# Patient Record
Sex: Male | Born: 1985 | Race: Black or African American | Hispanic: No | Marital: Single | Smoking: Never smoker
Health system: Southern US, Community
[De-identification: ages and names within clinical notes are randomized; demographics above are authoritative.]

## PROBLEM LIST (undated history)

## (undated) DIAGNOSIS — F29 Unspecified psychosis not due to a substance or known physiological condition: Secondary | ICD-10-CM

## (undated) DIAGNOSIS — G4730 Sleep apnea, unspecified: Secondary | ICD-10-CM

## (undated) DIAGNOSIS — F819 Developmental disorder of scholastic skills, unspecified: Secondary | ICD-10-CM

## (undated) DIAGNOSIS — F419 Anxiety disorder, unspecified: Secondary | ICD-10-CM

## (undated) DIAGNOSIS — K219 Gastro-esophageal reflux disease without esophagitis: Secondary | ICD-10-CM

## (undated) DIAGNOSIS — F329 Major depressive disorder, single episode, unspecified: Secondary | ICD-10-CM

## (undated) DIAGNOSIS — E669 Obesity, unspecified: Secondary | ICD-10-CM

## (undated) DIAGNOSIS — F060 Psychotic disorder with hallucinations due to known physiological condition: Secondary | ICD-10-CM

## (undated) DIAGNOSIS — R51 Headache: Secondary | ICD-10-CM

## (undated) DIAGNOSIS — F988 Other specified behavioral and emotional disorders with onset usually occurring in childhood and adolescence: Secondary | ICD-10-CM

## (undated) DIAGNOSIS — B354 Tinea corporis: Secondary | ICD-10-CM

## (undated) DIAGNOSIS — K2970 Gastritis, unspecified, without bleeding: Secondary | ICD-10-CM

## (undated) DIAGNOSIS — Z9289 Personal history of other medical treatment: Secondary | ICD-10-CM

## (undated) DIAGNOSIS — E041 Nontoxic single thyroid nodule: Secondary | ICD-10-CM

## (undated) DIAGNOSIS — F32A Depression, unspecified: Secondary | ICD-10-CM

## (undated) DIAGNOSIS — Z973 Presence of spectacles and contact lenses: Secondary | ICD-10-CM

## (undated) HISTORY — DX: Gastro-esophageal reflux disease without esophagitis: K21.9

## (undated) HISTORY — DX: Major depressive disorder, single episode, unspecified: F32.9

## (undated) HISTORY — DX: Depression, unspecified: F32.A

## (undated) HISTORY — DX: Other specified behavioral and emotional disorders with onset usually occurring in childhood and adolescence: F98.8

## (undated) HISTORY — DX: Anxiety disorder, unspecified: F41.9

## (undated) HISTORY — DX: Presence of spectacles and contact lenses: Z97.3

## (undated) HISTORY — DX: Developmental disorder of scholastic skills, unspecified: F81.9

## (undated) HISTORY — DX: Personal history of other medical treatment: Z92.89

## (undated) HISTORY — DX: Sleep apnea, unspecified: G47.30

## (undated) HISTORY — DX: Tinea corporis: B35.4

## (undated) HISTORY — DX: Gastritis, unspecified, without bleeding: K29.70

## (undated) HISTORY — DX: Obesity, unspecified: E66.9

## (undated) HISTORY — DX: Headache: R51

---
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
## 2006-06-30 ENCOUNTER — Ambulatory Visit: Payer: Self-pay | Admitting: Family Medicine

## 2006-08-05 ENCOUNTER — Ambulatory Visit: Payer: Self-pay | Admitting: Family Medicine

## 2006-10-21 ENCOUNTER — Ambulatory Visit: Payer: Self-pay | Admitting: Family Medicine

## 2006-12-22 ENCOUNTER — Ambulatory Visit: Payer: Self-pay | Admitting: Family Medicine

## 2007-01-15 ENCOUNTER — Ambulatory Visit: Payer: Self-pay | Admitting: Family Medicine

## 2007-03-24 ENCOUNTER — Ambulatory Visit: Payer: Self-pay | Admitting: Family Medicine

## 2007-04-13 ENCOUNTER — Ambulatory Visit: Payer: Self-pay | Admitting: Family Medicine

## 2007-07-30 ENCOUNTER — Ambulatory Visit: Payer: Self-pay | Admitting: Family Medicine

## 2007-10-14 ENCOUNTER — Ambulatory Visit: Payer: Self-pay | Admitting: Family Medicine

## 2007-10-19 DIAGNOSIS — Z9289 Personal history of other medical treatment: Secondary | ICD-10-CM

## 2007-10-19 HISTORY — DX: Personal history of other medical treatment: Z92.89

## 2007-11-09 ENCOUNTER — Ambulatory Visit (HOSPITAL_COMMUNITY): Admission: RE | Admit: 2007-11-09 | Discharge: 2007-11-09 | Payer: Self-pay | Admitting: Cardiovascular Disease

## 2007-11-09 ENCOUNTER — Ambulatory Visit: Payer: Self-pay | Admitting: Cardiovascular Disease

## 2007-11-12 ENCOUNTER — Encounter: Payer: Self-pay | Admitting: Cardiovascular Disease

## 2007-11-12 ENCOUNTER — Ambulatory Visit: Payer: Self-pay

## 2008-02-10 ENCOUNTER — Ambulatory Visit: Payer: Self-pay | Admitting: Family Medicine

## 2008-04-21 ENCOUNTER — Encounter: Admission: RE | Admit: 2008-04-21 | Discharge: 2008-04-21 | Payer: Self-pay | Admitting: Family Medicine

## 2008-06-14 ENCOUNTER — Ambulatory Visit: Payer: Self-pay | Admitting: Family Medicine

## 2008-09-06 ENCOUNTER — Ambulatory Visit: Payer: Self-pay | Admitting: Family Medicine

## 2008-10-13 ENCOUNTER — Ambulatory Visit: Payer: Self-pay | Admitting: Family Medicine

## 2009-02-21 ENCOUNTER — Ambulatory Visit: Payer: Self-pay | Admitting: Family Medicine

## 2009-03-14 ENCOUNTER — Ambulatory Visit: Payer: Self-pay | Admitting: Family Medicine

## 2009-05-22 ENCOUNTER — Ambulatory Visit: Payer: Self-pay | Admitting: Family Medicine

## 2010-02-01 ENCOUNTER — Ambulatory Visit: Payer: Self-pay | Admitting: Physician Assistant

## 2010-05-23 ENCOUNTER — Ambulatory Visit
Admission: RE | Admit: 2010-05-23 | Discharge: 2010-05-23 | Payer: Self-pay | Source: Home / Self Care | Attending: Family Medicine | Admitting: Family Medicine

## 2010-07-04 ENCOUNTER — Ambulatory Visit (INDEPENDENT_AMBULATORY_CARE_PROVIDER_SITE_OTHER): Payer: Federal, State, Local not specified - PPO | Admitting: Family Medicine

## 2010-07-04 DIAGNOSIS — R5381 Other malaise: Secondary | ICD-10-CM

## 2010-10-02 NOTE — Assessment & Plan Note (Signed)
[facility]                            CARDIOLOGY OFFICE NOTE   NAME:[name]                   MRN:          [id number]  DATE:11/09/2007                            DOB:          1985-11-18    A 25 year old patient referred by Dr. Meredeth Ide for chest pain, question  low heart rate, and dizziness.   The patient has no previously documented congenital or cardiac disease.  He has a history of probable adult-onset attention deficit disorder.   He has been on increasing doses of Focalin.   The patient has been having some dizziness.  It is non postural.  He  points towards his head and indicates that he has swimmy headed feeling.  He has not had any significant passing out spells and they are not  associated with palpitations.  He also gets atypical chest pain.  He  describes a grabbing sensation over his left prepectoral area.  In fact,  while he was in the room, he basically clutched at his chest and  indicated he was having a spasm.   The pain has been going on for a few months.  He thinks it may have been  exacerbated by the Focalin.   They clearly are not exertional related.   He also gets occasional exertional dyspnea which seems functional.  He  is a nonsmoker.  He has no cough, no sputum production, and no history  of chronic lung disease.  However, his father thinks that both the  patient's symptoms and his own have been made worse since they moved to  a new apartment in April and it is not clear if they have had any toxic  exposure or exposure to mold.   REVIEW OF SYSTEMS:  Otherwise negative.   I reviewed his EKG done at Dr. Reita Cliche office.  He basically had sinus  bradycardia with no heart block at a rate of 52.  There were no  abnormalities.  His PR interval was 188 milliseconds, QRS was 96  milliseconds, and QT was 380.   I told the patient I thought this was a normal EKG.   PAST MEDICAL HISTORY:  Otherwise remarkable  for attention deficit  disorder.  He has had constipation, fatigue, and seasonal allergies.  He  has not had any previous surgeries.   SOCIAL HISTORY:  The patient is single.  He lives with his father.  He  described himself as a Neurosurgeon.  He seems to have difficulty  with school.  He says he is going back there in the fall.  He does not  smoke or drink.  Mother and father are still alive without premature  congenital heart disease.   MEDICATION:  His only meds include Focalin 20 mg a day.   PHYSICAL EXAMINATION:  GENERAL:  Remarkable for an overweight black  male, in no distress.  Affect is somewhat unusual.  VITAL SIGNS:  His weight is 282, blood pressure 124/78, pulse 82 and  regular, respiratory rate 14, and afebrile.  HEENT:  Unremarkable.  NECK:  Carotids are normal without bruit.  No lymphadenopathy,  thyromegaly,  or JVP elevation.  LUNGS:  Clear with good diaphragmatic motion.  No wheezing.  CARDIAC: S1 and S2 with normal heart sounds.  PMI normal.  ABDOMEN:  Benign.  Bowel sounds positive.  No AAA.  No bruit.  No  hepatosplenomegaly.  No hepatojugular reflux and tenderness.  EXTREMITIES: Distal pulses intact. No edema.  NEURO:  Nonfocal.  SKIN:  Warm and dry.  No muscular weakness.   EKG is normal with a heart rate of 64.   IMPRESSION:  1. Relative bradycardia, not significant.  No evidence of heart block.      We will do a treadmill test on the patient to make sure there is no      chronotropic incompetence, but I do not think he has any abnormal      bradycardia.  2. Attention deficit disorder.  I think this may have exacerbated the      tightness in his chest.  We will leave it up to his primary care MD      to see if there are alternatives to an amphetamine-based compound.      He will have a stress echo to rule out structural heart disease and      make sure there is no possibility of congenital arterial disease.  3. Dizziness, again unlikely related to  his heart.  No evidence of      significant arrhythmias.  A 2-D echocardiogram to rule out occult      hypertrophic cardiomyopathy or other structural heart disease.   Since he was having symptoms referable to his chest which were  asymptomatic, I  think we will also do a screening chest x-ray.  He has  not had one in quite some time and I would like to make sure that there  is no other evidence of problems such as sarcoid or hilar adenopathy.   As long as his chest x-ray and stress echo were normal, he will be seen  on an as-needed basis and follow up with Dr. Meredeth Ide for his attention  deficit disorder.     Noralyn Pick. Eden Emms, MD, [facility]  Electronically Signed    PCN/MedQ  DD: 11/09/2007  DT: 11/10/2007  Job #: [id number]

## 2010-10-10 ENCOUNTER — Ambulatory Visit (INDEPENDENT_AMBULATORY_CARE_PROVIDER_SITE_OTHER): Payer: Federal, State, Local not specified - PPO | Admitting: Medical

## 2010-10-10 ENCOUNTER — Encounter: Payer: Self-pay | Admitting: Medical

## 2010-10-10 VITALS — BP 132/72 | HR 72 | Temp 97.5°F | Ht 73.0 in | Wt 261.0 lb

## 2010-10-10 DIAGNOSIS — R358 Other polyuria: Secondary | ICD-10-CM

## 2010-10-10 DIAGNOSIS — R3589 Other polyuria: Secondary | ICD-10-CM

## 2010-10-10 DIAGNOSIS — R632 Polyphagia: Secondary | ICD-10-CM

## 2010-10-10 DIAGNOSIS — J029 Acute pharyngitis, unspecified: Secondary | ICD-10-CM

## 2010-10-10 DIAGNOSIS — R631 Polydipsia: Secondary | ICD-10-CM

## 2010-10-10 LAB — POCT URINALYSIS DIPSTICK
Bilirubin, UA: NEGATIVE
Leukocytes, UA: NEGATIVE
Nitrite, UA: NEGATIVE
Spec Grav, UA: 1.015
Urobilinogen, UA: 4

## 2010-10-10 LAB — POCT RAPID STREP A (OFFICE): Rapid Strep A Screen: NEGATIVE

## 2010-10-10 NOTE — Progress Notes (Signed)
Subjective:     [name] is a 25 y.o. male who presents for evaluation of sore throat. Associated symptoms include dry cough, post nasal drip, sore throat and Nasal congestion. Onset of symptoms was 4 days ago, and have been unchanged since that time. He is drinking plenty of fluids. He has not had a recent close exposure to someone with proven streptococcal pharyngitis.  He also has a concern that lately he has been very hungry, he has had increased urination, increased bowel movement, increased thirst and hunger.  He is under stress.  He is treated by Dr. Betti Cruz for depression.    He also brought forms that need signing and sent to his college.    The following portions of the patient's history were reviewed and updated as appropriate: allergies, current medications, past family history, past medical history, past social history, past surgical history and problem list.  Review of Systems Constitutional: denies fever, chills, sweats, unexpected weight change, anorexia, fatigue Allergy: denies recent sneezing, itching Dermatology: denies rash ENT: no ear pain, sinus pain, teeth pain Cardiology: denies chest pain, palpitations Respiratory: denies shortness of breath, wheezing,  Gastroenterology: denies abdominal pain, nausea, vomiting, diarrhea Musculoskeletal: denies arthralgias, myalgias, joint swelling, back pain, neck pain Ophthalmology: denies eye redness, itching, discharge    Objective:     Filed Vitals:   10/10/10 1355  BP: 132/72  Pulse: 72  Temp: 97.5 F (36.4 C)    General appearance: no distress, WD/WN HEENT: normocephalic, conjunctiva/corneas normal, sclerae anicteric, nares patent, no discharge or erythema, pharynx with mild erythema, no exudate.  Oral cavity: MMM, no lesions  Neck: supple, no lymphadenopathy, no thyromegaly Heart: RRR, normal S1, S2, no murmurs Lungs: CTA bilaterally, no wheezes, rhonchi, or rales Abdomen: +bs, soft, non tender, non  distended, no masses, no hepatomegaly, no splenomegaly Musculoskeletal: non tender, no obvious deformity of extremities  Laboratory Strep test done. Results:negative.    Assessment:   Encounter Diagnoses  Name Primary?  . Pharyngitis Yes  . Polyuria   . Polydipsia   . Polyphagia     Plan:     Pharyngitis - Advised that symptoms and exam suggest a viral etiology.  Discussed symptomatic treatment including salt water gargles, warm fluids, rest, hydrate well, can use over-the-counter Tylenol for throat pain, fever, or malaise. If worse or not improving within 2-3 days, call or return.   Polyuria, polydipsia, polyphagia - advised that his HgbA1C diabetes screen was not diagnostic of diabetes, no glucosuria in the urinalysis.  Advised he try and use more caution on his oral intake.   He is over eating in general, and he also eats with stressors.  He has seen [facility] prior.    I completed his forms today for school.  We will fax the forms as well to the appropriate contact.

## 2011-01-12 ENCOUNTER — Emergency Department (HOSPITAL_COMMUNITY)
Admission: EM | Admit: 2011-01-12 | Discharge: 2011-01-12 | Disposition: A | Discharge status: Home / Self Care | Payer: BC Managed Care – PPO | Attending: Emergency Medicine | Admitting: Emergency Medicine

## 2011-01-12 DIAGNOSIS — R21 Rash and other nonspecific skin eruption: Secondary | ICD-10-CM | POA: Insufficient documentation

## 2011-01-12 DIAGNOSIS — R51 Headache: Secondary | ICD-10-CM | POA: Insufficient documentation

## 2011-01-14 ENCOUNTER — Encounter: Payer: Self-pay | Admitting: Family Medicine

## 2011-01-14 ENCOUNTER — Ambulatory Visit (INDEPENDENT_AMBULATORY_CARE_PROVIDER_SITE_OTHER): Payer: BC Managed Care – PPO | Admitting: Family Medicine

## 2011-01-14 VITALS — BP 120/80 | HR 75 | Temp 98.5°F | Wt 263.0 lb

## 2011-01-14 DIAGNOSIS — L0390 Cellulitis, unspecified: Secondary | ICD-10-CM

## 2011-01-14 DIAGNOSIS — L0291 Cutaneous abscess, unspecified: Secondary | ICD-10-CM

## 2011-01-14 NOTE — Patient Instructions (Signed)
Use Neosporin ointment on this as needed.

## 2011-01-14 NOTE — Progress Notes (Signed)
  Subjective:    Patient ID: [name], male    DOB: 07-14-1985, 25 y.o.   MRN: [id number]  HPI Approximately 3 days ago he noted spine and of the lower lip as well as some irritation and redness. He states that the area is getting better. He notes no other oral lesions or lesions on the penis.   Review of Systems     Objective:   Physical Exam Alert and in no distress. Lower lip is edematous, erythematous and shows evidence of healing. Exam of the wrist the oral mucosa was entirely normal. Neck is supple without adenopathy.       Assessment & Plan:  Healing lip lesion, etiology unclear Since he is doing well I will recommend using an antibiotic ointment on this and call if further trouble.

## 2011-02-07 ENCOUNTER — Telehealth: Payer: Self-pay | Admitting: Family Medicine

## 2011-02-07 MED ORDER — LISDEXAMFETAMINE DIMESYLATE 60 MG PO CAPS: 60.0000 mg | ORAL_CAPSULE | ORAL | Status: DC

## 2011-02-07 NOTE — Telephone Encounter (Signed)
Vyvanse prescription renewed

## 2011-02-08 ENCOUNTER — Telehealth: Payer: Self-pay | Admitting: Family Medicine

## 2011-02-11 NOTE — Telephone Encounter (Signed)
LEFT MESSAGE

## 2011-02-12 ENCOUNTER — Encounter: Payer: Self-pay | Admitting: Family Medicine

## 2011-02-15 ENCOUNTER — Ambulatory Visit (INDEPENDENT_AMBULATORY_CARE_PROVIDER_SITE_OTHER): Payer: BC Managed Care – PPO | Admitting: Family Medicine

## 2011-02-15 ENCOUNTER — Encounter: Payer: Self-pay | Admitting: Family Medicine

## 2011-02-15 VITALS — BP 120/80 | HR 64 | Wt 264.0 lb

## 2011-02-15 DIAGNOSIS — F909 Attention-deficit hyperactivity disorder, unspecified type: Secondary | ICD-10-CM

## 2011-02-15 MED ORDER — LISDEXAMFETAMINE DIMESYLATE 70 MG PO CAPS: 70.0000 mg | ORAL_CAPSULE | ORAL | Status: DC

## 2011-02-15 NOTE — Progress Notes (Signed)
  Subjective:    Patient ID: [name], male    DOB: 1986-02-06, 25 y.o.   MRN: [id number]  HPI He is here to discuss his ADD medication. It is difficult to get a good history from him but apparently the Diovan/only about 4 hours. He states that he has difficulty remembering things which interferes with his course work.  Review of Systems     Objective:   Physical Exam Alert and in no distress otherwise not examined       Assessment & Plan:  ADD I will increase his Vyvanse. Discussed the possibility of giving him Adderall to help if he needs an extra boost to help with focused. Also discussed the need for him to do all of his course work and studying in the time frame that the Vyvanse is in the system. He will call me next week.

## 2011-02-15 NOTE — Patient Instructions (Signed)
Try the new dosing and let me know if it works, how long does it work and are you having trouble

## 2011-04-04 ENCOUNTER — Telehealth: Payer: Self-pay | Admitting: Family Medicine

## 2011-04-04 MED ORDER — LISDEXAMFETAMINE DIMESYLATE 70 MG PO CAPS: 70.0000 mg | ORAL_CAPSULE | ORAL | Status: DC

## 2011-04-04 NOTE — Telephone Encounter (Signed)
VYVANSE RENEWED

## 2011-05-02 ENCOUNTER — Telehealth: Payer: Self-pay | Admitting: Family Medicine

## 2011-05-03 NOTE — Telephone Encounter (Signed)
LETTER GIVEN TO PT

## 2011-05-06 ENCOUNTER — Encounter: Payer: Self-pay | Admitting: Family Medicine

## 2011-05-24 ENCOUNTER — Ambulatory Visit (INDEPENDENT_AMBULATORY_CARE_PROVIDER_SITE_OTHER): Payer: Medicaid Other | Admitting: Medical

## 2011-05-24 ENCOUNTER — Encounter: Payer: Self-pay | Admitting: Medical

## 2011-05-24 VITALS — BP 122/80 | Temp 97.5°F | Wt 270.0 lb

## 2011-05-24 DIAGNOSIS — R05 Cough: Secondary | ICD-10-CM

## 2011-05-24 DIAGNOSIS — R059 Cough, unspecified: Secondary | ICD-10-CM | POA: Insufficient documentation

## 2011-05-24 DIAGNOSIS — J029 Acute pharyngitis, unspecified: Secondary | ICD-10-CM

## 2011-05-24 LAB — POCT RAPID STREP A (OFFICE): Rapid Strep A Screen: NEGATIVE

## 2011-05-24 MED ORDER — HYDROCODONE-HOMATROPINE 5-1.5 MG/5ML PO SYRP: 5.0000 mL | ORAL_SOLUTION | Freq: Four times a day (QID) | ORAL | Status: DC | PRN

## 2011-05-24 NOTE — Progress Notes (Signed)
Subjective:  [name] is a 26 y.o. male who presents for c/o 3 week hx/o sore throat, swollen throat, and been using Theraflu and liquid Tylenol.  Denies fever.  Is coughing a lot, up all night coughing.  Denies head congestion or ear pain, but does feel congestion and pain in neck.   No sick contacts.  He has not had a recent close exposure to someone with proven streptococcal pharyngitis.  Past Medical History  Diagnosis Date  . Chronic headaches   . Learning disability   . ADD (attention deficit disorder)   . Depression    ROS: Gen: no fever, chills, wt loss HEENT: no ear pain, headache or sinus pressure GI: no abdominal pain, N/V/D   Objective:      Filed Vitals:   05/24/11 1350  BP: 122/80  Temp: 97.5 F (36.4 C)    General appearance: no distress, WD/WN HEENT: normocephalic, conjunctiva/corneas normal, sclerae anicteric, nares patent, no discharge or erythema, pharynx with erythema, +post nasal drainage  Oral cavity: MMM, no lesions  Neck: mildly tender generalized, otherwise supple, no lymphadenopathy, no thyromegaly Heart: RRR, normal S1, S2, no murmurs Lungs: CTA bilaterally, no wheezes, rhonchi, or rales  Laboratory Strep test done. Results:negative.    Assessment and Plan:   Encounter Diagnoses  Name Primary?  . Pharyngitis Yes  . Cough     Advised that symptoms and exam suggest cough and sore throat from post nasal drip.  Begin Zyrtec 10mg  QHS OTC.  Script for Hycodan for cough.  Advised salt water gargles, warm fluids, rest, hydrate well, can use over-the-counter Tylenol or Ibuprofen for throat pain, fever, or malaise. If worse or not improving within 2-3 days, call or return. Otherwise call report 1 wk.

## 2011-05-24 NOTE — Patient Instructions (Signed)

## 2011-06-18 ENCOUNTER — Telehealth: Payer: Self-pay | Admitting: Internal Medicine

## 2011-06-18 MED ORDER — LISDEXAMFETAMINE DIMESYLATE 70 MG PO CAPS: 70.0000 mg | ORAL_CAPSULE | ORAL | Status: DC

## 2011-06-18 NOTE — Telephone Encounter (Signed)
Vyvanse was renewed

## 2011-06-19 ENCOUNTER — Telehealth: Payer: Self-pay | Admitting: Family Medicine

## 2011-06-19 NOTE — Telephone Encounter (Signed)
Talked with father he said ok

## 2011-06-19 NOTE — Telephone Encounter (Signed)
Review of his record indicates he shouldn't be on the medication and I cannot give him another prescription

## 2011-06-20 ENCOUNTER — Other Ambulatory Visit: Payer: Self-pay | Admitting: Family Medicine

## 2011-06-20 MED ORDER — LISDEXAMFETAMINE DIMESYLATE 70 MG PO CAPS: 70.0000 mg | ORAL_CAPSULE | ORAL | Status: DC

## 2011-06-20 NOTE — Progress Notes (Signed)
His last Vyvanse prescription was filled November 16. Apparently he did not fill the other medications that would have covered him until February 15. Since he is now presently out, I will cover him with a 15 day supply and have already written for the next 3 months.

## 2011-06-24 ENCOUNTER — Telehealth: Payer: Self-pay | Admitting: Family Medicine

## 2011-06-24 NOTE — Telephone Encounter (Signed)
SPOKE WITH PT & INFORMED RX'S VYVANSE READY TO PICK UP

## 2012-01-07 ENCOUNTER — Ambulatory Visit (INDEPENDENT_AMBULATORY_CARE_PROVIDER_SITE_OTHER): Payer: BC Managed Care – PPO | Admitting: Medical

## 2012-01-07 ENCOUNTER — Encounter: Payer: Self-pay | Admitting: Medical

## 2012-01-07 VITALS — BP 120/88 | HR 92 | Temp 98.0°F | Resp 18 | Wt 265.0 lb

## 2012-01-07 DIAGNOSIS — B350 Tinea barbae and tinea capitis: Secondary | ICD-10-CM

## 2012-01-07 DIAGNOSIS — F988 Other specified behavioral and emotional disorders with onset usually occurring in childhood and adolescence: Secondary | ICD-10-CM

## 2012-01-07 DIAGNOSIS — J069 Acute upper respiratory infection, unspecified: Secondary | ICD-10-CM

## 2012-01-07 DIAGNOSIS — L738 Other specified follicular disorders: Secondary | ICD-10-CM

## 2012-01-07 DIAGNOSIS — R21 Rash and other nonspecific skin eruption: Secondary | ICD-10-CM

## 2012-01-07 MED ORDER — LISDEXAMFETAMINE DIMESYLATE 70 MG PO CAPS: 70.0000 mg | ORAL_CAPSULE | ORAL | Status: DC

## 2012-01-07 MED ORDER — CLINDAMYCIN PHOSPHATE 1 % EX GEL: Freq: Two times a day (BID) | CUTANEOUS | Status: DC

## 2012-01-07 NOTE — Progress Notes (Signed)
Subjective:  [name] is a 26 y.o. male who presents for illness.  He reports 3-4 days of cough, sneezing, sore throat, subjective fever, some ear pain, sinus pressure, but denies NVD.  Cough is productive of yellow sputum.   Using nothing for symptoms.  Feels fatigue, chest and head feels achy.  No chills.  Nephew recently diagnosed with strep but he was only around him briefly.  No other aggravating or relieving factors.    He c/o rash on face and both sides of nose.  Using nothing for this.  He needs refill on his ADD medication.  He is doing fine on this, he is in school and this helps with focus and attention.  Past Medical History  Diagnosis Date  . Chronic headaches   . Learning disability   . ADD (attention deficit disorder)   . Depression    ROS as noted in HPI   Objective:   Filed Vitals:   01/07/12 1529  BP: 120/88  Pulse: 92  Temp: 98 F (36.7 C)  Resp: 18    General appearance: Alert, WD/WN, no distress, mildly ill appearing                             Skin: neck with diffuse rough texture and numerous papular lesions suggestive of folliculitis, bilat cheeks and face just lateral to nose with scattered round areas of hypopigmented                            Head: no sinus tenderness                            Eyes: conjunctiva normal, corneas clear, PERRLA                            Ears: pearly TMs, external ear canals normal                          Nose: septum midline, turbinates swollen, with erythema and clear discharge             Mouth/throat: MMM, tongue normal, mild pharyngeal erythema                           Neck: supple, no adenopathy, no thyromegaly, nontender                          Heart: RRR, normal S1, S2, no murmurs                         Lungs: CTA bilaterally, no wheezes, rales, or rhonchi     Assessment and Plan:   Encounter Diagnoses  Name Primary?  . URI (upper respiratory infection) Yes  . Folliculitis barbae   . Rash     . ADD (attention deficit disorder)     URI - Discussed diagnosis and treatment of URI.  Suggested symptomatic OTC remedies.  Nasal saline spray for congestion.  Tylenol or Ibuprofen OTC for fever and malaise.  Call/return in 2-3 days if symptoms aren't resolving.   Folliculitis barbae - begin BID soap wash and rinse, script for Clindamycin gel, recheck 3mo  Rash - possible tinea  corporis.  Advised OTC selsun blue shampoo to lather in scalp and on face and torso, leave in , rinse out, can repeat this regimen twice weekly.  If not improving in a week.    ADD - doing well on current medication.   Refilled Vyvanse 70mg  daily.

## 2012-01-07 NOTE — Patient Instructions (Signed)
Upper Respiratory Infection, Adult An upper respiratory infection (URI) is also known as the common cold. It is often caused by a type of germ (virus). Colds are easily spread (contagious). You can pass it to others by kissing, coughing, sneezing, or drinking out of the same glass. Usually, you get better in 1 or 2 weeks.  HOME CARE   Only take medicine as told by your doctor.   Use a warm mist humidifier or breathe in steam from a hot shower.   Drink enough water and fluids to keep your pee (urine) clear or pale yellow.   Get plenty of rest.   Return to work when your temperature is back to normal or as told by your doctor. You may use a face mask and wash your hands to stop your cold from spreading.   Consider Mucinex DM OTC for cough and congestion  Ibuprofen for fever, throat pain and aches  Nasal saline for nasal congestion  Salt water gargles GET HELP RIGHT AWAY IF:   After the first few days, you feel you are getting worse.   You have questions about your medicine.   You have chills, shortness of breath, or brown or red spit (mucus).   You have yellow or brown snot (nasal discharge) or pain in the face, especially when you bend forward.   You have a fever, puffy (swollen) neck, pain when you swallow, or white spots in the back of your throat.   You have a bad headache, ear pain, sinus pain, or chest pain.   You have a high-pitched whistling sound when you breathe in and out (wheezing).   You have a lasting cough or cough up blood.   You have sore muscles or a stiff neck.  MAKE SURE YOU:   Understand these instructions.   Will watch your condition.   Will get help right away if you are not doing well or get worse.  Document Released: 10/23/2007 Document Revised: 01/16/2011 Document Reviewed: 09/10/2010 [facility] Patient Information 2012 [place], [place].     Folliculitis Barbae - rash on neck.  Wash neck and face twice daily with mild soap and water.  Then  apply Clindagel twice daily to the neck.     For face rash, begin Selsun Blue shampoo, use this twice weekly.  Massage in scalp and on face, leave on for 10 minutes, then wash off.

## 2012-02-03 ENCOUNTER — Encounter: Payer: Self-pay | Admitting: Internal Medicine

## 2012-02-10 ENCOUNTER — Ambulatory Visit (INDEPENDENT_AMBULATORY_CARE_PROVIDER_SITE_OTHER): Payer: Medicaid Other | Admitting: Medical

## 2012-02-10 ENCOUNTER — Encounter: Payer: Self-pay | Admitting: Medical

## 2012-02-10 VITALS — BP 130/70 | HR 72 | Temp 98.1°F | Resp 16 | Wt 262.0 lb

## 2012-02-10 DIAGNOSIS — J029 Acute pharyngitis, unspecified: Secondary | ICD-10-CM

## 2012-02-10 DIAGNOSIS — R05 Cough: Secondary | ICD-10-CM

## 2012-02-10 DIAGNOSIS — Z113 Encounter for screening for infections with a predominantly sexual mode of transmission: Secondary | ICD-10-CM

## 2012-02-10 MED ORDER — HYDROCODONE-HOMATROPINE 5-1.5 MG/5ML PO SYRP: 5.0000 mL | ORAL_SOLUTION | Freq: Four times a day (QID) | ORAL | Status: AC | PRN

## 2012-02-10 MED ORDER — AMOXICILLIN 875 MG PO TABS: 875.0000 mg | ORAL_TABLET | Freq: Two times a day (BID) | ORAL | Status: DC

## 2012-02-11 ENCOUNTER — Encounter: Payer: Self-pay | Admitting: Medical

## 2012-02-11 LAB — GC/CHLAMYDIA PROBE AMP, URINE: GC Probe Amp, Urine: NEGATIVE

## 2012-02-11 LAB — HIV ANTIBODY (ROUTINE TESTING W REFLEX): HIV: NONREACTIVE

## 2012-02-11 NOTE — Progress Notes (Signed)
Subjective:   HPI  [name] is a 26 y.o. male who presents with 2 wk hx/o bad sore throat, irritated throat causing him to have bad coughing spells, feels congestion in throat, headache. Using salt water gargles and zyrtec.  Denies fever, NVD, no productive cough, no ear pain, no sinus pressure, no allergy symptoms otherwise.   No sick contacts.   He would like routine STD screening.  Not currently sexually active, but has had prior male partner.  No current symptoms or concerns.  No hx/o STD.  No other aggravating or relieving factors.    No other c/o.  The following portions of the patient's history were reviewed and updated as appropriate: allergies, current medications, past family history, past medical history, past social history, past surgical history and problem list.  Past Medical History  Diagnosis Date  . Chronic headaches   . Learning disability   . ADD (attention deficit disorder)   . Depression     No Known Allergies   Review of Systems ROS reviewed and was negative other than noted in HPI or above.    Objective:   Physical Exam  General appearance: alert, no distress, WD/WN HEENT: normocephalic, sclerae anicteric, TMs pearly, nares patent, no discharge or erythema, pharynx with moderate erythema, no exudate Oral cavity: MMM, no lesions Neck: supple, shoddy tender lymphadenopathy, no thyromegaly, no masses Heart: RRR, normal S1, S2, no murmurs Lungs: CTA bilaterally, no wheezes, rhonchi, or rales   Assessment and Plan :     Encounter Diagnoses  Name Primary?  . Pharyngitis Yes  . Cough   . Screen for STD (sexually transmitted disease)    Given exam findings and hx/o, we will go ahead with course of Amoxicillin, hycodan prn for cough, rest, hydrate well, and call if not improving.   STD screening today, discussed safe sex, prevention.

## 2012-02-12 ENCOUNTER — Encounter: Payer: Self-pay | Admitting: Family Medicine

## 2012-03-25 ENCOUNTER — Telehealth: Payer: Self-pay | Admitting: Internal Medicine

## 2012-03-25 NOTE — Telephone Encounter (Signed)
Have him set up an appointment for his ADD med

## 2012-03-26 NOTE — Telephone Encounter (Signed)
Called pt # left message for him to please make appt to get refill on meds

## 2012-03-27 ENCOUNTER — Encounter: Payer: Self-pay | Admitting: Family Medicine

## 2012-03-27 ENCOUNTER — Ambulatory Visit (INDEPENDENT_AMBULATORY_CARE_PROVIDER_SITE_OTHER): Payer: BC Managed Care – PPO | Admitting: Family Medicine

## 2012-03-27 VITALS — BP 124/80 | HR 71 | Wt 263.0 lb

## 2012-03-27 DIAGNOSIS — F909 Attention-deficit hyperactivity disorder, unspecified type: Secondary | ICD-10-CM

## 2012-03-27 MED ORDER — AMPHETAMINE-DEXTROAMPHETAMINE 20 MG PO TABS: 20.0000 mg | ORAL_TABLET | Freq: Two times a day (BID) | ORAL | Status: DC

## 2012-03-27 NOTE — Progress Notes (Signed)
  Subjective:    Patient ID: [name], male    DOB: 06-20-1985, 26 y.o.   MRN: [id number]  HPI He is here for consultation concerning his ADD. He states that the medicine only lasts approximately one hour. It is difficult to get a good history from him as to exactly why over the last one hour other than he says he has difficulty with memory   Review of Systems     Objective:   Physical Exam  Alert and in no distress otherwise not examined      Assessment & Plan:   1. ADHD (attention deficit hyperactivity disorder)  amphetamine-dextroamphetamine (ADDERALL) 20 MG tablet   I will switch him to Adderall 20 mg. He is to let me know if it works, how long it works and if he has any difficulty with it.

## 2012-03-27 NOTE — Patient Instructions (Signed)
Take the Adderall and let me know if it works, how long does it work and do you have any trouble with it. You can take the second one if you need to not at night. Call me next week and let me know

## 2012-09-16 ENCOUNTER — Telehealth: Payer: Self-pay | Admitting: Internal Medicine

## 2012-09-16 NOTE — Telephone Encounter (Signed)
Pt father called stating he is getting audited by the state revenue for his taxes about pt being disable. The father needs a note stating that his son has a learning compresnive disability and that he is mentally disable. He was in a life skills program back in high school and the father can not remember the name of the term they used for his diagnosis

## 2012-09-17 ENCOUNTER — Encounter: Payer: Self-pay | Admitting: Family Medicine

## 2012-09-17 ENCOUNTER — Telehealth: Payer: Self-pay | Admitting: Family Medicine

## 2012-09-17 MED ORDER — LISDEXAMFETAMINE DIMESYLATE 70 MG PO CAPS: 70.0000 mg | ORAL_CAPSULE | ORAL | Status: DC

## 2012-09-17 NOTE — Telephone Encounter (Signed)
Tried to call father at both phone numbers, not good.  Documentation he requested for pt is ready.

## 2012-09-17 NOTE — Telephone Encounter (Signed)
Information given to Lafonda Mosses concerning this.

## 2012-09-17 NOTE — Telephone Encounter (Signed)
Pt walked in and requested refill on his ADD meds.  Call when ready [phone]

## 2012-10-26 ENCOUNTER — Encounter: Payer: Self-pay | Admitting: Family Medicine

## 2012-10-26 ENCOUNTER — Ambulatory Visit (INDEPENDENT_AMBULATORY_CARE_PROVIDER_SITE_OTHER): Payer: BC Managed Care – PPO | Admitting: Family Medicine

## 2012-10-26 ENCOUNTER — Telehealth: Payer: Self-pay | Admitting: Family Medicine

## 2012-10-26 VITALS — BP 118/76 | HR 72 | Temp 98.3°F | Ht 73.0 in | Wt 268.0 lb

## 2012-10-26 DIAGNOSIS — R350 Frequency of micturition: Secondary | ICD-10-CM

## 2012-10-26 DIAGNOSIS — R631 Polydipsia: Secondary | ICD-10-CM

## 2012-10-26 DIAGNOSIS — Z0000 Encounter for general adult medical examination without abnormal findings: Secondary | ICD-10-CM

## 2012-10-26 DIAGNOSIS — M545 Low back pain: Secondary | ICD-10-CM

## 2012-10-26 LAB — POCT URINALYSIS DIPSTICK
Bilirubin, UA: NEGATIVE
Protein, UA: NEGATIVE
Spec Grav, UA: 1.025

## 2012-10-26 NOTE — Progress Notes (Signed)
Chief Complaint  Patient presents with  . Back Pain    x 1 week. Drinking a lot of water. Having urinary frequency, no burning.    Started with bilateral back pain 1 week ago.  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  Pain was gradual onset, with no change in activity or injury.  Pain comes and goes, but has some pain every day.  Had some radiation around to this upper stomach the other day.  No triggers of pain--no specific activity or position.  Gets some help from stretching.  Hot shower helped some also.  Never took any medications for the pain.  Goes to the bathroom frequently, both for urine and bowel movements.  He drinks a lot of water. He thinks his "kidneys" hurt more when he doesn't drink a lot of water.  Denies dysuria, hematuria.  Some urgency/frequency, but with large volume.  Denies excessive thirst, just tries to drink a lot, especially if feeling "drained".  Sometimes his throat feels dry, so he needs to drink water. He thinks the thirst and frequent urination have been going on for months, and hasn't gotten any worse/different recently.   Past Medical History  Diagnosis Date  . Chronic headaches   . Learning disability   . ADD (attention deficit disorder)   . Depression    History reviewed. No pertinent past surgical history.  Current Outpatient Prescriptions on File Prior to Visit  Medication Sig Dispense Refill  . amphetamine-dextroamphetamine (ADDERALL) 20 MG tablet Take 1 tablet (20 mg total) by mouth 2 (two) times daily.  60 tablet  0   No current facility-administered medications on file prior to visit.   No Known Allergies  ROS:  Denies fever, nausea, vomiting, diarrhea, skin rash, bleeding, bruising; no urinary hesitancy or weakened stream.  Denies URI symptoms, allergies.  Denies numbness, tingling or radiation of pain into legs.  PHYSICAL EXAM: BP 118/76  Pulse 72  Temp(Src) 98.3 F (36.8 C) (Oral)  Ht 6\' 1"  (1.854 m)   Wt 268 lb (121.564 kg)  BMI 35.37 kg/m2 Well developed male in no distress Back: no spinal tenderness.  Very mild R CVA tenderness, but much more tender over right lumbar paraspinous muscles, with mild spasm. DTR's 2+ and symmetric.  Normal strength, sensation, gait   Glucose 81  Urine dip is normal  ASSESSMENT/PLAN:  Lumbago  Routine general medical examination at a health care facility - Plan: POCT Urinalysis Dipstick  Polydipsia - Plan: Glucose (CBG)  Urinary frequency - Plan: Glucose (CBG)    Heat/stretches/NSAIDs.  Proper lifting reviewed.   F/u prn persistent/worsening symptoms. Reassured re: fluid intake likely contributing to urinary frequency (?med side effect).

## 2012-10-26 NOTE — Patient Instructions (Addendum)
I recommend using heat (heating pad, thermacare patches, hot showers) for your back pain.  Stretches and massage are also helpful. You may use Aleve twice daily (up to 2 tablets twice daily with food) to help with pain and inflammation, to be used for up to a week. Remember to lift from your knees, to lessen the strain on your back.  Return if your back pain is worsening--if you develop numbness/tingling, pain shooting down a leg, or further bowel/bladder problems.  You have no evidence of diabetes.  You likely are urinating more frequently simply because you are drinking so much water/fluid.  Back Pain, Adult Low back pain is very common. About 1 in 5 people have back pain.The cause of low back pain is rarely dangerous. The pain often gets better over time.About half of people with a sudden onset of back pain feel better in just 2 weeks. About 8 in 10 people feel better by 6 weeks.  CAUSES Some common causes of back pain include:  Strain of the muscles or ligaments supporting the spine.  Wear and tear (degeneration) of the spinal discs.  Arthritis.  Direct injury to the back. DIAGNOSIS Most of the time, the direct cause of low back pain is not known.However, back pain can be treated effectively even when the exact cause of the pain is unknown.Answering your caregiver's questions about your overall health and symptoms is one of the most accurate ways to make sure the cause of your pain is not dangerous. If your caregiver needs more information, he or she may order lab work or imaging tests (X-rays or MRIs).However, even if imaging tests show changes in your back, this usually does not require surgery. HOME CARE INSTRUCTIONS For many people, back pain returns.Since low back pain is rarely dangerous, it is often a condition that people can learn to [facility] their own.   Remain active. It is stressful on the back to sit or stand in one place. Do not sit, drive, or stand in one place for  more than 30 minutes at a time. Take short walks on level surfaces as soon as pain allows.Try to increase the length of time you walk each day.  Do not stay in bed.Resting more than 1 or 2 days can delay your recovery.  Do not avoid exercise or work.Your body is made to move.It is not dangerous to be active, even though your back may hurt.Your back will likely heal faster if you return to being active before your pain is gone.  Pay attention to your body when you bend and lift. Many people have less discomfortwhen lifting if they bend their knees, keep the load close to their bodies,and avoid twisting. Often, the most comfortable positions are those that put less stress on your recovering back.  Find a comfortable position to sleep. Use a firm mattress and lie on your side with your knees slightly bent. If you lie on your back, put a pillow under your knees.  Only take over-the-counter or prescription medicines as directed by your caregiver. Over-the-counter medicines to reduce pain and inflammation are often the most helpful.Your caregiver may prescribe muscle relaxant drugs.These medicines help dull your pain so you can more quickly return to your normal activities and healthy exercise.  Put ice on the injured area.  Put ice in a plastic bag.  Place a towel between your skin and the bag.  Leave the ice on for 15-20 minutes, 3-4 times a day for the first 2 to 3  days. After that, ice and heat may be alternated to reduce pain and spasms.  Ask your caregiver about trying back exercises and gentle massage. This may be of some benefit.  Avoid feeling anxious or stressed.Stress increases muscle tension and can worsen back pain.It is important to recognize when you are anxious or stressed and learn ways to manage it.Exercise is a great option. SEEK MEDICAL CARE IF:  You have pain that is not relieved with rest or medicine.  You have pain that does not improve in 1 week.  You have  new symptoms.  You are generally not feeling well. SEEK IMMEDIATE MEDICAL CARE IF:   You have pain that radiates from your back into your legs.  You develop new bowel or bladder control problems.  You have unusual weakness or numbness in your arms or legs.  You develop nausea or vomiting.  You develop abdominal pain.  You feel faint. Document Released: 05/06/2005 Document Revised: 11/05/2011 Document Reviewed: 09/24/2010 [facility] Patient Information 2014 [place], [place].

## 2012-10-26 NOTE — Telephone Encounter (Signed)
PT CAME BE FOR AN APPT WITH DR. Lynelle Doctor. HE ALSO REQUESTED TO PICK UP RX THAT WAS WAITING ON HIM. WHEN HANDED THEM TO HIM HE STATED THAT THESE WERE THE WRONG RX.  HE WAS CHANGED TO ADDERALL. PER DR LALONDE PT WILL NEED AN APPT FOR FOLLOW UP BEFORE THAT CAN BE WRITTEN. PT WAS NOT GIVEN THE THREE VYVANSE RX. ONE DATED 09/21/12,11/01/2012 AND 12/01/2012. THESE RX'S PER J[facility] WERE DESTROYED. PLEASE REMOVE THESE RX'S FROM PT'S RECORD. PT WILL BE ASKED TO SCHEDULE AN APPT WHEN HE CHECKS OUT FROM ACUTE VISIT TODAY.

## 2012-10-28 ENCOUNTER — Encounter: Payer: Self-pay | Admitting: Family Medicine

## 2012-10-28 ENCOUNTER — Ambulatory Visit (INDEPENDENT_AMBULATORY_CARE_PROVIDER_SITE_OTHER): Payer: BC Managed Care – PPO | Admitting: Family Medicine

## 2012-10-28 VITALS — BP 120/80 | HR 72 | Wt 265.0 lb

## 2012-10-28 DIAGNOSIS — F909 Attention-deficit hyperactivity disorder, unspecified type: Secondary | ICD-10-CM

## 2012-10-28 MED ORDER — AMPHETAMINE-DEXTROAMPHETAMINE 20 MG PO TABS: 20.0000 mg | ORAL_TABLET | Freq: Every day | ORAL | Status: DC

## 2012-10-28 NOTE — Progress Notes (Signed)
  Subjective:    Patient ID: [name], male    DOB: 29-Oct-1985, 27 y.o.   MRN: [id number]  HPI He is here for consult concerning ADHD. He finds that the present Adderall dosing last roughly 8 hours. He will occasionally take a second dose if he has a need to stay focused. This usually occurs once or twice per week. He notes when the medicine wears off, he loses his focus. He has no other withdrawal symptoms. Also of note his back pain is getting better   Review of Systems     Objective:   Physical Exam Alert and in no distress otherwise not examined       Assessment & Plan:  ADHD (attention deficit hyperactivity disorder) - Plan: amphetamine-dextroamphetamine (ADDERALL) 20 MG tablet, amphetamine-dextroamphetamine (ADDERALL) 20 MG tablet, amphetamine-dextroamphetamine (ADDERALL) 20 MG tablet

## 2013-01-15 ENCOUNTER — Ambulatory Visit: Payer: Medicaid Other | Admitting: Medical

## 2013-01-15 ENCOUNTER — Ambulatory Visit (INDEPENDENT_AMBULATORY_CARE_PROVIDER_SITE_OTHER): Payer: BC Managed Care – PPO | Admitting: Medical

## 2013-01-15 ENCOUNTER — Encounter: Payer: Self-pay | Admitting: Medical

## 2013-01-15 ENCOUNTER — Ambulatory Visit: Payer: BC Managed Care – PPO | Admitting: Medical

## 2013-01-15 VITALS — BP 112/80 | HR 78 | Temp 98.4°F | Resp 18 | Wt 265.0 lb

## 2013-01-15 DIAGNOSIS — J40 Bronchitis, not specified as acute or chronic: Secondary | ICD-10-CM | POA: Diagnosis not present

## 2013-01-15 DIAGNOSIS — R059 Cough, unspecified: Secondary | ICD-10-CM

## 2013-01-15 DIAGNOSIS — R05 Cough: Secondary | ICD-10-CM

## 2013-01-15 DIAGNOSIS — F909 Attention-deficit hyperactivity disorder, unspecified type: Secondary | ICD-10-CM

## 2013-01-15 MED ORDER — AZITHROMYCIN 250 MG PO TABS: ORAL_TABLET | ORAL | Status: DC

## 2013-01-15 MED ORDER — AMPHETAMINE-DEXTROAMPHETAMINE 20 MG PO TABS: 20.0000 mg | ORAL_TABLET | Freq: Every day | ORAL | Status: DC

## 2013-01-15 MED ORDER — HYDROCODONE-HOMATROPINE 5-1.5 MG/5ML PO SYRP: 5.0000 mL | ORAL_SOLUTION | Freq: Three times a day (TID) | ORAL | Status: DC | PRN

## 2013-01-15 NOTE — Progress Notes (Signed)
Subjective:  [name] is a 27 y.o. male who presents for evaluation of sore throat, 2wk ago.  He has not had a recent close exposure to someone with proven streptococcal pharyngitis.  Associated symptoms include cough x 2 wk.  Used Nyquil but this hasn't helped.   Has had some nausea and vomiting due to cough.  Has had itching in throat, productive cough.  Denies fever, rash, ear pain.  Some +sneezing.  No itchy or watery eyes.   No nasal congestion.  No other aggravating or relieving factors.  Also needs refill on his ADHD medication.  Just saw Dr. Susann Givens for recheck on this recently. No other c/o.  The following portions of the patient's history were reviewed and updated as appropriate: allergies, current medications, past medical history, past social history, past surgical history and problem list.  ROS as in subjective   Objective: Filed Vitals:   01/15/13 1503  BP: 112/80  Pulse: 78  Temp: 98.4 F (36.9 C)    General appearance: no distress, WD/WN, somewhat ill-appearing HEENT: normocephalic, conjunctiva/corneas normal, sclerae anicteric, nares with swollen turbinates, clear discharge and erythema, pharynx with erythema, no exudate.  Oral cavity: MMM, no lesions  Neck: supple, no lymphadenopathy, no thyromegaly, no JVD Heart: RRR, normal S1, S2, no murmurs Lungs: decreased lower fields, +rhonchi, no wheezes, no rales Ext: no edema   Assessment and Plan: Encounter Diagnoses  Name Primary?  . Bronchitis Yes  . Cough   . ADHD (attention deficit hyperactivity disorder)    Chest xray with no obvious pneumonia.  Will send for over read.  Begin zpak, hydrocodone clough syrup prn, Mucinex DM OTC, rest, hydrate well, can call if  worse or not improving within 2-3 days.  refilled his ADHD medication.

## 2013-03-22 ENCOUNTER — Encounter: Payer: Self-pay | Admitting: Family Medicine

## 2013-03-22 ENCOUNTER — Ambulatory Visit (INDEPENDENT_AMBULATORY_CARE_PROVIDER_SITE_OTHER): Payer: BC Managed Care – PPO | Admitting: Family Medicine

## 2013-03-22 VITALS — BP 120/80 | HR 78 | Wt 264.0 lb

## 2013-03-22 DIAGNOSIS — Z23 Encounter for immunization: Secondary | ICD-10-CM | POA: Diagnosis not present

## 2013-03-22 DIAGNOSIS — F909 Attention-deficit hyperactivity disorder, unspecified type: Secondary | ICD-10-CM | POA: Diagnosis not present

## 2013-03-22 MED ORDER — AMPHETAMINE-DEXTROAMPHETAMINE 20 MG PO TABS: 20.0000 mg | ORAL_TABLET | Freq: Every day | ORAL | Status: DC

## 2013-03-22 NOTE — Progress Notes (Signed)
  Subjective:    Patient ID: [name], male    DOB: [birth year], 27 y.o.   MRN: [id number]  HPI He is here for consult concerning his ADD. He states that he doesn't think the medicine is working as well as it should. He states that he gets roughly 4 hours worth of benefit out of it which helps him stay focused and on task. He needs to stay focused for long periods of time on most days.  Review of Systems     Objective:   Physical Exam Alert and in no distress otherwise not examined       Assessment & Plan:  ADHD (attention deficit hyperactivity disorder) - Plan: amphetamine-dextroamphetamine (ADDERALL) 20 MG tablet, amphetamine-dextroamphetamine (ADDERALL) 20 MG tablet, amphetamine-dextroamphetamine (ADDERALL) 20 MG tablet  Need for prophylactic vaccination and inoculation against influenza - Plan: Flu Vaccine QUAD 36+ mos IM  after discussion with him, I will give him twice a day dosing of the Adderall which did give him 8 hours worth the benefits. He will let me know if this allows him to stay focused for that timeframe. Flu shot given with risks and benefits discussed.

## 2013-08-18 ENCOUNTER — Ambulatory Visit (INDEPENDENT_AMBULATORY_CARE_PROVIDER_SITE_OTHER): Payer: BC Managed Care – PPO | Admitting: Family Medicine

## 2013-08-18 ENCOUNTER — Encounter: Payer: Self-pay | Admitting: Family Medicine

## 2013-08-18 VITALS — BP 110/74 | HR 76 | Temp 97.8°F | Ht 73.0 in | Wt 264.0 lb

## 2013-08-18 DIAGNOSIS — R1013 Epigastric pain: Secondary | ICD-10-CM

## 2013-08-18 MED ORDER — ESOMEPRAZOLE MAGNESIUM 40 MG PO CPDR: 40.0000 mg | DELAYED_RELEASE_CAPSULE | Freq: Every day | ORAL | Status: DC

## 2013-08-18 NOTE — Patient Instructions (Signed)
Take the 40mg  Nexium samples--1 capsule about 30 minutes before dinner.  Take these for the full 10 days given, and then if your stomach pain persists, then start taking the 24 hr Nexium sample in the yellow box.  This is the over-the-counter version of Nexium.  If your pain isn't completely resolved in the first 10 days of the higher dose medication, then plan on taking the over-the-counter medication for at least 2 more weeks.  You were given a coupon to save on the cost when you need to buy it yourself from the pharmacy.  Hopefully, with the dietary changes recommended below, and with avoidance of aspirin, anti-inflammatories, you won't have an ongoing problem.  Return in 2-3 weeks if your pain does not completely resolve.  Return sooner if you have worsening symptoms--fever, pain, vomiting, blood in stool, etc.  Gastroesophageal Reflux Disease, Adult Gastroesophageal reflux disease (GERD) happens when acid from your stomach flows up into the esophagus. When acid comes in contact with the esophagus, the acid causes soreness (inflammation) in the esophagus. Over time, GERD may create small holes (ulcers) in the lining of the esophagus. CAUSES   Increased body weight. This puts pressure on the stomach, making acid rise from the stomach into the esophagus.  Smoking. This increases acid production in the stomach.  Drinking alcohol. This causes decreased pressure in the lower esophageal sphincter (valve or ring of muscle between the esophagus and stomach), allowing acid from the stomach into the esophagus.  Late evening meals and a full stomach. This increases pressure and acid production in the stomach.  A malformed lower esophageal sphincter. Sometimes, no cause is found. SYMPTOMS   Burning pain in the lower part of the mid-chest behind the breastbone and in the mid-stomach area. This may occur twice a week or more often.  Trouble swallowing.  Sore throat.  Dry cough.  Asthma-like  symptoms including chest tightness, shortness of breath, or wheezing. DIAGNOSIS  Your caregiver may be able to diagnose GERD based on your symptoms. In some cases, X-rays and other tests may be done to check for complications or to check the condition of your stomach and esophagus. TREATMENT  Your caregiver may recommend over-the-counter or prescription medicines to help decrease acid production. Ask your caregiver before starting or adding any new medicines.  HOME CARE INSTRUCTIONS   Change the factors that you can control. Ask your caregiver for guidance concerning weight loss, quitting smoking, and alcohol consumption.  Avoid foods and drinks that make your symptoms worse, such as:  Caffeine or alcoholic drinks.  Chocolate.  Peppermint or mint flavorings.  Garlic and onions.  Spicy foods.  Citrus fruits, such as oranges, lemons, or limes.  Tomato-based foods such as sauce, chili, salsa, and pizza.  Fried and fatty foods.  Avoid lying down for the 3 hours prior to your bedtime or prior to taking a nap.  Eat small, frequent meals instead of large meals.  Wear loose-fitting clothing. Do not wear anything tight around your waist that causes pressure on your stomach.  Raise the head of your bed 6 to 8 inches with wood blocks to help you sleep. Extra pillows will not help.  Only take over-the-counter or prescription medicines for pain, discomfort, or fever as directed by your caregiver.  Do not take aspirin, ibuprofen, or other nonsteroidal anti-inflammatory drugs (NSAIDs). SEEK IMMEDIATE MEDICAL CARE IF:   You have pain in your arms, neck, jaw, teeth, or back.  Your pain increases or changes in intensity or  duration.  You develop nausea, vomiting, or sweating (diaphoresis).  You develop shortness of breath, or you faint.  Your vomit is green, yellow, black, or looks like coffee grounds or blood.  Your stool is red, bloody, or black. These symptoms could be signs of  other problems, such as heart disease, gastric bleeding, or esophageal bleeding. MAKE SURE YOU:   Understand these instructions.  Will watch your condition.  Will get help right away if you are not doing well or get worse. Document Released: 02/13/2005 Document Revised: 07/29/2011 Document Reviewed: 11/23/2010 [facility] Patient Information 2014 [place], [place].

## 2013-08-18 NOTE — Progress Notes (Signed)
Chief Complaint  Patient presents with  . Diarrhea    1-2 weeks and states that his stomach hurts. Throat has been hurting for a few days as well.    He presents with complaint of epigastric pain for the last 1-2 weeks. Pain is described as a pounding, throbbing pain.  He has also had some sore throat, but that has improved some.  Denies heartburn, belching.  He had a cold last week, took Tylenol Cold, and cold symptoms improved.  Having ongoing stomach pain.  Hasn't taken any NSAID's.  Earlier today he felt like he was going to throw up.  He had [employer] fried chicken last night, and macaroni and cheese; he only ate once at night, didn't eat anything else the rest of the day.  He ate late at night (around 10pm).  Went to bed around 2am. His abdominal pain today was there when he woke up today.  He does think that sometimes the pain is worse after eating pizza.  He tends to just eat one meal per day, often late.  Denies vomiting, no diarrhea.  He denies any change in activity or muscle strain.  He moved a month ago, did heavy lifting, but pain didn't start until 1-2 weeks ago.  He denies any seasonal allergies (perhaps when a younger child, not recently).  Denies any sick contacts:  Past Medical History  Diagnosis Date  . Chronic headaches   . Learning disability   . ADD (attention deficit disorder)   . Depression    History reviewed. No pertinent past surgical history. History   Social History  . Marital Status: Single    Spouse Name: N/A    Number of Children: N/A  . Years of Education: N/A   Occupational History  . Not on file.   Social History Main Topics  . Smoking status: Never Smoker   . Smokeless tobacco: Never Used  . Alcohol Use: No  . Drug Use: No  . Sexual Activity: Yes    Partners: Female   Other Topics Concern  . Not on file   Social History Narrative  . No narrative on file   Meds:  Adderall once daily  No Known Allergies  ROS:  No fevers, chills.   Mild occasional headache.  No dizziness.  +nausea today only.  No vomiting, diarrhea.  No urinary complaints.  Bowels are normal, unchanged. Denies myalgias, arthralgias, bleeding, bruising, rashes.  PHYSICAL EXAM: BP 110/74  Pulse 76  Temp(Src) 97.8 F (36.6 C) (Oral)  Ht 6\' 1"  (1.854 m)  Wt 264 lb (119.75 kg)  BMI 34.84 kg/m2 Well developed, obese, pleasant male in no distress HEENT:  PERRL, EOMI, conjunctiva clear. OP clear without erythema, exudate.  Mucus membranes moist Heart: regular rate and rhythm without murmur Lungs: clear bilaterally Abdomen: +Epigastric tenderness.  Normal bowel sounds. No organomegaly or mass, negative Murphy sign. Skin: some flaking on face, slightly hypopigmented.  No other rashes noted Psych: normal mood, affect Neuro: alert and oriented  ASSESSMENT/PLAN:  Abdominal pain, epigastric - Plan: esomeprazole (NEXIUM) 40 MG capsule  Given poor diet, likely related to reflux vs HH.  Doesn't sound like viral gastritis. Recent URI, resolved.  Reviewed reflux precautions, avoid NSAIDs. Nexium 40mg  x 10 days of samples, f/b one box of OTC Nexium 24hr sample to use if needed upon persistent/recurrent symptoms after completing 10 day rx strength.    F/u 2-3 weeks if symptoms don't completely resolve, sooner if worse.

## 2013-10-26 ENCOUNTER — Telehealth: Payer: Self-pay | Admitting: Family Medicine

## 2013-10-26 MED ORDER — AMPHETAMINE-DEXTROAMPHETAMINE 20 MG PO TABS: 20.0000 mg | ORAL_TABLET | Freq: Two times a day (BID) | ORAL | Status: DC

## 2013-10-26 NOTE — Telephone Encounter (Signed)
Left message pt rx is ready

## 2013-10-26 NOTE — Telephone Encounter (Signed)
Pt called requesting refills on adderall 20mg . He takes it twice a day. Call [phone] when ready.

## 2013-11-16 ENCOUNTER — Encounter: Payer: Self-pay | Admitting: Medical

## 2013-11-16 ENCOUNTER — Ambulatory Visit (INDEPENDENT_AMBULATORY_CARE_PROVIDER_SITE_OTHER): Payer: BC Managed Care – PPO | Admitting: Medical

## 2013-11-16 VITALS — BP 110/70 | HR 80 | Temp 98.1°F | Resp 16 | Wt 269.0 lb

## 2013-11-16 DIAGNOSIS — R21 Rash and other nonspecific skin eruption: Secondary | ICD-10-CM | POA: Diagnosis not present

## 2013-11-16 DIAGNOSIS — B350 Tinea barbae and tinea capitis: Secondary | ICD-10-CM

## 2013-11-16 DIAGNOSIS — L738 Other specified follicular disorders: Secondary | ICD-10-CM

## 2013-11-16 DIAGNOSIS — L659 Nonscarring hair loss, unspecified: Secondary | ICD-10-CM | POA: Diagnosis not present

## 2013-11-16 MED ORDER — MINOXIDIL 2 % EX SOLN: Freq: Two times a day (BID) | CUTANEOUS | Status: DC

## 2013-11-16 MED ORDER — CEPHALEXIN 500 MG PO CAPS: 500.0000 mg | ORAL_CAPSULE | Freq: Three times a day (TID) | ORAL | Status: DC

## 2013-11-16 NOTE — Progress Notes (Signed)
   Subjective:   HPI  Patient is a 28 y.o. male presenting for 2 week history of dry, itchy skin on his face. When he washes his face in the morning and at night, his skin gets red and has a burning sensation over his cheeks.  He has tried using anti-fungal and psoriasis control creams with some improvement. He also admits that his hair is thinning out. Denies facial pain, drainage, bleeding. Denies a history of acne, eczema, seasonal allergies.No other aggravating or relieving factors.  Review of Systems     Objective:   Physical Exam  Gen: wd, wn, nad Skin: several patches of hypopigmented round lesions on face/cheeks, some pustules present on neck bilat.   Hair with some mild generalized thinning.      Assessment & Plan:   Encounter Diagnoses  Name Primary?  . Facial rash Yes  . Folliculitis barbae   . Hair thinning    Discussed his concerns and exam findings.  KOH prep shows some epithelials, but no fungal elements.  Specific recommendations today include:  Use OTC antifungal cream such as Lamisil topically to the face for 1-2 weeks  Stop the psoriasis cream for now  Begin Keflex oral antibiotic 1 tablet three times daily  Begin Rogaine topical solution to scalp twice daily  If no improvement at all in the facial rash by 2 weeks, then call back and I'll refer you to dermatology  Just know that if the facial rash is fugal, this will take several weeks to resolve  Similarly, it takes weeks to see improvement with Rogaine.  Use Selsun Blue shampoo topically to face and leave on 10 minutes and rinse off, do this treatment 3 times weekly  Follow up 3-4 wk.

## 2013-11-16 NOTE — Patient Instructions (Signed)
  Thank you for giving me the opportunity to serve you today.    Your diagnosis today includes: Encounter Diagnoses  Name Primary?  . Facial rash Yes  . Folliculitis barbae   . Hair thinning      Specific recommendations today include:  Use OTC antifungal cream such as Lamisil topically to the face for 1-2 weeks  Stop the psoriasis cream for now  Begin Keflex oral antibiotic 1 tablet three times daily  Begin Rogaine topical solution to scalp twice daily  If no improvement at all in the facial rash by 2 weeks, then call back and I'll refer you to dermatology  Just know that if the facial rash is fugal, this will take several weeks to resolve  Similarly, it takes weeks to see improvement with Rogaine.  Use Selsun Blue shampoo topically to face and leave on 10 minutes and rinse off, do this treatment 3 times weekly  Return 3-4wk.

## 2013-12-06 ENCOUNTER — Encounter: Payer: Self-pay | Admitting: Medical

## 2013-12-06 ENCOUNTER — Ambulatory Visit (INDEPENDENT_AMBULATORY_CARE_PROVIDER_SITE_OTHER): Payer: BC Managed Care – PPO | Admitting: Medical

## 2013-12-06 VITALS — BP 122/82 | HR 60 | Temp 97.7°F | Resp 16 | Wt 276.0 lb

## 2013-12-06 DIAGNOSIS — L659 Nonscarring hair loss, unspecified: Secondary | ICD-10-CM

## 2013-12-06 DIAGNOSIS — R21 Rash and other nonspecific skin eruption: Secondary | ICD-10-CM | POA: Diagnosis not present

## 2013-12-06 DIAGNOSIS — B350 Tinea barbae and tinea capitis: Secondary | ICD-10-CM

## 2013-12-06 DIAGNOSIS — L738 Other specified follicular disorders: Secondary | ICD-10-CM

## 2013-12-06 MED ORDER — TERBINAFINE HCL 1 % EX CREA: 1.0000 "application " | TOPICAL_CREAM | Freq: Two times a day (BID) | CUTANEOUS | Status: DC

## 2013-12-06 MED ORDER — MINOXIDIL 2 % EX SOLN: Freq: Two times a day (BID) | CUTANEOUS | Status: DC

## 2013-12-06 NOTE — Patient Instructions (Signed)
Hair loss  Begin Rogaine topical solution to scalp twice daily   Facial rash/cheek rash:  Use antifungal cream Lamisil topically to the face for several weeks.  It make take 3+ weeks for this to improve.  Continue the medication another week after the rash resolves Stop the psoriasis cream for now  Use Selsun Blue shampoo topically to face and leave on 10 minutes and rinse off, do this treatment 3 times weekly  Return 3-4wk.

## 2013-12-06 NOTE — Progress Notes (Signed)
   Subjective:   HPI  Patient is a 28 y.o. male presenting for recheck.  I saw him at the end of June for multiple skin and hair concerns.  He notes history of dry, itchy skin on his face. When he washes his face in the morning and at night, his skin gets red and has a burning sensation over his cheeks.  He has tried using anti-fungal and psoriasis control creams with some improvement. He also admits that his hair is thinning out. Denies facial pain, drainage, bleeding. Denies a history of acne, eczema, seasonal allergies.  Since last visit he was unable to get the Rogaine or Lamisil but he did complete a course of Keflex.  Feels like he is already seeing an improvement in the facial rashes and bumps  No other aggravating or relieving factors.  Review of Systems     Objective:   Physical Exam  Gen: wd, wn, nad Skin: several patches of hypopigmented round lesions on face/cheeks, no current pustules present on neck bilat.   Hair with some mild generalized thinning.      Assessment & Plan:   Encounter Diagnoses  Name Primary?  . Facial rash Yes  . Hair thinning   . Folliculitis barbae    Hair loss  Begin Rogaine topical solution to scalp twice daily   Facial rash/cheek rash:  Use antifungal cream Lamisil topically to the face for several weeks.  It make take 3+ weeks for this to improve.  Continue the medication another week after the rash resolves Stop the psoriasis cream for now  Use Selsun Blue shampoo topically to face and leave on 10 minutes and rinse off, do this treatment 3 times weekly  Folliculitis-much improved, continue good hygiene  Return 3-4wk.

## 2014-01-04 ENCOUNTER — Encounter: Payer: Self-pay | Admitting: Medical

## 2014-01-04 ENCOUNTER — Ambulatory Visit (INDEPENDENT_AMBULATORY_CARE_PROVIDER_SITE_OTHER): Payer: BC Managed Care – PPO | Admitting: Medical

## 2014-01-04 VITALS — BP 118/78 | HR 60 | Temp 98.0°F | Resp 16 | Wt 274.0 lb

## 2014-01-04 DIAGNOSIS — B354 Tinea corporis: Secondary | ICD-10-CM | POA: Diagnosis not present

## 2014-01-04 DIAGNOSIS — L659 Nonscarring hair loss, unspecified: Secondary | ICD-10-CM | POA: Diagnosis not present

## 2014-01-04 DIAGNOSIS — R21 Rash and other nonspecific skin eruption: Secondary | ICD-10-CM | POA: Diagnosis not present

## 2014-01-04 MED ORDER — MINOXIDIL 2 % EX SOLN: Freq: Two times a day (BID) | CUTANEOUS | Status: DC

## 2014-01-04 MED ORDER — TERBINAFINE HCL 1 % EX CREA: 1.0000 "application " | TOPICAL_CREAM | Freq: Two times a day (BID) | CUTANEOUS | Status: DC

## 2014-01-04 NOTE — Progress Notes (Signed)
   Subjective:   HPI  Patient is a 28 y.o. male presenting for recheck. Here for multiple skin and hair concerns.  He was having dry, itchy skin on his face. At this point he is finished the Keflex, he continues the Lamisil, [employer], Rogaine, and all of his symptoms including hair loss and facial rash have improved.  No other aggravating or relieving factors.  Review of Systems     Objective:   Physical Exam  Gen: wd, wn, nad Skin: now only a few patches of hypopigmented round lesions on left upper cheek, no current pustules present on neck bilat.   Hair with some mild generalized thinning.      Assessment & Plan:   Encounter Diagnoses  Name Primary?  . Tinea corporis Yes  . Hair loss   . Facial rash    Symptoms and exam overall much improved.   Hair loss  continue Rogaine topical solution to scalp once daily for 4-6 months.  Facial rash/cheek rash:  Use antifungal cream Lamisil topically to the face for 2 more weeks, then consider stopping this unless symptoms worsen again. Use Selsun Blue shampoo topically to face and leave on 10 minutes and rinse off, do this treatment 3 times weekly for 2-3 months.   If much improved at that time, you can stop the [facility] or use it as needed for a few weeks at a time.

## 2014-01-04 NOTE — Patient Instructions (Signed)
Hair loss  continue Rogaine topical solution to scalp once daily for 4-6 months.  Facial rash/cheek rash:  Use antifungal cream Lamisil topically to the face for 2 more weeks, then consider stopping this unless symptoms worsen again. Use Selsun Blue shampoo topically to face and leave on 10 minutes and rinse off, do this treatment 3 times weekly for 2-3 months.   If much improved at that time, you can stop the [facility] or use it as needed for a few weeks at a time.

## 2014-01-06 ENCOUNTER — Ambulatory Visit: Payer: Medicaid Other | Admitting: Medical

## 2014-02-10 ENCOUNTER — Ambulatory Visit (INDEPENDENT_AMBULATORY_CARE_PROVIDER_SITE_OTHER): Payer: BC Managed Care – PPO | Admitting: Family Medicine

## 2014-02-10 ENCOUNTER — Encounter: Payer: Self-pay | Admitting: Family Medicine

## 2014-02-10 VITALS — BP 120/80 | HR 88 | Temp 97.8°F | Ht 73.0 in | Wt 277.0 lb

## 2014-02-10 DIAGNOSIS — R059 Cough, unspecified: Secondary | ICD-10-CM | POA: Diagnosis not present

## 2014-02-10 DIAGNOSIS — J029 Acute pharyngitis, unspecified: Secondary | ICD-10-CM

## 2014-02-10 DIAGNOSIS — J069 Acute upper respiratory infection, unspecified: Secondary | ICD-10-CM

## 2014-02-10 DIAGNOSIS — R05 Cough: Secondary | ICD-10-CM

## 2014-02-10 LAB — POCT RAPID STREP A (OFFICE): Rapid Strep A Screen: NEGATIVE

## 2014-02-10 MED ORDER — BENZONATATE 200 MG PO CAPS: 200.0000 mg | ORAL_CAPSULE | Freq: Three times a day (TID) | ORAL | Status: DC | PRN

## 2014-02-10 NOTE — Progress Notes (Signed)
Chief Complaint  Patient presents with  . Cough    and ST x 2 weeks. Took some Theraflu and did feel better but now is worse. Mucus is white. No fevers.    2 weeks ago he had sore throat, and was coughing up white-colored phlegm. He has had persistent symptoms. Cough is usually pretty dry. Throat feels tight and sore.  Theraflu temporarily helps.  He sometimes has some pressure in his sinuses/cheeks.  Denies any stuffy or runny nose.  He has some PND.  He coughs mostly in the morning, sometimes during the day, just a little at night.  Cough doesn't wake him at night.   Denies sick contacts (dad was, but he is better now).  Past Medical History  Diagnosis Date  . Chronic headaches   . Learning disability   . ADD (attention deficit disorder)   . Depression    History reviewed. No pertinent past surgical history.  History   Social History  . Marital Status: Single    Spouse Name: N/A    Number of Children: N/A  . Years of Education: N/A   Occupational History  . Not on file.   Social History Main Topics  . Smoking status: Never Smoker   . Smokeless tobacco: Never Used  . Alcohol Use: No  . Drug Use: No  . Sexual Activity: Yes    Partners: Female   Other Topics Concern  . Not on file   Social History Narrative  . No narrative on file     Outpatient Encounter Prescriptions as of 02/10/2014  Medication Sig Note  . amphetamine-dextroamphetamine (ADDERALL) 20 MG tablet Take 1 tablet (20 mg total) by mouth 2 (two) times daily.   . minoxidil (ROGAINE) 2 % external solution Apply topically 2 (two) times daily. Hair loss   . terbinafine (LAMISIL AT) 1 % cream Apply 1 application topically 2 (two) times daily. 02/10/2014: Not using   No Known Allergies  ROS:  Denies fevers, chills. Only slight, occasional headache.  Denies chest pain, shortness of breath.  Denies diarrhea, bowel changes.  Denies skin rashes, bleeding, bruising.  No GU complaints.  PHYSICAL EXAM: BP 120/80   Pulse 88  Temp(Src) 97.8 F (36.6 C) (Tympanic)  Ht _0  (1.854 m)  Wt 277 lb (125.646 kg)  BMI 36.55 kg/m2 Well developed, pleasant male, with intermittent dry coughing HEENT:  PERRL, EOMI, conjunctiva clear. TM's and EAC's normal.  Nasal mucosa is moderately edematous with clear mucus and yellow crust distally on the left.  OP is clear, without erythema or exudate.  Sinuses are nontender Neck: no lymphadenopathy, thyromegaly or mass Heart: regular rate and rhythm without murmur Lungs: clear bilaterally.  He had some coughing initially, but able to take good, deep breaths, and no wheezes upon forced expiration Skin: no rash Neuro: alert and oriented Psych: normal mood  Rapid strep negative.  ASSESSMENT/PLAN:  Sore throat - Plan: Rapid Strep A  Cough - Plan: benzonatate (TESSALON) 200 MG capsule  Acute upper respiratory infections of unspecified site  Cough is secondary to postnasal drainage.  ?if due from URI vs allergies.  Do not suspect sinus infection given that mucus is clear.  Drink plenty of fluids. Start Mucinex (plain or DM--the DM has dextromethorphan which is a cough suppressant), and take it as directed. Use Claritin (plain or with D--the D has decongestant in it which will further help dry up and drainage and help with sinus pain) daily. Consider doing sinus rinses (sinus rinse  kit or a neti-pot)  If your mucus becomes discolored, and/or you develop fever, call for antibiotic. You have been prescribed tessalon, which is a cough suppressant that will help with the dry cough (and shouldn't make you sleepy).

## 2014-02-10 NOTE — Patient Instructions (Signed)
  Cough is secondary to postnasal drainage.  ?if due from URI vs allergies.  Do not suspect sinus infection given that mucus is clear.  Drink plenty of fluids. Start Mucinex (plain or DM--the DM has dextromethorphan which is a cough suppressant), and take it as directed. Use Claritin (plain or with D--the D has decongestant in it which will further help dry up and drainage and help with sinus pain) daily. Consider doing sinus rinses (sinus rinse kit or a neti-pot)  If your mucus becomes discolored, and/or you develop fever, call for antibiotic. You have been prescribed tessalon, which is a cough suppressant that will help with the dry cough (and shouldn't make you sleepy).

## 2014-05-03 ENCOUNTER — Telehealth: Payer: Self-pay | Admitting: Internal Medicine

## 2014-05-03 NOTE — Telephone Encounter (Signed)
Faxed over medical records to [facility] @ [phone] on 05/02/14

## 2014-06-24 DIAGNOSIS — Z0279 Encounter for issue of other medical certificate: Secondary | ICD-10-CM

## 2014-08-11 ENCOUNTER — Encounter: Payer: Self-pay | Admitting: Medical

## 2014-08-11 ENCOUNTER — Telehealth: Payer: Self-pay | Admitting: Medical

## 2014-08-11 ENCOUNTER — Ambulatory Visit (INDEPENDENT_AMBULATORY_CARE_PROVIDER_SITE_OTHER): Payer: Medicaid Other | Admitting: Medical

## 2014-08-11 VITALS — BP 120/80 | HR 72 | Temp 98.0°F | Resp 16 | Ht 73.5 in | Wt 275.0 lb

## 2014-08-11 DIAGNOSIS — Z23 Encounter for immunization: Secondary | ICD-10-CM | POA: Diagnosis not present

## 2014-08-11 DIAGNOSIS — Z113 Encounter for screening for infections with a predominantly sexual mode of transmission: Secondary | ICD-10-CM | POA: Diagnosis not present

## 2014-08-11 DIAGNOSIS — F988 Other specified behavioral and emotional disorders with onset usually occurring in childhood and adolescence: Secondary | ICD-10-CM | POA: Insufficient documentation

## 2014-08-11 DIAGNOSIS — L659 Nonscarring hair loss, unspecified: Secondary | ICD-10-CM | POA: Diagnosis not present

## 2014-08-11 DIAGNOSIS — F909 Attention-deficit hyperactivity disorder, unspecified type: Secondary | ICD-10-CM

## 2014-08-11 DIAGNOSIS — F819 Developmental disorder of scholastic skills, unspecified: Secondary | ICD-10-CM | POA: Diagnosis not present

## 2014-08-11 DIAGNOSIS — E669 Obesity, unspecified: Secondary | ICD-10-CM | POA: Diagnosis not present

## 2014-08-11 DIAGNOSIS — Z0000 Encounter for general adult medical examination without abnormal findings: Secondary | ICD-10-CM | POA: Diagnosis not present

## 2014-08-11 LAB — HEMOGLOBIN A1C
Hgb A1c MFr Bld: 5.3 % (ref <5.7)
Mean Plasma Glucose: 105 mg/dL (ref <117)

## 2014-08-11 LAB — CBC
HCT: 47.2 % (ref 39.0–52.0)
Hemoglobin: 16.1 g/dL (ref 13.0–17.0)
MCH: 28.1 pg (ref 26.0–34.0)
MCHC: 34.1 g/dL (ref 30.0–36.0)
MCV: 82.5 fL (ref 78.0–100.0)
MPV: 10.1 fL (ref 8.6–12.4)
Platelets: 300 10*3/uL (ref 150–400)
RBC: 5.72 MIL/uL (ref 4.22–5.81)
RDW: 14 % (ref 11.5–15.5)
WBC: 5.5 10*3/uL (ref 4.0–10.5)

## 2014-08-11 LAB — POCT URINALYSIS DIPSTICK
Bilirubin, UA: NEGATIVE
Blood, UA: NEGATIVE
Glucose, UA: NEGATIVE
KETONES UA: NEGATIVE
LEUKOCYTES UA: NEGATIVE
Nitrite, UA: NEGATIVE
Spec Grav, UA: >=1.03
UROBILINOGEN UA: NEGATIVE
pH, UA: 6

## 2014-08-11 LAB — LIPID PANEL
Cholesterol: 186 mg/dL (ref 0–200)
HDL: 39 mg/dL — ABNORMAL LOW (ref >=40)
LDL CALC: 129 mg/dL — AB (ref 0–99)
TRIGLYCERIDES: 92 mg/dL (ref <150)
Total CHOL/HDL Ratio: 4.8 Ratio
VLDL: 18 mg/dL (ref 0–40)

## 2014-08-11 LAB — COMPREHENSIVE METABOLIC PANEL
ALBUMIN: 4.6 g/dL (ref 3.5–5.2)
ALT: 33 U/L (ref 0–53)
AST: 19 U/L (ref 0–37)
Alkaline Phosphatase: 36 U/L — ABNORMAL LOW (ref 39–117)
BUN: 14 mg/dL (ref 6–23)
CALCIUM: 9.8 mg/dL (ref 8.4–10.5)
CHLORIDE: 102 meq/L (ref 96–112)
CO2: 29 meq/L (ref 19–32)
Creat: 1.13 mg/dL (ref 0.50–1.35)
Glucose, Bld: 102 mg/dL — ABNORMAL HIGH (ref 70–99)
POTASSIUM: 4.8 meq/L (ref 3.5–5.3)
Sodium: 139 mEq/L (ref 135–145)
Total Bilirubin: 0.4 mg/dL (ref 0.2–1.2)
Total Protein: 7.3 g/dL (ref 6.0–8.3)

## 2014-08-11 MED ORDER — MINOXIDIL 2 % EX SOLN: Freq: Two times a day (BID) | CUTANEOUS | Status: DC

## 2014-08-11 MED ORDER — AMPHETAMINE-DEXTROAMPHETAMINE 20 MG PO TABS: 20.0000 mg | ORAL_TABLET | Freq: Two times a day (BID) | ORAL | Status: DC

## 2014-08-11 NOTE — Telephone Encounter (Signed)
Please call pharmacy to check on ADD medication refills.  Per chart record, the electronic record showed last refill 10/2013, but he says he has gotten paper scripts that got him throughout til now.   He hasn't been here specifically for ADD in over a year.   Please confirm with pharmacy that he hasn't been getting medication from other providers, and that last scripts were in fact from our office.

## 2014-08-11 NOTE — Progress Notes (Signed)
Subjective:   HPI  [name] is a 29 y.o. male who presents for a complete physical.  Medical care team/other doctors includes: See eye doctor about every 5 years name unknown   Preventative care: Last physical or labs: 6th grade at [employer] Sees dentist yearly: yes, has appointment with Dr Sabino Gasser Last tetanus vaccine, TD or Tdap: 1994, has not had flu vaccine this year   Concerns: ADD - last f/u here been a while with Dr. Zara Council he said he had plenty of refills.  Takes the medication BID, works fine, in school, doing well with this without c/o.  Reviewed their medical, surgical, family, social, medication, and allergy history and updated chart as appropriate.  Past Medical History  Diagnosis Date  . Chronic headaches   . Learning disability   . ADD (attention deficit disorder)   . Depression   . Obesity   . Wears glasses   . Tinea corporis     History reviewed. No pertinent past surgical history.  History   Social History  . Marital Status: Single    Spouse Name: N/A  . Number of Children: N/A  . Years of Education: N/A   Occupational History  . Not on file.   Social History Main Topics  . Smoking status: Never Smoker   . Smokeless tobacco: Never Used  . Alcohol Use: No  . Drug Use: No  . Sexual Activity:    Partners: Female   Other Topics Concern  . Not on file   Social History Narrative   Lives with father.  At [employer], [place] for [employer], does modeling and acting on the side.   Exercise- some weights, walking.  Diet - ok, avoids soda, tries to eat healthy    Family History  Problem Relation Age of Onset  . Hypertension Mother   . Deep vein thrombosis Mother   . Multiple sclerosis Father   . Diabetes Paternal Aunt   . Heart disease Paternal Uncle   . Cancer Paternal Grandfather     prostate  . Stroke Neg Hx      Current outpatient prescriptions:  .  amphetamine-dextroamphetamine (ADDERALL) 20 MG tablet, Take 1 tablet (20  mg total) by mouth 2 (two) times daily., Disp: 60 tablet, Rfl: 0 .  minoxidil (ROGAINE) 2 % external solution, Apply topically 2 (two) times daily. Hair loss, Disp: 60 mL, Rfl: 5  No Known Allergies  Review of Systems Constitutional: -fever, -chills, -sweats, -unexpected weight change, -decreased appetite,+fatigue Allergy: -sneezing, -itching, -congestion Dermatology: -changing moles, --rash, -lumps ENT: -runny nose, -ear pain, -sore throat, -hoarseness, -sinus pain, -teeth pain, - ringing in ears, -hearing loss, -nosebleeds Cardiology: -chest pain, -palpitations, -swelling, -difficulty breathing when lying flat, -waking up short of breath Respiratory: -cough, -shortness of breath, -difficulty breathing with exercise or exertion, -wheezing, -coughing up blood Gastroenterology: -abdominal pain, -nausea, -vomiting, -diarrhea, -constipation, -blood in stool, -changes in bowel movement, -difficulty swallowing or eating Hematology: -bleeding, -bruising  Musculoskeletal: -joint aches, -muscle aches, -joint swelling, -back pain, -neck pain, -cramping, -changes in gait Ophthalmology: denies vision changes, eye redness, itching, discharge Urology: -burning with urination, -difficulty urinating, -blood in urine, -urinary frequency, -urgency, -incontinence Neurology: -headache, -weakness, -tingling, -numbness, +memory loss, -falls, -dizziness Psychology: +depressed mood, -agitation, -sleep problems     Objective:   Physical Exam BP 120/80 mmHg  Pulse 72  Temp(Src) 98 F (36.7 C) (Oral)  Resp 16  Ht 6' 1.5" (1.867 m)  Wt 275 lb (124.739 kg)  BMI 35.79  kg/m2  General appearance: alert, no distress, WD/WN, obese AA male Skin: unremarkable, few scattered benign macules HEENT: normocephalic, conjunctiva/corneas normal, sclerae anicteric, PERRLA, EOMi, nares patent, no discharge or erythema, pharynx normal Oral cavity: MMM, tongue normal, teeth in good repair Neck: supple, no lymphadenopathy, no  thyromegaly, no masses, normal ROM, no bruits Chest: non tender, normal shape and expansion Heart: RRR, normal S1, S2, no murmurs Lungs: CTA bilaterally, no wheezes, rhonchi, or rales Abdomen: +bs, soft, non tender, non distended, no masses, no hepatomegaly, no splenomegaly, no bruits Back: non tender, normal ROM, no scoliosis Musculoskeletal: upper extremities non tender, no obvious deformity, normal ROM throughout, lower extremities non tender, no obvious deformity, normal ROM throughout Extremities: no edema, no cyanosis, no clubbing Pulses: 2+ symmetric, upper and lower extremities, normal cap refill Neurological: alert, oriented x 3, CN2-12 intact, strength normal upper extremities and lower extremities, sensation normal throughout, DTRs 2+ throughout, no cerebellar signs, gait normal Psychiatric: normal affect, behavior normal, pleasant  GU: normal male external genitalia, circumcised, nontender, no masses, no hernia, no lymphadenopathy Rectal: deferred due to age 89yo and no indication today   Assessment and Plan :    Encounter Diagnoses  Name Primary?  . Encounter for health maintenance examination in adult Yes  . ADD (attention deficit disorder)   . Learning disability   . Obesity   . Hair loss   . Need for Tdap vaccination   . Screen for STD (sexually transmitted disease)     Physical exam - discussed healthy lifestyle, diet, exercise, preventative care, vaccinations, and addressed their concerns.   See your dentist yearly for routine dental care including hygiene visits twice yearly. See your eye doctor yearly for routine vision care. Routine and STD labs today.  Vaccinations: Counseled on the Tdap (tetanus, diptheria, and acellular pertussis) vaccine.  Vaccine information sheet given. Tdap vaccine given after consent obtained.  Other concerns today: Hair loss - c/t Rogaine, does fine on this ADD, learning disability - refillled medication, but will check with  pharmacy on prior refills.  Dr. Redmond School was last one here to refill. obesity - lifestyle changes advised, weight loss advised  Follow up pending labs

## 2014-08-12 LAB — TSH: TSH: 0.94 u[IU]/mL (ref 0.350–4.500)

## 2014-08-12 LAB — RPR

## 2014-08-12 LAB — GC/CHLAMYDIA PROBE AMP
CT Probe RNA: NEGATIVE
GC Probe RNA: NEGATIVE

## 2014-08-12 LAB — HIV ANTIBODY (ROUTINE TESTING W REFLEX): HIV: NONREACTIVE

## 2014-08-16 NOTE — Telephone Encounter (Signed)
The pharmacists said he got his last Rx from Dr. Redmond School 10/2013 and then again 05/2014 from Dr. Redmond School but not from anyone else.

## 2014-08-17 NOTE — Telephone Encounter (Signed)
noted 

## 2014-10-07 ENCOUNTER — Telehealth: Payer: Self-pay | Admitting: Medical

## 2014-10-07 NOTE — Telephone Encounter (Signed)
Pt is needing referral for facial rash. Called [facility] at [phone]. They will fax me over a referral form to fill out and i will fax back and they will call pt

## 2014-10-07 NOTE — Telephone Encounter (Signed)
Pt needs referral to Dermatologist @ [facility] in [facility] fax # [phone]

## 2014-10-07 NOTE — Telephone Encounter (Signed)
Do they have a dermatologist?    If so, then refer to dermatology ONLY. Is this for the facial rash I've seen him for before?  If not, may need appt here first.  Find out.

## 2014-10-10 NOTE — Telephone Encounter (Signed)
Faxed over referral to [facility]

## 2014-10-21 ENCOUNTER — Telehealth: Payer: Self-pay | Admitting: Medical

## 2014-10-21 NOTE — Telephone Encounter (Signed)
Father called in stating that his son is having a lot of foot pain to the point where it is getting harder for him to even walk. The father would like a referral to:  [facility]  Fax# [email or web address] 16-May-1986  Contact father: Vashti Hey @ [phone]

## 2014-10-24 NOTE — Telephone Encounter (Signed)
Is this okay to do?

## 2014-10-24 NOTE — Telephone Encounter (Signed)
Patients father scheduled an appointment on 10/27/14 @ 130 pm

## 2014-10-24 NOTE — Telephone Encounter (Signed)
Make want to come in for eval.  Have I seen him for this?  Last visit was physical visit

## 2014-10-27 ENCOUNTER — Telehealth: Payer: Self-pay

## 2014-10-27 ENCOUNTER — Ambulatory Visit: Payer: Self-pay | Admitting: Medical

## 2014-10-27 NOTE — Telephone Encounter (Signed)
This patient no showed for their appointment today.Which of the following is necessary for this patient.   A) No follow-up necessary   B) Follow-up urgent. Locate Patient Immediately.   C) Follow-up necessary. Contact patient and Schedule visit in ____ Days.   D) Follow-up Advised. Contact patient and Schedule visit in ____ Days. 

## 2014-12-27 ENCOUNTER — Encounter: Payer: Self-pay | Admitting: Family Medicine

## 2014-12-27 ENCOUNTER — Ambulatory Visit (INDEPENDENT_AMBULATORY_CARE_PROVIDER_SITE_OTHER): Payer: Medicaid Other | Admitting: Family Medicine

## 2014-12-27 VITALS — BP 120/80 | HR 80 | Temp 100.0°F | Wt 270.0 lb

## 2014-12-27 DIAGNOSIS — M2142 Flat foot [pes planus] (acquired), left foot: Secondary | ICD-10-CM

## 2014-12-27 DIAGNOSIS — J029 Acute pharyngitis, unspecified: Secondary | ICD-10-CM | POA: Diagnosis not present

## 2014-12-27 DIAGNOSIS — M2141 Flat foot [pes planus] (acquired), right foot: Secondary | ICD-10-CM

## 2014-12-27 LAB — POCT RAPID STREP A (OFFICE): Rapid Strep A Screen: NEGATIVE

## 2014-12-27 NOTE — Progress Notes (Signed)
   Subjective:    Patient ID: [name], male    DOB: [birth year], 29 y.o.   MRN: [id number]  HPI He has a three-day history of started with feeling hot followed by headache, abdominal pain, sore throat. No chills, earache, cough or congestion. He has tried TheraFlu without much success. At the end of the encounter he then mentioned flat feet and pain with any kind of physical activity and would like to be referred to a podiatrist. He has not tried any orthotics on his own.   Review of Systems     Objective:   Physical Exam Alert and in no distress. Tympanic membranes and canals are normal. Pharyngeal area is normal. Neck is supple without adenopathy or thyromegaly. Cardiac exam shows a regular sinus rhythm without murmurs or gallops. Lungs are clear to auscultation. Exam of his feet does show evidence of bilateral pes planus Strep screen is negative      Assessment & Plan:  Sore throat - Plan: Rapid Strep A  Pes planus of both feet - Plan: Ambulatory referral to Podiatry  recommend supportive care for the sore throat. He use 2 Aleve twice per day to help with symptoms.

## 2014-12-27 NOTE — Patient Instructions (Signed)
You can use Tylenol or Aleve. The dosing for Aleve would be 2 pills twice a day

## 2015-01-03 ENCOUNTER — Encounter: Payer: Self-pay | Admitting: Podiatry

## 2015-01-03 ENCOUNTER — Ambulatory Visit (INDEPENDENT_AMBULATORY_CARE_PROVIDER_SITE_OTHER): Payer: Medicaid Other | Admitting: Podiatry

## 2015-01-03 ENCOUNTER — Ambulatory Visit (INDEPENDENT_AMBULATORY_CARE_PROVIDER_SITE_OTHER): Payer: Medicaid Other

## 2015-01-03 VITALS — BP 108/71 | HR 75 | Resp 16 | Ht 73.5 in | Wt 270.0 lb

## 2015-01-03 DIAGNOSIS — M2141 Flat foot [pes planus] (acquired), right foot: Secondary | ICD-10-CM

## 2015-01-03 DIAGNOSIS — M2142 Flat foot [pes planus] (acquired), left foot: Secondary | ICD-10-CM

## 2015-01-03 DIAGNOSIS — M79673 Pain in unspecified foot: Secondary | ICD-10-CM

## 2015-01-03 NOTE — Progress Notes (Signed)
   Subjective:    Patient ID: STEFAN MARKARIAN, male    DOB: 06/20/1985, 29 y.o.   MRN: [id number]  HPI he presents today concerned about pain in the bilateral foot. He states that he's been flat-footed for years and used to wear an ankle brace that it no longer helps. He states that he continues to have pain in the plantar medial longitudinal arch as well as the dorsal aspect of the foot. 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    Review of Systems  All other systems reviewed and are negative.      Objective:   Physical Exam: 29 year old black male well-nourished in apparent good health with vital signs are stable he is alert and oriented 3. Pulses are strongly palpable. Neurologic sensorium is intact. Deep tendon reflexes are intact bilateral muscle strength +5 over 5 dorsiflexion plantar flexors and inverters everters all intrinsic musculature is intact. Orthopedic evaluation demonstrates gastroc equinus bilaterally. Calcaneal valgus is minimal upon weight-bearing in neutral position. He gently demonstrates pronation of the medial longitudinal arch which appears to be flexible in nature radiographs 3 views taken in the office today AP medial oblique and lateral demonstrate pes planus without subtalar joint complications. No coalitions visualized. Cutaneous evaluation demonstrates supple well-hydrated cutis no erythema edema cellulitis drainage or odor.        Assessment & Plan:  Assessment 29 year old male with flexible pes planus bilateral.  Plan: Discussed etiology pathology conservative versus surgical therapies. We discussed surgery in great detail today and he will follow-up with [place] in late October or early November for surgical consideration and consult.  Dr. Roselind Messier

## 2015-01-04 ENCOUNTER — Encounter: Payer: Self-pay | Admitting: Family Medicine

## 2015-01-04 ENCOUNTER — Ambulatory Visit (INDEPENDENT_AMBULATORY_CARE_PROVIDER_SITE_OTHER): Payer: Medicaid Other | Admitting: Family Medicine

## 2015-01-04 VITALS — BP 132/76 | HR 72 | Temp 97.9°F | Wt 268.4 lb

## 2015-01-04 DIAGNOSIS — R1013 Epigastric pain: Secondary | ICD-10-CM | POA: Diagnosis not present

## 2015-01-04 LAB — POCT URINALYSIS DIPSTICK
Bilirubin, UA: NEGATIVE
Blood, UA: NEGATIVE
Glucose, UA: NEGATIVE
Leukocytes, UA: NEGATIVE
Nitrite, UA: NEGATIVE
PH UA: 6
Protein, UA: NEGATIVE
UROBILINOGEN UA: 0.2

## 2015-01-04 MED ORDER — DEXLANSOPRAZOLE 60 MG PO CPDR: 60.0000 mg | DELAYED_RELEASE_CAPSULE | Freq: Every day | ORAL | Status: DC

## 2015-01-04 NOTE — Progress Notes (Signed)
   Subjective:    Patient ID: [name], male    DOB: 05/05/1986, 29 y.o.   MRN: [id number]  HPI He is here for nausea, vomiting and abdominal pain that feels like intermittent "burning" in his upper abdomen since 12/27/14. He states he is unable to keep most solid food down and vomits immediately after eating. He states he can keep down liquids. Reports he only ate potato chips and drank apple juice today at 11am and that he did not vomit after eating this. He states he ate a cheeseburger last evening and vomited immediately after eating it. Reports feeing hungry at times. Denies blood in emesis. States he had a bowel movement earlier today that it was normal brown formed stool, no blood or pus. He denies fever, chills, fatigue, body aches. No recent travel or sick contacts. He denies use of NSAIDS or alcohol use. He is not a smoker.   Reviewed allergies, medications, past medical, and social history.    Review of Systems Pertinent positive and negative symptoms in HPI, systems otherwise negative.     Objective:   Physical Exam  Constitutional: He is oriented to person, place, and time. He appears well-developed and well-nourished. No distress.  HENT:  Right Ear: External ear normal.  Left Ear: External ear normal.  Nose: Nose normal.  Mouth/Throat: Oropharynx is clear and moist. No oropharyngeal exudate.  Eyes: Conjunctivae are normal. No scleral icterus.  Neck: Normal range of motion. Neck supple.  Cardiovascular: Normal rate and regular rhythm.   Pulmonary/Chest: Effort normal and breath sounds normal.  Abdominal: Soft. Bowel sounds are normal. He exhibits no distension. There is no hepatosplenomegaly. There is tenderness. There is no rigidity, no rebound, no guarding, no tenderness at McBurney's point and negative Murphy's sign.  Tender diffusely  Lymphadenopathy:    He has no cervical adenopathy.  Neurological: He is alert and oriented to person, place, and time.  Skin:  Skin is warm and dry. No rash noted.  Psychiatric: He has a normal mood and affect. His behavior is normal. Judgment and thought content normal.   BP 132/76 mmHg  Pulse 72  Temp(Src) 97.9 F (36.6 C) (Tympanic)  Wt 268 lb 6.4 oz (121.745 kg) Weight loss of less than 2 lbs since last visit.   Urinalysis positive for trace of ketones.     Assessment & Plan:  Epigastric pain - Plan: POCT Urinalysis Dipstick  Suspect his abdominal pain and vomiting is related to reflux. Advised him to eat a bland diet and educated him on choices of bland food such as rice, soups. Also encouraged him to avoid spicy, fried or fatty foods. Gave him sample of Dexilant to try for next 7-10 days and advised him to follow up at that time or sooner if his symptoms worsen. Encouraged him to avoid NSAIDS.

## 2015-01-04 NOTE — Patient Instructions (Signed)
Eat a bland diet, no aleve, motrin, ibuprofen. If you need pain medicine use tylenol. Avoid spicy and fried foods. Take the Dexilant daily and return in 7-10 days.

## 2015-01-13 ENCOUNTER — Ambulatory Visit (INDEPENDENT_AMBULATORY_CARE_PROVIDER_SITE_OTHER): Payer: Medicaid Other | Admitting: Family Medicine

## 2015-01-13 ENCOUNTER — Encounter: Payer: Self-pay | Admitting: Physician Assistant

## 2015-01-13 ENCOUNTER — Encounter: Payer: Self-pay | Admitting: Family Medicine

## 2015-01-13 VITALS — BP 126/82 | HR 64 | Wt 267.4 lb

## 2015-01-13 DIAGNOSIS — K219 Gastro-esophageal reflux disease without esophagitis: Secondary | ICD-10-CM | POA: Diagnosis not present

## 2015-01-13 DIAGNOSIS — R1033 Periumbilical pain: Secondary | ICD-10-CM

## 2015-01-13 LAB — CBC WITH DIFFERENTIAL/PLATELET
Basophils Absolute: 0 10*3/uL (ref 0.0–0.1)
Basophils Relative: 1 % (ref 0–1)
Eosinophils Absolute: 0 10*3/uL (ref 0.0–0.7)
Eosinophils Relative: 1 % (ref 0–5)
HEMATOCRIT: 46 % (ref 39.0–52.0)
HEMOGLOBIN: 15.8 g/dL (ref 13.0–17.0)
LYMPHS ABS: 2.7 10*3/uL (ref 0.7–4.0)
LYMPHS PCT: 62 % — AB (ref 12–46)
MCH: 28.2 pg (ref 26.0–34.0)
MCHC: 34.3 g/dL (ref 30.0–36.0)
MCV: 82 fL (ref 78.0–100.0)
MONO ABS: 0.4 10*3/uL (ref 0.1–1.0)
MPV: 10.2 fL (ref 8.6–12.4)
Monocytes Relative: 8 % (ref 3–12)
NEUTROS ABS: 1.2 10*3/uL — AB (ref 1.7–7.7)
Neutrophils Relative %: 28 % — ABNORMAL LOW (ref 43–77)
Platelets: 270 10*3/uL (ref 150–400)
RBC: 5.61 MIL/uL (ref 4.22–5.81)
RDW: 14.4 % (ref 11.5–15.5)
WBC: 4.4 10*3/uL (ref 4.0–10.5)

## 2015-01-13 MED ORDER — ESOMEPRAZOLE MAGNESIUM 20 MG PO CPDR: 20.0000 mg | DELAYED_RELEASE_CAPSULE | Freq: Every day | ORAL | Status: DC

## 2015-01-13 NOTE — Patient Instructions (Addendum)
Start taking the Nexium I prescribed. Take it 30 minutes before breakfast. Continue eating small frequent meals as opposed to large heavy meals. Also continue to avoid foods that upset your stomach. We will contact you later today regarding your lab results. Let us know if you develop a high fever, uncontrolled vomiting or worsening abdominal pain.   Food Choices for Gastroesophageal Reflux Disease When you have gastroesophageal reflux disease (GERD), the foods you eat and your eating habits are very important. Choosing the right foods can help ease the discomfort of GERD. WHAT GENERAL GUIDELINES DO I NEED TO FOLLOW?  Choose fruits, vegetables, whole grains, low-fat dairy products, and low-fat meat, fish, and poultry.  Limit fats such as oils, salad dressings, butter, nuts, and avocado.  Keep a food diary to identify foods that cause symptoms.  Avoid foods that cause reflux. These may be different for different people.  Eat frequent small meals instead of three large meals each day.  Eat your meals slowly, in a relaxed setting.  Limit fried foods.  Cook foods using methods other than frying.  Avoid drinking alcohol.  Avoid drinking large amounts of liquids with your meals.  Avoid bending over or lying down until 2-3 hours after eating. WHAT FOODS ARE NOT RECOMMENDED? The following are some foods and drinks that may worsen your symptoms: Vegetables Tomatoes. Tomato juice. Tomato and spaghetti sauce. Chili peppers. Onion and garlic. Horseradish. Fruits Oranges, grapefruit, and lemon (fruit and juice). Meats High-fat meats, fish, and poultry. This includes hot dogs, ribs, ham, sausage, salami, and bacon. Dairy Whole milk and chocolate milk. Sour cream. Cream. Butter. Ice cream. Cream cheese.  Beverages Coffee and tea, with or without caffeine. Carbonated beverages or energy drinks. Condiments Hot sauce. Barbecue sauce.  Sweets/Desserts Chocolate and cocoa. Donuts. Peppermint  and spearmint. Fats and Oils High-fat foods, including [place] fries and potato chips. Other Vinegar. Strong spices, such as black pepper, white pepper, red pepper, cayenne, curry powder, cloves, ginger, and chili powder. The items listed above may not be a complete list of foods and beverages to avoid. Contact your dietitian for more information. Document Released: 05/06/2005 Document Revised: 05/11/2013 Document Reviewed: 03/10/2013 [facility] Patient Information 2015 [place], [place]. This information is not intended to replace advice given to you by your health care provider. Make sure you discuss any questions you have with your health care provider.

## 2015-01-13 NOTE — Progress Notes (Signed)
   Subjective:    Patient ID: [name], male    DOB: Apr 18, 1986, 29 y.o.   MRN: [id number]  HPI He is here for a 10 day follow up for epigastric pain and vomiting. He has been taking daily Dexilant and reports eating a bland diet and smaller meals than before. He states he is not vomiting after every meal as he was before, "only occasional" vomiting now and the burning in his abdomen seems to be somewhat better. However, he does complain of persistent periumbilical pain that he describes as a dull ache. He states the pain is non radiating and constant and nothing makes it worse or better. He denies fever, chills, nausea, vomiting, diarrhea, constipation. States his last bowel movement was yesterday and was brown formed stool without blood or pus. He states he is passing gas today. He states he has not taken NSAIDS and does not drink alcohol or smoke.    Review of Systems Pertinent positives and negatives in the history of present illness.    Objective:   Physical Exam  Constitutional: He appears well-developed and well-nourished. No distress.  Neck: Normal range of motion. Neck supple.  Pulmonary/Chest: Effort normal and breath sounds normal.  Abdominal: Soft. Normal appearance and bowel sounds are normal. He exhibits no distension and no ascites. There is tenderness in the periumbilical area. There is no rigidity, no rebound, no guarding, no CVA tenderness, no tenderness at McBurney's point and negative Murphy's sign.    Negative psoas, obturator   Filed Vitals:   01/13/15 1109  BP: 126/82  Pulse: 64    Wt Readings from Last 3 Encounters:  01/13/15 267 lb 6.4 oz (121.292 kg)  01/04/15 268 lb 6.4 oz (121.745 kg)  01/03/15 270 lb (122.471 kg)         Assessment & Plan:  Gastroesophageal reflux disease, esophagitis presence not specified - Plan: Ambulatory referral to Gastroenterology, esomeprazole (NEXIUM) 20 MG capsule  Periumbilical pain - Plan: CBC with  Differential/Platelet, Ambulatory referral to Gastroenterology  His pain could be early appendicitis but highly unlikely. He seems to be somewhat better than last time I saw him however he continues to have pain with occasional reflux and vomiting. Will get a CBC to rule out infection. Discussed with him the importance of continuing to eat small frequent meals, not eating at least 2 hours before bedtime and avoid foods that make his reflux worse. Will keep him on a PPI for now, discussed that he should take this at least 30 minutes before breakfast. Will give current therapy one more week and if he is not any better will consider referral to gastro for further evaluation and treatment.

## 2015-02-02 ENCOUNTER — Encounter: Payer: Self-pay | Admitting: Physician Assistant

## 2015-02-02 ENCOUNTER — Ambulatory Visit (INDEPENDENT_AMBULATORY_CARE_PROVIDER_SITE_OTHER): Payer: Medicaid Other | Admitting: Physician Assistant

## 2015-02-02 VITALS — BP 104/72 | HR 68 | Ht 73.0 in | Wt 265.4 lb

## 2015-02-02 DIAGNOSIS — K219 Gastro-esophageal reflux disease without esophagitis: Secondary | ICD-10-CM

## 2015-02-02 DIAGNOSIS — R1310 Dysphagia, unspecified: Secondary | ICD-10-CM | POA: Diagnosis not present

## 2015-02-02 MED ORDER — PANTOPRAZOLE SODIUM 40 MG PO TBEC: 40.0000 mg | DELAYED_RELEASE_TABLET | Freq: Every day | ORAL | Status: DC

## 2015-02-02 NOTE — Progress Notes (Signed)
Patient ID: [name], male   DOB: 08/31/1985, 30 y.o.   MRN: [id number]    HPI:  [name] is a 29 y.o.   male  referred by Girtha Rm, NP for evaluation of GERD. Prandin states he has had terrible heartburn for several months. At the onset, every time he would eat he would vomit. He reports that he eats, gets a burning epigastric pain and feels as if his food just sits in his stomach. He often vomits after meals. He was evaluated by his primary care provider over the last few months and was given a trial of Nexium, which he says helps decrease the nausea. He reports he has had difficulty swallowing meats and dry food. He has had to spit food up multiple times because it won't go down. He denies dysphagia to liquids. He discontinued his Nexium 3 weeks ago because he ran out, and he notes that his epigastric pain became worse and his dysphagia got worse upon discontinuing the Nexium. He also reports that he has terrible seasonal allergies and notes that his dysphagia has been worse since his allergies are flaring. He denies use of non-steroidal anti-inflammatory drugs. He denies use of alcohol or tobacco. There is no family history of ulcers or GERD that he is aware of. He denies a family history of colon cancer, colon polyps, or inflammatory bowel disease.   Past Medical History  Diagnosis Date  . Chronic headaches   . Learning disability   . ADD (attention deficit disorder)   . Depression   . Obesity   . Wears glasses   . Tinea corporis   . Anxiety     History reviewed. No pertinent past surgical history. Family History  Problem Relation Age of Onset  . Hypertension Mother   . Deep vein thrombosis Mother   . Multiple sclerosis Father   . Diabetes Paternal Aunt   . Heart disease Paternal Uncle   . Prostate cancer Paternal Grandfather   . Stroke Neg Hx    Social History  Substance Use Topics  . Smoking status: Never Smoker   . Smokeless tobacco: Never Used  .  Alcohol Use: No   Current Outpatient Prescriptions  Medication Sig Dispense Refill  . amphetamine-dextroamphetamine (ADDERALL) 20 MG tablet Take 1 tablet (20 mg total) by mouth 2 (two) times daily. 60 tablet 0  . pantoprazole (PROTONIX) 40 MG tablet Take 1 tablet (40 mg total) by mouth daily. 30 tablet 6   No current facility-administered medications for this visit.   No Known Allergies   Review of Systems: Gen: Denies any fever, chills, sweats, anorexia, fatigue, weakness, malaise, weight loss, and sleep disorder CV: Denies chest pain, angina, palpitations, syncope, orthopnea, PND, peripheral edema, and claudication. Resp: Denies dyspnea at rest, dyspnea with exercise, cough, sputum, wheezing, coughing up blood, and pleurisy. GI: Denies vomiting blood, jaundice, and fecal incontinence.   Has dysphagia to solids. GU : Denies urinary burning, blood in urine, urinary frequency, urinary hesitancy, nocturnal urination, and urinary incontinence. MS: Denies joint pain, limitation of movement, and swelling, stiffness, low back pain, extremity pain. Denies muscle weakness, cramps, atrophy.  Derm: Denies rash, itching, dry skin, hives, moles, warts, or unhealing ulcers.  Psych: Denies depression, anxiety, memory loss, suicidal ideation, hallucinations, paranoia, and confusion. Heme: Denies bruising, bleeding, and enlarged lymph nodes. Neuro:  Denies any headaches, dizziness, paresthesias. Endo:  Denies any problems with DM, thyroid, adrenal function      Physical Exam: BP 104/72  mmHg  Pulse 68  Ht 6\' 1"  (1.854 m)  Wt 265 lb 6 oz (120.373 kg)  BMI 35.02 kg/m2 Constitutional: Pleasant,well-developed,male in no acute distress. HEENT: Normocephalic and atraumatic. Conjunctivae are normal. No scleral icterus. Neck supple. No JVD Cardiovascular: Normal rate, regular rhythm.  Pulmonary/chest: Effort normal and breath sounds normal. No wheezing, rales or rhonchi. Abdominal: Soft, nondistended,  nontender. Bowel sounds active throughout. There are no masses palpable. No hepatomegaly. Extremities: no edema Lymphadenopathy: No cervical adenopathy noted. Neurological: Alert and oriented to person place and time. Skin: Skin is warm and dry. No rashes noted. Psychiatric: Normal mood and affect. Behavior is normal.  ASSESSMENT AND PLAN: 30 year old male with a several month history of GERD and dysphagia referred for evaluation. An antireflux regimen has been reviewed at length. He will be given a trial of pantoprazole 40 mg 1 by mouth every morning 30 minutes prior to breakfast. He will be scheduled for a barium swallow with tablet to evaluate for possible stricture, wall thickening, etc. He will then be scheduled for an EGD to evaluate for esophagitis, gastritis, ulcer etc. as well as to perform dilation if necessary.The risks, benefits, and alternatives to endoscopy with possible biopsy and possible dilation were discussed with the patient and they consent to proceed.  The procedure will be scheduled with Dr. [name]. Further recommendations will be made pending the findings of the above.    [name], Jason Barley PA-C 02/02/2015, 11:35 AM  CC: Girtha Rm, NP

## 2015-02-02 NOTE — Patient Instructions (Signed)
We have sent the following medications to your pharmacy for you to pick up at your convenience:  Pantoprazole  You have been scheduled for a Barium Esophogram at [facility] (1st floor of the hospital) on 02/07/2015 at 11:00am. Please arrive 15 minutes prior to your appointment for registration. Make certain not to have anything to eat or drink 3 hours prior to your test. If you need to reschedule for any reason, please contact radiology at [phone] to do so. __________________________________________________________________ A barium swallow is an examination that concentrates on views of the esophagus. This tends to be a double contrast exam (barium and two liquids which, when combined, create a gas to distend the wall of the oesophagus) or single contrast (non-ionic iodine based). The study is usually tailored to your symptoms so a good history is essential. Attention is paid during the study to the form, structure and configuration of the esophagus, looking for functional disorders (such as aspiration, dysphagia, achalasia, motility and reflux) EXAMINATION You may be asked to change into a gown, depending on the type of swallow being performed. A radiologist and radiographer will perform the procedure. The radiologist will advise you of the type of contrast selected for your procedure and direct you during the exam. You will be asked to stand, sit or lie in several different positions and to hold a small amount of fluid in your mouth before being asked to swallow while the imaging is performed .In some instances you may be asked to swallow barium coated marshmallows to assess the motility of a solid food bolus. The exam can be recorded as a digital or video fluoroscopy procedure. POST PROCEDURE It will take 1-2 days for the barium to pass through your system. To facilitate this, it is important, unless otherwise directed, to increase your fluids for the next 24-48hrs and to resume your  normal diet.  This test typically takes about 30 minutes to perform. __________________________________________________________________________________     [name] have been scheduled for an endoscopy. Please follow written instructions given to you at your visit today. If you use inhalers (even only as needed), please bring them with you on the day of your procedure. Your physician has requested that you go to www.startemmi.com and enter the access code given to you at your visit today. This web site gives a general overview about your procedure. However, you should still follow specific instructions given to you by our office regarding your preparation for the procedure.

## 2015-02-02 NOTE — Progress Notes (Signed)
Reviewed and agree with management plan.  Levina Boyack T. Nakiyah Beverley, MD FACG 

## 2015-02-07 ENCOUNTER — Ambulatory Visit (HOSPITAL_COMMUNITY)
Admission: RE | Admit: 2015-02-07 | Discharge: 2015-02-07 | Disposition: A | Discharge status: Home / Self Care | Payer: Medicaid Other | Source: Ambulatory Visit | Attending: Physician Assistant | Admitting: Physician Assistant

## 2015-02-07 DIAGNOSIS — K219 Gastro-esophageal reflux disease without esophagitis: Secondary | ICD-10-CM | POA: Insufficient documentation

## 2015-02-07 DIAGNOSIS — R1310 Dysphagia, unspecified: Secondary | ICD-10-CM

## 2015-03-21 ENCOUNTER — Encounter: Payer: Self-pay | Admitting: Gastroenterology

## 2015-03-21 ENCOUNTER — Ambulatory Visit (AMBULATORY_SURGERY_CENTER): Payer: Medicaid Other | Admitting: Gastroenterology

## 2015-03-21 VITALS — BP 127/83 | HR 60 | Temp 97.1°F | Resp 16 | Ht 73.0 in | Wt 265.0 lb

## 2015-03-21 DIAGNOSIS — K219 Gastro-esophageal reflux disease without esophagitis: Secondary | ICD-10-CM | POA: Diagnosis not present

## 2015-03-21 DIAGNOSIS — K2970 Gastritis, unspecified, without bleeding: Secondary | ICD-10-CM | POA: Diagnosis not present

## 2015-03-21 DIAGNOSIS — K3189 Other diseases of stomach and duodenum: Secondary | ICD-10-CM | POA: Diagnosis not present

## 2015-03-21 HISTORY — PX: ESOPHAGOGASTRODUODENOSCOPY: SHX1529

## 2015-03-21 HISTORY — DX: Gastritis, unspecified, without bleeding: K29.70

## 2015-03-21 MED ORDER — SODIUM CHLORIDE 0.9 % IV SOLN: 500.0000 mL | INTRAVENOUS | Status: DC

## 2015-03-21 NOTE — Progress Notes (Signed)
Called to room to assist during endoscopic procedure.  Patient ID and intended procedure confirmed with present staff. Received instructions for my participation in the procedure from the performing physician.  

## 2015-03-21 NOTE — Patient Instructions (Signed)
YOU HAD AN ENDOSCOPIC PROCEDURE TODAY AT THE [facility]:   Refer to the procedure report that was given to you for any specific questions about what was found during the examination.  If the procedure report does not answer your questions, please call your gastroenterologist to clarify.  If you requested that your care partner not be given the details of your procedure findings, then the procedure report has been included in a sealed envelope for you to review at your convenience later.  YOU SHOULD EXPECT: Some feelings of bloating in the abdomen. Passage of more gas than usual.  Walking can help get rid of the air that was put into your GI tract during the procedure and reduce the bloating. If you had a lower endoscopy (such as a colonoscopy or flexible sigmoidoscopy) you may notice spotting of blood in your stool or on the toilet paper. If you underwent a bowel prep for your procedure, you may not have a normal bowel movement for a few days.  Please Note:  You might notice some irritation and congestion in your nose or some drainage.  This is from the oxygen used during your procedure.  There is no need for concern and it should clear up in a day or so.  SYMPTOMS TO REPORT IMMEDIATELY:    Following upper endoscopy (EGD)  Vomiting of blood or coffee ground material  New chest pain or pain under the shoulder blades  Painful or persistently difficult swallowing  New shortness of breath  Fever of 100F or higher  Black, tarry-looking stools  For urgent or emergent issues, a gastroenterologist can be reached at any hour by calling [phone].   DIET: Your first meal following the procedure should be a small meal and then it is ok to progress to your normal diet. Heavy or fried foods are harder to digest and may make you feel nauseous or bloated.  Likewise, meals heavy in dairy and vegetables can increase bloating.  Drink plenty of fluids but you should avoid alcoholic beverages  for 24 hours.  ACTIVITY:  You should plan to take it easy for the rest of today and you should NOT DRIVE or use heavy machinery until tomorrow (because of the sedation medicines used during the test).    FOLLOW UP: Our staff will call the number listed on your records the next business day following your procedure to check on you and address any questions or concerns that you may have regarding the information given to you following your procedure. If we do not reach you, we will leave a message.  However, if you are feeling well and you are not experiencing any problems, there is no need to return our call.  We will assume that you have returned to your regular daily activities without incident.  If any biopsies were taken you will be contacted by phone or by letter within the next 1-3 weeks.  Please call us at [phone] if you have not heard about the biopsies in 3 weeks.    SIGNATURES/CONFIDENTIALITY: You and/or your care partner have signed paperwork which will be entered into your electronic medical record.  These signatures attest to the fact that that the information above on your After Visit Summary has been reviewed and is understood.  Full responsibility of the confidentiality of this discharge information lies with you and/or your care-partner. 

## 2015-03-21 NOTE — Progress Notes (Signed)
Report to PACU, RN, vss, BBS= Clear.  

## 2015-03-21 NOTE — Op Note (Signed)
[place]  [address] [place], [address]   ENDOSCOPY PROCEDURE REPORT  PATIENT: Jason, Mckee  MR#: [id number] BIRTHDATE: [birth year] , 29  yrs. old GENDER: male ENDOSCOPIST: Ladene Artist, MD, [facility] REFERRED BY:  Harland Dingwall, NP PROCEDURE DATE:  03/21/2015 PROCEDURE:  EGD w/ biopsy ASA CLASS:     Class II INDICATIONS:  history of esophageal reflux. MEDICATIONS: Monitored anesthesia care and Propofol 200 mg IV TOPICAL ANESTHETIC: none DESCRIPTION OF PROCEDURE: After the risks benefits and alternatives of the procedure were thoroughly explained, informed consent was obtained.  The LB [id number] endoscope was introduced through the mouth and advanced to the second portion of the duodenum , Without limitations.  The instrument was slowly withdrawn as the mucosa was fully examined.    STOMACH: Gastritis, moderate, nonerosive was found in the gastric body and gastric antrum.  Multiple biopsies were performed.   The stomach otherwise appeared normal. ESOPHAGUS: The mucosa of the esophagus appeared normal. DUODENUM: The duodenal mucosa showed no abnormalities.  Retroflexed views revealed no abnormalities.     The scope was then withdrawn from the patient and the procedure completed.  COMPLICATIONS: There were no immediate complications.  ENDOSCOPIC IMPRESSION: 1.   Gastritis in the gastric body and gastric antrum; multiple biopsies performed 2.   The EGD otherwise appeared normal  RECOMMENDATIONS: 1.  Anti-reflux regimen long term 2.  Continue PPI qam long term 3.  Await pathology results 4.  Follow up with PCP for ongoing care  eSigned:  Ladene Artist, MD, [facility] 03/21/2015 10:22 AM

## 2015-03-22 ENCOUNTER — Telehealth: Payer: Self-pay

## 2015-03-22 NOTE — Telephone Encounter (Signed)
No answer, left voicemail message.

## 2015-03-27 ENCOUNTER — Encounter: Payer: Self-pay | Admitting: Gastroenterology

## 2015-06-28 ENCOUNTER — Other Ambulatory Visit: Payer: Self-pay | Admitting: Medical

## 2015-06-28 ENCOUNTER — Telehealth: Payer: Self-pay | Admitting: Family Medicine

## 2015-06-28 MED ORDER — AMPHETAMINE-DEXTROAMPHETAMINE 20 MG PO TABS: 20.0000 mg | ORAL_TABLET | Freq: Two times a day (BID) | ORAL | Status: DC

## 2015-06-28 NOTE — Telephone Encounter (Signed)
Pt's father, Antony Haste, called requesting refill on Amphetamine 20mg . He said he thinks pt has taken his last pill today and will need the refill today. Call dad back at [phone]

## 2015-06-28 NOTE — Telephone Encounter (Signed)
Called pt's father to advise that RX ready for pickup and pt due for CPE in March.

## 2015-06-28 NOTE — Telephone Encounter (Signed)
rx ready but needs physical appt in March

## 2015-07-19 HISTORY — PX: NO PAST SURGERIES: SHX2092

## 2015-07-21 ENCOUNTER — Encounter: Payer: Self-pay | Admitting: Medical

## 2015-07-21 ENCOUNTER — Ambulatory Visit (INDEPENDENT_AMBULATORY_CARE_PROVIDER_SITE_OTHER): Payer: Medicaid Other | Admitting: Medical

## 2015-07-21 VITALS — BP 120/82 | HR 56 | Ht 73.0 in | Wt 257.0 lb

## 2015-07-21 DIAGNOSIS — Z23 Encounter for immunization: Secondary | ICD-10-CM

## 2015-07-21 DIAGNOSIS — F909 Attention-deficit hyperactivity disorder, unspecified type: Secondary | ICD-10-CM | POA: Diagnosis not present

## 2015-07-21 DIAGNOSIS — Z113 Encounter for screening for infections with a predominantly sexual mode of transmission: Secondary | ICD-10-CM

## 2015-07-21 DIAGNOSIS — K219 Gastro-esophageal reflux disease without esophagitis: Secondary | ICD-10-CM | POA: Insufficient documentation

## 2015-07-21 DIAGNOSIS — K2970 Gastritis, unspecified, without bleeding: Secondary | ICD-10-CM | POA: Diagnosis not present

## 2015-07-21 DIAGNOSIS — R7301 Impaired fasting glucose: Secondary | ICD-10-CM | POA: Diagnosis not present

## 2015-07-21 DIAGNOSIS — E669 Obesity, unspecified: Secondary | ICD-10-CM

## 2015-07-21 DIAGNOSIS — F819 Developmental disorder of scholastic skills, unspecified: Secondary | ICD-10-CM

## 2015-07-21 DIAGNOSIS — R0683 Snoring: Secondary | ICD-10-CM | POA: Diagnosis not present

## 2015-07-21 DIAGNOSIS — G478 Other sleep disorders: Secondary | ICD-10-CM

## 2015-07-21 DIAGNOSIS — R5383 Other fatigue: Secondary | ICD-10-CM | POA: Diagnosis not present

## 2015-07-21 DIAGNOSIS — Z13220 Encounter for screening for lipoid disorders: Secondary | ICD-10-CM | POA: Insufficient documentation

## 2015-07-21 DIAGNOSIS — Z0000 Encounter for general adult medical examination without abnormal findings: Secondary | ICD-10-CM

## 2015-07-21 DIAGNOSIS — F988 Other specified behavioral and emotional disorders with onset usually occurring in childhood and adolescence: Secondary | ICD-10-CM

## 2015-07-21 LAB — HEMOGLOBIN A1C
Hgb A1c MFr Bld: 5.3 % (ref <5.7)
Mean Plasma Glucose: 105 mg/dL (ref <117)

## 2015-07-21 LAB — LIPID PANEL
CHOLESTEROL: 199 mg/dL (ref 125–200)
HDL: 58 mg/dL (ref >=40)
LDL Cholesterol: 129 mg/dL (ref <130)
TRIGLYCERIDES: 58 mg/dL (ref <150)
Total CHOL/HDL Ratio: 3.4 Ratio (ref <=5.0)
VLDL: 12 mg/dL (ref <30)

## 2015-07-21 LAB — POCT URINALYSIS DIPSTICK
Bilirubin, UA: NEGATIVE
Blood, UA: NEGATIVE
Glucose, UA: NEGATIVE
KETONES UA: NEGATIVE
Leukocytes, UA: NEGATIVE
Nitrite, UA: NEGATIVE
Protein, UA: NEGATIVE
Urobilinogen, UA: NEGATIVE
pH, UA: 6

## 2015-07-21 LAB — CBC
HEMATOCRIT: 46.1 % (ref 39.0–52.0)
Hemoglobin: 16 g/dL (ref 13.0–17.0)
MCH: 29 pg (ref 26.0–34.0)
MCHC: 34.7 g/dL (ref 30.0–36.0)
MCV: 83.5 fL (ref 78.0–100.0)
MPV: 10 fL (ref 8.6–12.4)
Platelets: 265 10*3/uL (ref 150–400)
RBC: 5.52 MIL/uL (ref 4.22–5.81)
RDW: 14.2 % (ref 11.5–15.5)
WBC: 4.8 10*3/uL (ref 4.0–10.5)

## 2015-07-21 LAB — COMPREHENSIVE METABOLIC PANEL
ALBUMIN: 4.4 g/dL (ref 3.6–5.1)
ALT: 38 U/L (ref 9–46)
AST: 19 U/L (ref 10–40)
Alkaline Phosphatase: 37 U/L — ABNORMAL LOW (ref 40–115)
BUN: 14 mg/dL (ref 7–25)
CALCIUM: 9.4 mg/dL (ref 8.6–10.3)
CHLORIDE: 104 mmol/L (ref 98–110)
CO2: 23 mmol/L (ref 20–31)
Creat: 1.03 mg/dL (ref 0.60–1.35)
Glucose, Bld: 85 mg/dL (ref 65–99)
Potassium: 4.1 mmol/L (ref 3.5–5.3)
SODIUM: 139 mmol/L (ref 135–146)
TOTAL PROTEIN: 7.1 g/dL (ref 6.1–8.1)
Total Bilirubin: 0.7 mg/dL (ref 0.2–1.2)
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
## 2015-07-22 LAB — GC/CHLAMYDIA PROBE AMP
CT Probe RNA: NOT DETECTED
GC PROBE AMP APTIMA: NOT DETECTED

## 2015-07-22 LAB — HIV ANTIBODY (ROUTINE TESTING W REFLEX): HIV: NONREACTIVE

## 2015-07-22 LAB — TSH: TSH: 1.69 mIU/L (ref 0.40–4.50)

## 2015-07-22 LAB — RPR

## 2015-07-22 LAB — TESTOSTERONE: TESTOSTERONE: 381 ng/dL (ref 250–827)

## 2015-07-26 NOTE — Addendum Note (Signed)
Addended by: Billie Lade on: 07/26/2015 11:37 AM   Modules accepted: Orders, SmartSet

## 2015-07-26 NOTE — Addendum Note (Signed)
Addended by: Billie Lade on: 07/26/2015 08:58 AM   Modules accepted: Orders, SmartSet

## 2015-08-09 ENCOUNTER — Emergency Department (HOSPITAL_BASED_OUTPATIENT_CLINIC_OR_DEPARTMENT_OTHER)
Admission: EM | Admit: 2015-08-09 | Discharge: 2015-08-09 | Disposition: A | Discharge status: Home / Self Care | Payer: Medicaid Other | Attending: Emergency Medicine | Admitting: Emergency Medicine

## 2015-08-09 ENCOUNTER — Emergency Department (HOSPITAL_BASED_OUTPATIENT_CLINIC_OR_DEPARTMENT_OTHER): Payer: Medicaid Other

## 2015-08-09 ENCOUNTER — Encounter (HOSPITAL_BASED_OUTPATIENT_CLINIC_OR_DEPARTMENT_OTHER): Payer: Self-pay | Admitting: Emergency Medicine

## 2015-08-09 DIAGNOSIS — Z8619 Personal history of other infectious and parasitic diseases: Secondary | ICD-10-CM | POA: Diagnosis not present

## 2015-08-09 DIAGNOSIS — E669 Obesity, unspecified: Secondary | ICD-10-CM | POA: Insufficient documentation

## 2015-08-09 DIAGNOSIS — S6992XA Unspecified injury of left wrist, hand and finger(s), initial encounter: Secondary | ICD-10-CM | POA: Diagnosis present

## 2015-08-09 DIAGNOSIS — W228XXA Striking against or struck by other objects, initial encounter: Secondary | ICD-10-CM | POA: Diagnosis not present

## 2015-08-09 DIAGNOSIS — M79602 Pain in left arm: Secondary | ICD-10-CM

## 2015-08-09 DIAGNOSIS — Y9289 Other specified places as the place of occurrence of the external cause: Secondary | ICD-10-CM | POA: Insufficient documentation

## 2015-08-09 DIAGNOSIS — Y9389 Activity, other specified: Secondary | ICD-10-CM | POA: Insufficient documentation

## 2015-08-09 DIAGNOSIS — Z79899 Other long term (current) drug therapy: Secondary | ICD-10-CM | POA: Diagnosis not present

## 2015-08-09 DIAGNOSIS — F909 Attention-deficit hyperactivity disorder, unspecified type: Secondary | ICD-10-CM | POA: Diagnosis not present

## 2015-08-09 DIAGNOSIS — G8929 Other chronic pain: Secondary | ICD-10-CM | POA: Insufficient documentation

## 2015-08-09 DIAGNOSIS — Z8719 Personal history of other diseases of the digestive system: Secondary | ICD-10-CM | POA: Diagnosis not present

## 2015-08-09 DIAGNOSIS — Y998 Other external cause status: Secondary | ICD-10-CM | POA: Insufficient documentation

## 2015-08-09 MED ORDER — ACETAMINOPHEN 325 MG PO TABS
650.0000 mg | ORAL_TABLET | Freq: Once | ORAL | Status: AC
  Administered 2015-08-09: 650 mg via ORAL
  Filled 2015-08-09: qty 2

## 2015-08-09 NOTE — ED Provider Notes (Signed)
CSN: [id number]     Arrival date & time 08/09/15  1452 History   First MD Initiated Contact with Patient 08/09/15 1553     Chief Complaint  Patient presents with  . Wrist Pain   HPI  [name] is a 30 year old male presenting with left forearm pain. 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 The machine was on a mobile arm which swung towards him and struck him in the left hand and wrist. He states that it was a very large, heavy metal machine. He is complaining of severe, aching pain over the palmar surface of the left hand and the left forearm. The pain does not include the elbow or radiate into the fingers or shoulder. Pain is worsened with movement at the wrist. He states that immediately after the accident he felt that he could not move his hand but he is slowly regaining mobility. He has only used ice for pain relief and states this has not helped much. He denies numbness, tingling or loss of sensation in the digits. Denies other wounds sustained.   Past Medical History  Diagnosis Date  . Chronic headaches     not frequent as of 07/2015  . Learning disability   . ADD (attention deficit disorder)   . Depression   . Obesity   . Wears glasses   . Tinea corporis   . Anxiety   . Gastritis 03/2015    EGD Dr. [name]  . H/O echocardiogram 10/2007    normal stress echo, Dr. Jenkins Rouge   Past Surgical History  Procedure Laterality Date  . No past surgeries  07/2015  . Esophagogastroduodenoscopy  03/2015    gastritis; Dr. [name]   Family History  Problem Relation Age of Onset  . Hypertension Mother   . Deep vein thrombosis Mother   . Multiple sclerosis Father   . Diabetes Paternal Aunt   . Heart disease Paternal Uncle   . Prostate cancer Paternal Grandfather   . Stroke Neg Hx    Social History  Substance Use Topics  . Smoking status: Never Smoker   . Smokeless tobacco: Never Used  . Alcohol Use: No    Review of  Systems  Musculoskeletal: Positive for arthralgias.  All other systems reviewed and are negative.     Allergies  Review of patient's allergies indicates no known allergies.  Home Medications   Prior to Admission medications   Medication Sig Start Date End Date Taking? Authorizing Provider  amphetamine-dextroamphetamine (ADDERALL) 20 MG tablet Take 1 tablet (20 mg total) by mouth 2 (two) times daily. 06/28/15   Camelia Eng Tysinger, PA-C  pantoprazole (PROTONIX) 40 MG tablet Take 1 tablet (40 mg total) by mouth daily. 02/02/15   Lori P Hvozdovic, PA-C   BP 128/96 mmHg  Pulse 76  Temp(Src) 98.2 F (36.8 C) (Oral)  Resp 18  Ht 6\' 1"  (1.854 m)  Wt 116.574 kg  BMI 33.91 kg/m2  SpO2 99% Physical Exam  Constitutional: He appears well-developed and well-nourished. No distress.  HENT:  Head: Normocephalic and atraumatic.  Right Ear: External ear normal.  Left Ear: External ear normal.  Eyes: Conjunctivae are normal. Right eye exhibits no discharge. Left eye exhibits no discharge. No scleral icterus.  Neck: Normal range of motion.  Cardiovascular: Normal rate and intact distal pulses.   Left radial pulse palpable  Pulmonary/Chest: Effort normal.  Musculoskeletal: Normal range of motion.  Left forearm: He exhibits tenderness. He exhibits no swelling, no deformity and no laceration.       Arms:      Left hand: He exhibits tenderness. He exhibits normal range of motion, normal capillary refill, no deformity and no swelling. Normal sensation noted.  Generalized tenderness of the left forearm and palm. No obvious swelling or deformities. Full range of motion of the digits, wrist and elbow intact. Patient is able to make a fist. Compartments are soft. No abrasions or lacerations. No ecchymosis or other color changes of the skin.   Neurological: He is alert. Coordination normal.  4/5 grip strength secondary to pain. 5/5 strength at the elbow. Sensation to light touch intact over the left  upper extremity.  Skin: Skin is warm and dry.  Psychiatric: He has a normal mood and affect. His behavior is normal.  Nursing note and vitals reviewed.   ED Course  Procedures (including critical care time) Labs Review Labs Reviewed - No data to display  Imaging Review Dg Forearm Left  08/09/2015  CLINICAL DATA:  Blunt trauma to left forearm with sausage grinder. Forearm pain. Initial encounter. EXAM: LEFT FOREARM - 2 VIEW COMPARISON:  None. FINDINGS: There is no evidence of fracture or other focal bone lesions. Soft tissues are unremarkable. IMPRESSION: Negative. Electronically Signed   By: Earle Gell M.D.   On: 08/09/2015 16:35   Dg Hand Complete Left  08/09/2015  CLINICAL DATA:  Blunt trauma to the hand and forearm from a sausage grinder, initial encounter EXAM: LEFT HAND - COMPLETE 3+ VIEW COMPARISON:  None. FINDINGS: There is no evidence of fracture or dislocation. There is no evidence of arthropathy or other focal bone abnormality. Soft tissues are unremarkable. IMPRESSION: No acute abnormality noted. Electronically Signed   By: Inez Catalina M.D.   On: 08/09/2015 16:32   I have personally reviewed and evaluated these images and lab results as part of my medical decision-making.   EKG Interpretation None      MDM   Final diagnoses:  Left arm pain   Patient presenting with left hand and forearm pain after being struck by machinery. Left arm is neurovascularly intact with FROM. Patient X-Ray negative for obvious fracture or dislocation. Pain managed in ED with tylenol. Brace given for comfort and conservative therapy recommended. Discussed RICE therapy and use of OTC pain relievers. Pt advised to follow up with PCP if symptoms persist. Return precautions discussed at bedside and given in discharge paperwork. Pt is stable for discharge.  Lahoma Crocker Adelle Zachar, PA-C 08/09/15 1645  Julianne Rice, MD 08/10/15 [phone]

## 2015-08-09 NOTE — Discharge Instructions (Signed)
Musculoskeletal Pain Musculoskeletal pain is muscle and boney aches and pains. These pains can occur in any part of the body. Your caregiver may treat you without knowing the cause of the pain. They may treat you if blood or urine tests, X-rays, and other tests were normal.  CAUSES There is often not a definite cause or reason for these pains. These pains may be caused by a type of germ (virus). The discomfort may also come from overuse. Overuse includes working out too hard when your body is not fit. Boney aches also come from weather changes. Bone is sensitive to atmospheric pressure changes. HOME CARE INSTRUCTIONS   Ask when your test results will be ready. Make sure you get your test results.  Only take over-the-counter or prescription medicines for pain, discomfort, or fever as directed by your caregiver. If you were given medications for your condition, do not drive, operate machinery or power tools, or sign legal documents for 24 hours. Do not drink alcohol. Do not take sleeping pills or other medications that may interfere with treatment.  Continue all activities unless the activities cause more pain. When the pain lessens, slowly resume normal activities. Gradually increase the intensity and duration of the activities or exercise.  During periods of severe pain, bed rest may be helpful. Lay or sit in any position that is comfortable.  Putting ice on the injured area.  Put ice in a bag.  Place a towel between your skin and the bag.  Leave the ice on for 15 to 20 minutes, 3 to 4 times a day.  Follow up with your caregiver for continued problems and no reason can be found for the pain. If the pain becomes worse or does not go away, it may be necessary to repeat tests or do additional testing. Your caregiver may need to look further for a possible cause. SEEK IMMEDIATE MEDICAL CARE IF:  You have pain that is getting worse and is not relieved by medications.  You develop chest pain  that is associated with shortness or breath, sweating, feeling sick to your stomach (nauseous), or throw up (vomit).  Your pain becomes localized to the abdomen.  You develop any new symptoms that seem different or that concern you. MAKE SURE YOU:   Understand these instructions.  Will watch your condition.  Will get help right away if you are not doing well or get worse.   This information is not intended to replace advice given to you by your health care provider. Make sure you discuss any questions you have with your health care provider.   Document Released: 05/06/2005 Document Revised: 07/29/2011 Document Reviewed: 01/08/2013 Elsevier Interactive Patient Education 2016 [place].  Cryotherapy Cryotherapy means treatment with cold. Ice or gel packs can be used to reduce both pain and swelling. Ice is the most helpful within the first 24 to 48 hours after an injury or flare-up from overusing a muscle or joint. Sprains, strains, spasms, burning pain, shooting pain, and aches can all be eased with ice. Ice can also be used when recovering from surgery. Ice is effective, has very few side effects, and is safe for most people to use. PRECAUTIONS  Ice is not a safe treatment option for people with:  Raynaud phenomenon. This is a condition affecting small blood vessels in the extremities. Exposure to cold may cause your problems to return.  Cold hypersensitivity. There are many forms of cold hypersensitivity, including:  Cold urticaria. Red, itchy hives appear on the  skin when the tissues begin to warm after being iced.  Cold erythema. This is a red, itchy rash caused by exposure to cold.  Cold hemoglobinuria. Red blood cells break down when the tissues begin to warm after being iced. The hemoglobin that carry oxygen are passed into the urine because they cannot combine with blood proteins fast enough.  Numbness or altered sensitivity in the area being iced. If you have any of the  following conditions, do not use ice until you have discussed cryotherapy with your caregiver:  Heart conditions, such as arrhythmia, angina, or chronic heart disease.  High blood pressure.  Healing wounds or open skin in the area being iced.  Current infections.  Rheumatoid arthritis.  Poor circulation.  Diabetes. Ice slows the blood flow in the region it is applied. This is beneficial when trying to stop inflamed tissues from spreading irritating chemicals to surrounding tissues. However, if you expose your skin to cold temperatures for too long or without the proper protection, you can damage your skin or nerves. Watch for signs of skin damage due to cold. HOME CARE INSTRUCTIONS Follow these tips to use ice and cold packs safely.  Place a dry or damp towel between the ice and skin. A damp towel will cool the skin more quickly, so you may need to shorten the time that the ice is used.  For a more rapid response, add gentle compression to the ice.  Ice for no more than 10 to 20 minutes at a time. The bonier the area you are icing, the less time it will take to get the benefits of ice.  Check your skin after 5 minutes to make sure there are no signs of a poor response to cold or skin damage.  Rest 20 minutes or more between uses.  Once your skin is numb, you can end your treatment. You can test numbness by very lightly touching your skin. The touch should be so light that you do not see the skin dimple from the pressure of your fingertip. When using ice, most people will feel these normal sensations in this order: cold, burning, aching, and numbness.  Do not use ice on someone who cannot communicate their responses to pain, such as small children or people with dementia. HOW TO MAKE AN ICE PACK Ice packs are the most common way to use ice therapy. Other methods include ice massage, ice baths, and cryosprays. Muscle creams that cause a cold, tingly feeling do not offer the same  benefits that ice offers and should not be used as a substitute unless recommended by your caregiver. To make an ice pack, do one of the following:  Place crushed ice or a bag of frozen vegetables in a sealable plastic bag. Squeeze out the excess air. Place this bag inside another plastic bag. Slide the bag into a pillowcase or place a damp towel between your skin and the bag.  Mix 3 parts water with 1 part rubbing alcohol. Freeze the mixture in a sealable plastic bag. When you remove the mixture from the freezer, it will be slushy. Squeeze out the excess air. Place this bag inside another plastic bag. Slide the bag into a pillowcase or place a damp towel between your skin and the bag. SEEK MEDICAL CARE IF:  You develop white spots on your skin. This may give the skin a blotchy (mottled) appearance.  Your skin turns blue or pale.  Your skin becomes waxy or hard.  Your swelling gets  worse. MAKE SURE YOU:   Understand these instructions.  Will watch your condition.  Will get help right away if you are not doing well or get worse.   This information is not intended to replace advice given to you by your health care provider. Make sure you discuss any questions you have with your health care provider.   Document Released: 12/31/2010 Document Revised: 05/27/2014 Document Reviewed: 12/31/2010 Elsevier Interactive Patient Education 2016 [place] therapy can help ease sore, stiff, injured, and tight muscles and joints. Heat relaxes your muscles, which may help ease your pain.  RISKS AND COMPLICATIONS If you have any of the following conditions, do not use heat therapy unless your health care provider has approved:  Poor circulation.  Healing wounds or scarred skin in the area being treated.  Diabetes, heart disease, or high blood pressure.  Not being able to feel (numbness) the area being treated.  Unusual swelling of the area being treated.  Active  infections.  Blood clots.  Cancer.  Inability to communicate pain. This may include young children and people who have problems with their brain function (dementia).  Pregnancy. Heat therapy should only be used on old, pre-existing, or long-lasting (chronic) injuries. Do not use heat therapy on new injuries unless directed by your health care provider. HOW TO USE HEAT THERAPY There are several different kinds of heat therapy, including:  Moist heat pack.  Warm water bath.  Hot water bottle.  Electric heating pad.  Heated gel pack.  Heated wrap.  Electric heating pad. Use the heat therapy method suggested by your health care provider. Follow your health care provider's instructions on when and how to use heat therapy. GENERAL HEAT THERAPY RECOMMENDATIONS  Do not sleep while using heat therapy. Only use heat therapy while you are awake.  Your skin may turn pink while using heat therapy. Do not use heat therapy if your skin turns red.  Do not use heat therapy if you have new pain.  High heat or long exposure to heat can cause burns. Be careful when using heat therapy to avoid burning your skin.  Do not use heat therapy on areas of your skin that are already irritated, such as with a rash or sunburn. SEEK MEDICAL CARE IF:  You have blisters, redness, swelling, or numbness.  You have new pain.  Your pain is worse. MAKE SURE YOU:  Understand these instructions.  Will watch your condition.  Will get help right away if you are not doing well or get worse.   This information is not intended to replace advice given to you by your health care provider. Make sure you discuss any questions you have with your health care provider.   Document Released: 07/29/2011 Document Revised: 05/27/2014 Document Reviewed: 06/29/2013 Elsevier Interactive Patient Education [employer].

## 2015-08-09 NOTE — ED Notes (Signed)
Patient states that he was at [employer] school and a sausage grinder came back and hit his in the left hand, wrist and arm. The patient reports that he can not move it. CMS intact

## 2015-09-05 ENCOUNTER — Telehealth: Payer: Self-pay | Admitting: *Deleted

## 2015-09-05 NOTE — Telephone Encounter (Signed)
"  I need to know date of surgery."

## 2015-09-05 NOTE — Telephone Encounter (Signed)
I called and left him a message that he will have to call and schedule an appointment for a consultation with Dr. Milinda Pointer.  Then we can schedule procedure.

## 2015-10-01 ENCOUNTER — Ambulatory Visit (HOSPITAL_BASED_OUTPATIENT_CLINIC_OR_DEPARTMENT_OTHER): Payer: Medicaid Other | Attending: Medical | Admitting: Internal Medicine

## 2015-10-01 VITALS — Ht 73.0 in | Wt 267.0 lb

## 2015-10-01 DIAGNOSIS — Z79899 Other long term (current) drug therapy: Secondary | ICD-10-CM | POA: Diagnosis not present

## 2015-10-01 DIAGNOSIS — R0683 Snoring: Secondary | ICD-10-CM | POA: Insufficient documentation

## 2015-10-01 DIAGNOSIS — R5383 Other fatigue: Secondary | ICD-10-CM | POA: Diagnosis present

## 2015-10-01 DIAGNOSIS — G478 Other sleep disorders: Secondary | ICD-10-CM

## 2015-10-01 DIAGNOSIS — G4700 Insomnia, unspecified: Secondary | ICD-10-CM | POA: Diagnosis not present

## 2015-10-08 DIAGNOSIS — G478 Other sleep disorders: Secondary | ICD-10-CM

## 2015-10-08 DIAGNOSIS — R5383 Other fatigue: Secondary | ICD-10-CM

## 2015-10-08 DIAGNOSIS — R0683 Snoring: Secondary | ICD-10-CM | POA: Diagnosis not present

## 2015-10-08 NOTE — Procedures (Signed)
  Patient Name: [name], [name] Date: 10/01/2015 Gender: Male D.O.B: 1985/12/07 Age (years): 29 Referring Provider: [name] Height (inches): 73 Interpreting Physician: Baird Lyons MD, ABSM Weight (lbs): 257 RPSGT: Joni Reining BMI: 34 MRN: [id number] Neck Size: 17.50 CLINICAL INFORMATION Sleep Study Type: NPSG Indication for sleep study: Fatigue, Snoring Epworth Sleepiness Score: 3  SLEEP STUDY TECHNIQUE As per the AASM Manual for the Scoring of Sleep and Associated Events v2.3 (April 2016) with a hypopnea requiring 4% desaturations. The channels recorded and monitored were frontal, central and occipital EEG, electrooculogram (EOG), submentalis EMG (chin), nasal and oral airflow, thoracic and abdominal wall motion, anterior tibialis EMG, snore microphone, electrocardiogram, and pulse oximetry.  MEDICATIONS Patient's medications include:charted for review Medications self-administered by patient during sleep study :  PANTOPRAZOLE.  SLEEP ARCHITECTURE The study was initiated at 10:34:28 PM and ended at 4:37:31 AM. Sleep onset time was N/A minutes and the sleep efficiency was 0.0%. The total sleep time was 0.0 minutes. Stage REM latency was N/A minutes. The patient spent N/A% of the night in stage N1 sleep, N/A% in stage N2 sleep, N/A% in stage N3 and N/A% in REM. Alpha intrusion was absent. Supine sleep was N/A%.  RESPIRATORY PARAMETERS The overall apnea/hypopnea index (AHI) was N/A per hour. There were N/A total apneas, including N/A obstructive, N/A central and N/A mixed apneas. There were N/A hypopneas and N/A RERAs. The AHI during Stage REM sleep was N/A per hour. AHI while supine was N/A per hour. The mean oxygen saturation was N/A%. The minimum SpO2 during sleep was N/A%. snoring was noted during this study.  CARDIAC DATA The 2 lead EKG demonstrated sinus rhythm. The mean heart rate was N/A beats per minute. Other EKG findings include: None.  LEG  MOVEMENT DATA The total PLMS were N/A with a resulting PLMS index of N/A. Associated arousal with leg movement index was N/A .  IMPRESSIONS - The patient did not sleep during this study. He reported sleeping until 2:00 PM on the day of the study, and describes irregular sleep schedule. - No respiratory disturbance. - The patient had minimal or no oxygen desaturation during the study (Min O2 = N/A%) - No snoring was audible during this study. - No cardiac abnormalities were noted during this study.  DIAGNOSIS        No sleep recorded          RECOMMENDATIONS - Cannot exclude Delayed Sleep Phase Syndrome. Poor sleep habits with late sleep onset and irregular sleep schedule should be addressed with education on good sleep hygiene.  - Avoid alcohol, sedatives and other CNS depressants that may worsen sleep apnea and disrupt normal sleep architecture. - Sleep hygiene should be reviewed to assess factors that may improve sleep quality. - Weight management and regular exercise should be initiated or continued if appropriate.  Deneise Lever Diplomate, American Board of Sleep Medicine  ELECTRONICALLY SIGNED ON:  10/08/2015, 12:37 PM [place] PH: [phone]   FX: [phone] [place]

## 2015-10-09 ENCOUNTER — Telehealth: Payer: Self-pay | Admitting: Medical

## 2015-10-09 NOTE — Telephone Encounter (Signed)
Pt is aware and will call insurer

## 2015-10-09 NOTE — Telephone Encounter (Signed)
Please let him know that we got results but they showed he neve went to sleep.   Thus, there is still no way to know if he has apnea or not.  I would have him call insurer to find out if he can repeat the study or do in home study since he actually didn't go to sleep?    Sleep recommendations  Have a consistent sleep routine,go to bed same time daily, get up same time each morning including weekends  Avoid alcohol in the late evening  Don't eat or drink within 2 hours of bedtime  don't read or watch TV in the bed  Make sure bedroom is quiet, cool and comfortable without distractions

## 2015-10-09 NOTE — Telephone Encounter (Signed)
Please call sleep lab.  I reviewed the report but he apparently didn't go to sleep during the study.    Per the sleep lab, what happens in this situation?  Do they let him return to repeat the study at no cost?  Do they charge for the study even though he never went to sleep?   I haven't had this before, so not sure what to tell him.  Obviously we have no data since he didn't sleep, other than the fact that he needs to work on sleep hygiene/sleep habits.

## 2015-10-09 NOTE — Telephone Encounter (Signed)
Spoke to sleep center and she confirmed with her manager that even though he did not go to sleep he still stayed there the entireity of the night so he was billed and if we repeat it he will be billed again

## 2015-10-18 ENCOUNTER — Telehealth: Payer: Self-pay | Admitting: Medical

## 2015-10-18 NOTE — Telephone Encounter (Signed)
Is this regarding 12/2014 flat foot diagnosis where he saw podiatry?  If not, then needs appt.  Not sure if this is the concern

## 2015-10-18 NOTE — Telephone Encounter (Signed)
LMTCB

## 2015-10-18 NOTE — Telephone Encounter (Signed)
Pt stated that it is for the flat footed problem and that he was seeing podiatry but is not now. Ok to refer?

## 2015-10-18 NOTE — Telephone Encounter (Signed)
Dad called & states they need another referral to [facility] for pt's foot issues

## 2015-10-19 NOTE — Telephone Encounter (Signed)
LMTCB

## 2015-10-19 NOTE — Telephone Encounter (Signed)
Ok refer back to podiatry or ortho.  Is he wanting to go back to the same podiatrist?

## 2015-10-19 NOTE — Telephone Encounter (Signed)
Faxed to [facility]

## 2016-02-16 ENCOUNTER — Other Ambulatory Visit: Payer: Self-pay | Admitting: Medical

## 2016-02-16 ENCOUNTER — Other Ambulatory Visit: Payer: Self-pay

## 2016-02-16 MED ORDER — PANTOPRAZOLE SODIUM 40 MG PO TBEC: 40.0000 mg | DELAYED_RELEASE_TABLET | Freq: Every day | ORAL | 1 refills | Status: DC

## 2016-02-16 NOTE — Telephone Encounter (Signed)
Is this ok to refill?  

## 2016-02-19 ENCOUNTER — Other Ambulatory Visit: Payer: Self-pay | Admitting: Medical

## 2016-02-20 NOTE — Telephone Encounter (Signed)
You refused this refill request when it was sent to last week.... Doesn't show why it was refused, does the pt need an appt for refills?

## 2016-02-21 ENCOUNTER — Telehealth: Payer: Self-pay | Admitting: Medical

## 2016-02-21 NOTE — Telephone Encounter (Signed)
He needs f/u appt.  This was relayed to pharmacy 2 request ago

## 2016-02-21 NOTE — Telephone Encounter (Signed)
Called pt again & he called back & made appt for tomorrow

## 2016-02-21 NOTE — Telephone Encounter (Signed)
Called pt and left message advising him that he will need to come in for an appointment so advised him to call to schedule an appointment asap since he is on his last pill today

## 2016-02-21 NOTE — Telephone Encounter (Signed)
Pt called to f/u on refill request for Adderall 20mg . Pt says he only has Adderall left for today only

## 2016-02-22 ENCOUNTER — Encounter: Payer: Self-pay | Admitting: Medical

## 2016-02-22 ENCOUNTER — Telehealth: Payer: Self-pay | Admitting: Medical

## 2016-02-22 ENCOUNTER — Ambulatory Visit (INDEPENDENT_AMBULATORY_CARE_PROVIDER_SITE_OTHER): Payer: Medicaid Other | Admitting: Medical

## 2016-02-22 VITALS — BP 122/88 | HR 70 | Ht 73.0 in | Wt 264.0 lb

## 2016-02-22 DIAGNOSIS — F909 Attention-deficit hyperactivity disorder, unspecified type: Secondary | ICD-10-CM

## 2016-02-22 DIAGNOSIS — Z23 Encounter for immunization: Secondary | ICD-10-CM

## 2016-02-22 DIAGNOSIS — R5383 Other fatigue: Secondary | ICD-10-CM

## 2016-02-22 DIAGNOSIS — B360 Pityriasis versicolor: Secondary | ICD-10-CM

## 2016-02-22 DIAGNOSIS — F819 Developmental disorder of scholastic skills, unspecified: Secondary | ICD-10-CM | POA: Diagnosis not present

## 2016-02-22 MED ORDER — TERBINAFINE HCL 1 % EX CREA: 1.0000 "application " | TOPICAL_CREAM | Freq: Two times a day (BID) | CUTANEOUS | 2 refills | Status: DC

## 2016-02-22 MED ORDER — AMPHETAMINE-DEXTROAMPHETAMINE 20 MG PO TABS: 20.0000 mg | ORAL_TABLET | Freq: Two times a day (BID) | ORAL | 0 refills | Status: DC

## 2016-02-22 MED ORDER — KETOCONAZOLE 2 % EX SHAM: 1.0000 "application " | MEDICATED_SHAMPOO | CUTANEOUS | 2 refills | Status: DC

## 2016-02-22 NOTE — Telephone Encounter (Signed)
Please call pharmacy to see what cream was written by pharmacy in [facility].  I assume it was an antifungal such as clotrimazole or nystatin or Lamisil.  Find out quantity and instructions. I will most likely refill this but let me know.

## 2016-02-22 NOTE — Progress Notes (Signed)
Subjective: Chief Complaint  Patient presents with  . Medication Refill    adderall    Here for f/u on ADD.  Been on medication for many years.   Has been on ritalin, vyvanse prior. Does fine on adderall.  Uses adderall daily including weekends.   Is at [employer], graduates hopefully 2018-2019.  Working at [employer].   Exercise - lifting weights, some walking.  Still doing some part time film work.   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  Tinea versicolor - still uses Nizoral shampoo weekly, but ran out of cream for face. Would like refill on this.    Past Medical History:  Diagnosis Date  . ADD (attention deficit disorder)   . Anxiety   . Chronic headaches    not frequent as of 07/2015  . Depression   . Gastritis 03/2015   EGD Dr. [name]  . H/O echocardiogram 10/2007   normal stress echo, Dr. Jenkins Rouge  . Learning disability   . Obesity   . Tinea corporis   . Wears glasses    Current Outpatient Prescriptions on File Prior to Visit  Medication Sig Dispense Refill  . pantoprazole (PROTONIX) 40 MG tablet Take 1 tablet (40 mg total) by mouth daily. 30 tablet 1   No current facility-administered medications on file prior to visit.    ROS as in subjective   Objective: BP 122/88   Pulse 70   Ht 6\' 1"  (1.854 m)   Wt 264 lb (119.7 kg)   SpO2 98%   BMI 34.83 kg/m   Wt Readings from Last 3 Encounters:  02/22/16 264 lb (119.7 kg)  10/01/15 267 lb (121.1 kg)  08/09/15 257 lb (116.6 kg)   Gen: wd, wn, nad Heart: RRR, normal s1, s2, no murmurs Lungs clear Ext: no edema Pulses normal Face: hypopigmented areas of rash on front of face mostly nose to chin    Assessment: Encounter Diagnoses  Name Primary?  . Attention deficit disorder, unspecified hyperactivity presence Yes  . Learning disability   . Other fatigue   . Tinea versicolor   . Need for prophylactic vaccination and inoculation against  influenza       Plan: ADD - doing well on current regimen, has consistent schedule, doing well in school.  C/t same medication, gave 3 scripts printed and post dated.  Fatigue - not currently an issue.  advise he c/t routine exercise, healthy diet, try and lose some weight  Tinea versicolor - c/t Nizoral shampoo 1-2 times per week.  Restart lamisil cream, nizoral shampoo  Counseled on the influenza virus vaccine.  Vaccine information sheet given.  Influenza vaccine given after consent obtained.   Dewy was seen today for medication refill.  Diagnoses and all orders for this visit:  Attention deficit disorder, unspecified hyperactivity presence  Learning disability  Other fatigue  Tinea versicolor  Need for prophylactic vaccination and inoculation against influenza  Other orders -     Flu Vaccine QUAD 36+ mos IM -     Discontinue: amphetamine-dextroamphetamine (ADDERALL) 20 MG tablet; Take 1 tablet (20 mg total) by mouth 2 (two) times daily. -     Discontinue: amphetamine-dextroamphetamine (ADDERALL) 20 MG tablet; Take 1 tablet (20 mg total) by mouth 2 (two) times daily. -     amphetamine-dextroamphetamine (ADDERALL) 20 MG tablet; Take 1 tablet (20 mg total) by mouth 2 (two) times daily. -     terbinafine (  LAMISIL AT) 1 % cream; Apply 1 application topically 2 (two) times daily. -     ketoconazole (NIZORAL) 2 % shampoo; Apply 1 application topically 2 (two) times a week.

## 2016-02-22 NOTE — Telephone Encounter (Addendum)
[name] has called pharmacy they have no record of the cream  noted

## 2016-02-26 ENCOUNTER — Encounter: Payer: Self-pay | Admitting: Medical

## 2016-02-26 NOTE — Telephone Encounter (Deleted)
error 

## 2016-03-05 ENCOUNTER — Encounter: Payer: Self-pay | Admitting: Medical

## 2016-05-23 ENCOUNTER — Ambulatory Visit (INDEPENDENT_AMBULATORY_CARE_PROVIDER_SITE_OTHER): Payer: Medicaid Other | Admitting: Medical

## 2016-05-23 ENCOUNTER — Encounter: Payer: Self-pay | Admitting: Medical

## 2016-05-23 VITALS — BP 138/92 | HR 67 | Wt 262.0 lb

## 2016-05-23 DIAGNOSIS — F411 Generalized anxiety disorder: Secondary | ICD-10-CM

## 2016-05-23 DIAGNOSIS — F988 Other specified behavioral and emotional disorders with onset usually occurring in childhood and adolescence: Secondary | ICD-10-CM | POA: Diagnosis not present

## 2016-05-23 NOTE — Progress Notes (Signed)
Subjective: Chief Complaint  Patient presents with  . discuss stress, possible mental issues    stress, possible mental issures    Here for discussion about stress.  He notes that he is always stressed out, worries a lot.  Has a hard time being himself with auditions feeling tense.  When he was looking at his school paperwork, he saw a comment about mental illness.  Not sure about the comment.    He notes recently feeling down, worrying and stressing about stuff.   Feels stressed all the time for no apparent reason.  Worries about failing, worries about not achieving things he wants to do.  He notes there have been times with auditions where he didn't do as well in the audition that he thought.   Has been in some movies, does acting.  Lifts weights, watches tv, sport, still has enjoyment in those things  Sometimes hears voices that tells him to do bad things.  This happens every other day.  Voices may be telling him to be "abusive to women."  On occasion has thoughts of harming himself.   No plan.  No thoughts specifically to harm to others.     Stays at home with father.  Relationship with father is good.   Hasn't really discussed these feeling with father, he has kept the feelings to himself.  Mother works, lives in [place], has good relationship with her.   hasn't disclosed his feelings to her either.  Has talked to school counselor at [facility] in the past.    Working and in school.    Has felt some depressed mood for a while.    Past Medical History:  Diagnosis Date  . ADD (attention deficit disorder)   . Anxiety   . Chronic headaches    not frequent as of 07/2015  . Depression   . Gastritis 03/2015   EGD Dr. [name]  . H/O echocardiogram 10/2007   normal stress echo, Dr. Jenkins Rouge  . Learning disability   . Obesity   . Tinea corporis   . Wears glasses    Current Outpatient Prescriptions on File Prior to Visit  Medication Sig Dispense Refill  .  amphetamine-dextroamphetamine (ADDERALL) 20 MG tablet Take 1 tablet (20 mg total) by mouth 2 (two) times daily. 60 tablet 0  . ketoconazole (NIZORAL) 2 % shampoo Apply 1 application topically 2 (two) times a week. 120 mL 2  . pantoprazole (PROTONIX) 40 MG tablet Take 1 tablet (40 mg total) by mouth daily. 30 tablet 1   No current facility-administered medications on file prior to visit.    ROS as ins subjective   Objective: BP (!) 138/92   Pulse 67   Wt 262 lb (118.8 kg)   SpO2 97%   BMI 34.57 kg/m   Gen: wd, wn, nad Pysch: pleasant, good eye contact, answers questions appropriately  Reviewed PHQ9 and mood disorder questionnaire.    Assessment: Encounter Diagnoses  Name Primary?  . Attention deficit disorder, unspecified hyperactivity presence Yes  . Anxiety state     Plan: Discussed his concerns. Counseled on the fact that we all face stress, failure sometimes, but its helpful to have some guidance and direction.   I advised he establish with counseling.  Gave list of resources, recommendations below.  Recommendations  I want you to schedule an appointment with a counselor or therapist.    This will help give you ways to deal with stress, anxiety, mood changes  I am concerned  about the voices you hear.  I want you to discuss this with the counselor  We all fail at some point, but we can learn from those opportunities and improve!  You are a smart guy, you have some great opportunities ahead  I recommend you have a church home and seek out good male mentors  F/u with call back or recheck in 1-2 weeks

## 2016-05-23 NOTE — Patient Instructions (Signed)
Recommendations  I want you to schedule an appointment with a counselor or therapist.    This will help give you ways to deal with stress, anxiety, mood changes  I am concerned about the voices you hear.  I want you to discuss this with the counselor  We all fail at some point, but we can learn from those opportunities and improve!  You are a smart guy, you have some great opportunities ahead  I recommend you have a church home and seek out good male mentors  Counseling Services  [facility] [address], [place], [place] [address] [phone]    [facility] [phone]  [email or web address] [address] [place], [place], [place] [address]  Rema Fendt, therapist  Or Toy Cookey, MA, clinical psychologist    Verneda Skill. Clarene Reamer, therapist [phone] [address] [place], [place] [address]   [facility] [phone] [address], [place], [place] [address]   Vic Ripper, therapist [phone] [address] [place], [place] [address]   [facility] [phone] [address], [place], [place] [address]    [facility] [phone] [place], [place], [place] [address]  Lina Sayre, therapist Dr. Lynder Parents, psychiatrist Dr. Milana Huntsman, child psychiatrist   [facility] and staff [phone] [address] [place], [place], [place] [address]  Generalized Anxiety Disorder Generalized anxiety disorder (GAD) is a mental disorder. It interferes with life functions, including relationships, work, and school. GAD is different from normal anxiety, which everyone experiences at some point in their lives in response to specific life events and activities. Normal anxiety actually helps [place] prepare for and get through these life events and activities. Normal anxiety goes  away after the event or activity is over.  GAD causes anxiety that is not necessarily related to specific events or activities. It also causes excess anxiety in proportion to specific events or activities. The anxiety associated with GAD is also difficult to control. GAD can vary from mild to severe. People with severe GAD can have intense waves of anxiety with physical symptoms (panic attacks).  SYMPTOMS The anxiety and worry associated with GAD are difficult to control. This anxiety and worry are related to many life events and activities and also occur more days than not for 6 months or longer. People with GAD also have three or more of the following symptoms (one or more in children):  Restlessness.   Fatigue.  Difficulty concentrating.   Irritability.  Muscle tension.  Difficulty sleeping or unsatisfying sleep. DIAGNOSIS GAD is diagnosed through an assessment by your health care provider. Your health care provider will ask you questions aboutyour mood,physical symptoms, and events in your life. Your health care provider may ask you about your medical history and use of alcohol or drugs, including prescription medicines. Your health care provider may also do a physical exam and blood tests. Certain medical conditions and the use of certain substances can cause symptoms similar to those associated with GAD. Your health care provider may refer you to a mental health specialist for further evaluation. TREATMENT The following therapies are usually used to treat GAD:   Medication. Antidepressant medication usually is prescribed for long-term daily control. Antianxiety medicines may be added in severe cases, especially when panic attacks occur.   Talk therapy (psychotherapy). Certain types of talk therapy can be helpful in treating GAD by providing support, education, and guidance. A  form of talk therapy called cognitive behavioral therapy can teach you healthy ways to think about and react  to daily life events and activities.  Stress managementtechniques. These include yoga, meditation, and exercise and can be very helpful when they are practiced regularly. A mental health specialist can help determine which treatment is best for you. Some people see improvement with one therapy. However, other people require a combination of therapies. This information is not intended to replace advice given to you by your health care provider. Make sure you discuss any questions you have with your health care provider. Document Released: 08/31/2012 Document Revised: 05/27/2014 Document Reviewed: 08/31/2012 Elsevier Interactive Patient Education  2017 [employer].

## 2016-06-27 ENCOUNTER — Telehealth: Payer: Self-pay | Admitting: Family Medicine

## 2016-06-27 NOTE — Telephone Encounter (Signed)
Needs aderral refill   Please call ready

## 2016-06-27 NOTE — Telephone Encounter (Signed)
For you -

## 2016-06-28 ENCOUNTER — Ambulatory Visit: Payer: Medicaid Other | Admitting: Psychology

## 2016-06-28 NOTE — Telephone Encounter (Signed)
Last visit I asked him to scheduled with psychiatry.  Please make sure he did this, as well as appt with therapist.    (they should take over medication given the reasons for the recommendation).  Lets make sure he make the appt's.  FYI - he was hearing voices last visit, had concerns for more serious psychiatry troubles, thus, I wanted him to be seen by psychiatry.

## 2016-06-28 NOTE — Telephone Encounter (Signed)
Called andl/m for pt to call us back. 

## 2016-07-01 ENCOUNTER — Telehealth: Payer: Self-pay | Admitting: Medical

## 2016-07-01 NOTE — Telephone Encounter (Signed)
Pt called for refill of adderall. Please call pt at [phone] when ready.

## 2016-07-01 NOTE — Telephone Encounter (Signed)
As per the prior message, last visit we asked him to establish with psychiatry for appt given some of his new symptoms.  Psychiatrist would normally also take over the ADD medications once he establishes.  It would not be appropriate to get some mental health treatment here and some at psychiatry.   I strongly advised last visit he see psychiatry.   So did he make appt?  If not, why not?    Its important for his overall wellbeing to get evaluation and treatment by psychiatrist ASAP.

## 2016-07-01 NOTE — Telephone Encounter (Signed)
Spoke with patient , per he needs to get adhd meds for pysh.

## 2016-07-02 ENCOUNTER — Ambulatory Visit: Payer: Medicaid Other | Admitting: Licensed Clinical Social Worker

## 2016-07-02 NOTE — Telephone Encounter (Signed)
Pt called into the office to let you know that he is seeing a psychiatrist in [place]. Pt states that his first appt is 07/08/2016. Needs refill until then. [name]

## 2016-07-03 ENCOUNTER — Other Ambulatory Visit: Payer: Self-pay | Admitting: Medical

## 2016-07-03 MED ORDER — AMPHETAMINE-DEXTROAMPHETAMINE 20 MG PO TABS: 20.0000 mg | ORAL_TABLET | Freq: Two times a day (BID) | ORAL | 0 refills | Status: DC

## 2016-07-03 NOTE — Telephone Encounter (Signed)
Ok, I printed a 30 day supply, but f/u as planned with psychiatry

## 2016-07-05 NOTE — Telephone Encounter (Signed)
Pt aware rx printed and signed. He will f/u with psych on Monday.

## 2016-07-08 ENCOUNTER — Ambulatory Visit (INDEPENDENT_AMBULATORY_CARE_PROVIDER_SITE_OTHER): Payer: Medicaid Other | Admitting: Psychology

## 2016-07-08 DIAGNOSIS — F70 Mild intellectual disabilities: Secondary | ICD-10-CM

## 2016-07-08 DIAGNOSIS — F4323 Adjustment disorder with mixed anxiety and depressed mood: Secondary | ICD-10-CM

## 2016-07-08 DIAGNOSIS — F900 Attention-deficit hyperactivity disorder, predominantly inattentive type: Secondary | ICD-10-CM

## 2016-07-22 ENCOUNTER — Encounter: Payer: Self-pay | Admitting: Medical

## 2016-07-22 ENCOUNTER — Ambulatory Visit (INDEPENDENT_AMBULATORY_CARE_PROVIDER_SITE_OTHER): Payer: Medicaid Other | Admitting: Medical

## 2016-07-22 VITALS — BP 122/70 | HR 56 | Ht 73.5 in | Wt 251.0 lb

## 2016-07-22 DIAGNOSIS — L738 Other specified follicular disorders: Secondary | ICD-10-CM

## 2016-07-22 DIAGNOSIS — Z6830 Body mass index (BMI) 30.0-30.9, adult: Secondary | ICD-10-CM | POA: Diagnosis not present

## 2016-07-22 DIAGNOSIS — E669 Obesity, unspecified: Secondary | ICD-10-CM

## 2016-07-22 DIAGNOSIS — F988 Other specified behavioral and emotional disorders with onset usually occurring in childhood and adolescence: Secondary | ICD-10-CM | POA: Diagnosis not present

## 2016-07-22 DIAGNOSIS — Z0000 Encounter for general adult medical examination without abnormal findings: Secondary | ICD-10-CM

## 2016-07-22 DIAGNOSIS — F819 Developmental disorder of scholastic skills, unspecified: Secondary | ICD-10-CM

## 2016-07-22 DIAGNOSIS — Z113 Encounter for screening for infections with a predominantly sexual mode of transmission: Secondary | ICD-10-CM

## 2016-07-22 DIAGNOSIS — B360 Pityriasis versicolor: Secondary | ICD-10-CM | POA: Diagnosis not present

## 2016-07-22 LAB — POCT URINALYSIS DIPSTICK
BILIRUBIN UA: NEGATIVE
Blood, UA: NEGATIVE
Glucose, UA: NEGATIVE
KETONES UA: NEGATIVE
LEUKOCYTES UA: NEGATIVE
NITRITE UA: NEGATIVE
PH UA: 6
PROTEIN UA: NEGATIVE
Spec Grav, UA: >=1.03
Urobilinogen, UA: NEGATIVE

## 2016-07-22 LAB — POCT GLYCOSYLATED HEMOGLOBIN (HGB A1C): Hemoglobin A1C: 5.1

## 2016-07-22 MED ORDER — AMPHETAMINE-DEXTROAMPHETAMINE 20 MG PO TABS: 20.0000 mg | ORAL_TABLET | Freq: Two times a day (BID) | ORAL | 0 refills | Status: DC

## 2016-07-22 MED ORDER — KETOCONAZOLE 2 % EX SHAM: 1.0000 "application " | MEDICATED_SHAMPOO | CUTANEOUS | 2 refills | Status: DC

## 2016-07-22 MED ORDER — TRETINOIN 0.05 % EX CREA: TOPICAL_CREAM | Freq: Every day | CUTANEOUS | 0 refills | Status: DC

## 2016-07-22 MED ORDER — PANTOPRAZOLE SODIUM 40 MG PO TBEC: 40.0000 mg | DELAYED_RELEASE_TABLET | Freq: Every day | ORAL | 1 refills | Status: DC
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
## 2016-08-06 ENCOUNTER — Telehealth: Payer: Self-pay | Admitting: Medical

## 2016-08-06 NOTE — Telephone Encounter (Signed)
P.A. TRETINOIN approved by phone [phone] til 08/01/17 PA# [id number]

## 2016-08-06 NOTE — Telephone Encounter (Signed)
Called pharmacy & Pt informed

## 2016-08-27 ENCOUNTER — Ambulatory Visit (INDEPENDENT_AMBULATORY_CARE_PROVIDER_SITE_OTHER): Payer: Medicaid Other | Admitting: Psychology

## 2016-08-27 DIAGNOSIS — F4323 Adjustment disorder with mixed anxiety and depressed mood: Secondary | ICD-10-CM

## 2016-08-27 DIAGNOSIS — F70 Mild intellectual disabilities: Secondary | ICD-10-CM

## 2016-08-27 DIAGNOSIS — F900 Attention-deficit hyperactivity disorder, predominantly inattentive type: Secondary | ICD-10-CM

## 2016-09-10 ENCOUNTER — Ambulatory Visit (INDEPENDENT_AMBULATORY_CARE_PROVIDER_SITE_OTHER): Payer: Medicaid Other | Admitting: Psychology

## 2016-09-10 DIAGNOSIS — F70 Mild intellectual disabilities: Secondary | ICD-10-CM

## 2016-09-10 DIAGNOSIS — F4323 Adjustment disorder with mixed anxiety and depressed mood: Secondary | ICD-10-CM | POA: Diagnosis not present

## 2016-09-10 DIAGNOSIS — F900 Attention-deficit hyperactivity disorder, predominantly inattentive type: Secondary | ICD-10-CM | POA: Diagnosis not present

## 2016-09-16 ENCOUNTER — Telehealth: Payer: Self-pay | Admitting: Family Medicine

## 2016-09-16 NOTE — Telephone Encounter (Signed)
Dad says dermatologist will not see pt without a referral, dad finally found one that would take medicaid, he needs referral faxed to [facility] in [place]  t# [phone] fax # [phone] Thanks

## 2016-09-16 NOTE — Telephone Encounter (Signed)
Pt needs to come in to be see first before sending a referral. We have don't have any notes  Stated why he needs to see a dermatologist.  Made pt an appt for Thursday.

## 2016-09-19 ENCOUNTER — Encounter: Payer: Self-pay | Admitting: Medical

## 2016-09-19 ENCOUNTER — Ambulatory Visit (INDEPENDENT_AMBULATORY_CARE_PROVIDER_SITE_OTHER): Payer: Medicaid Other | Admitting: Medical

## 2016-09-19 VITALS — BP 128/80 | HR 68 | Wt 254.0 lb

## 2016-09-19 DIAGNOSIS — B360 Pityriasis versicolor: Secondary | ICD-10-CM

## 2016-09-19 DIAGNOSIS — L738 Other specified follicular disorders: Secondary | ICD-10-CM | POA: Diagnosis not present

## 2016-09-19 MED ORDER — TRETINOIN 0.05 % EX CREA: TOPICAL_CREAM | Freq: Every day | CUTANEOUS | 3 refills | Status: DC

## 2016-09-19 MED ORDER — KETOCONAZOLE 2 % EX SHAM: 1.0000 "application " | MEDICATED_SHAMPOO | CUTANEOUS | 3 refills | Status: DC

## 2016-09-19 MED ORDER — TERBINAFINE HCL 1 % EX CREA: 1.0000 "application " | TOPICAL_CREAM | Freq: Two times a day (BID) | CUTANEOUS | 3 refills | Status: DC

## 2016-09-19 NOTE — Patient Instructions (Signed)
Recommendations:  Shower every evening with soap and water  Use Lamisil cream to the facial rash every night until the rash has been gone at least a week.     Lamisil is an anti- fungal cream  Also, use the Nizoral shampoo on the scalp and face for 15 minutes 2-3 days per week.  Leave it on 15 minutes then wash it off  Since this is a yearly fungal skin issue, it will likely try and return in warmer weather every year.  So you will likely need to use the Nizoral shampoo and Lamisil cream for 2-3 weeks at a time during warmer months  The tretinoin cream is for beard bumps.  Use this in the morning daily or daily for a few weeks at a time as needed

## 2016-09-19 NOTE — Progress Notes (Signed)
Subjective: Chief Complaint  Patient presents with  . Rash    rash on face x 1 week , used a skin cream on his face and caused a rash.   Here for rash on cheeks and face started a week ago.  Irritate but not particularly itchy.   Has gotten this yearly and we have discussed this in the past.  No other concerns  Past Medical History:  Diagnosis Date  . ADD (attention deficit disorder)   . Anxiety   . Chronic headaches    not frequent as of 07/2015  . Depression   . Gastritis 03/2015   EGD Dr. [name]  . H/O echocardiogram 10/2007   normal stress echo, Dr. Jenkins Rouge  . Learning disability   . Obesity   . Tinea corporis   . Wears glasses    Current Outpatient Prescriptions on File Prior to Visit  Medication Sig Dispense Refill  . [START ON 09/21/2016] amphetamine-dextroamphetamine (ADDERALL) 20 MG tablet Take 1 tablet (20 mg total) by mouth 2 (two) times daily. 60 tablet 0  . pantoprazole (PROTONIX) 40 MG tablet Take 1 tablet (40 mg total) by mouth daily. 30 tablet 1   No current facility-administered medications on file prior to visit.    ROS as in subjective   Objective: BP 128/80   Pulse 68   Wt 254 lb (115.2 kg)   SpO2 97%   BMI 33.06 kg/m   Gen: wd, wn, nad Skin: bilat cheeks and face centrally and bilat with somewhat pink coloration c/w tinea versicolor, and in beard area particularly on the left with follicular papules with some pink/red inflamed appearance c/w folliculitis barbae   Assessment: Encounter Diagnoses  Name Primary?  . Tinea versicolor Yes  . Folliculitis barbae     Plan: Discussed prior hx/o same, current rash, discussed the 2 diagnosis, treatment recommendations as below.  Patient Instructions  Recommendations:  Shower every evening with soap and water  Use Lamisil cream to the facial rash every night until the rash has been gone at least a week.     Lamisil is an anti- fungal cream  Also, use the Nizoral shampoo on the scalp and  face for 15 minutes 2-3 days per week.  Leave it on 15 minutes then wash it off  Since this is a yearly fungal skin issue, it will likely try and return in warmer weather every year.  So you will likely need to use the Nizoral shampoo and Lamisil cream for 2-3 weeks at a time during warmer months  The tretinoin cream is for beard bumps.  Use this in the morning daily or daily for a few weeks at a time as needed    Roe was seen today for rash.  Diagnoses and all orders for this visit:  Tinea versicolor -     ketoconazole (NIZORAL) 2 % shampoo; Apply 1 application topically 2 (two) times a week.  Folliculitis barbae  Other orders -     terbinafine (LAMISIL AT) 1 % cream; Apply 1 application topically 2 (two) times daily. -     tretinoin (RETIN-A) 0.05 % cream; Apply topically at bedtime.

## 2016-09-24 ENCOUNTER — Ambulatory Visit (INDEPENDENT_AMBULATORY_CARE_PROVIDER_SITE_OTHER): Payer: Medicaid Other | Admitting: Psychology

## 2016-09-24 DIAGNOSIS — F70 Mild intellectual disabilities: Secondary | ICD-10-CM

## 2016-09-24 DIAGNOSIS — F4323 Adjustment disorder with mixed anxiety and depressed mood: Secondary | ICD-10-CM | POA: Diagnosis not present

## 2016-09-24 DIAGNOSIS — F900 Attention-deficit hyperactivity disorder, predominantly inattentive type: Secondary | ICD-10-CM | POA: Diagnosis not present

## 2016-10-08 ENCOUNTER — Ambulatory Visit (INDEPENDENT_AMBULATORY_CARE_PROVIDER_SITE_OTHER): Payer: Medicaid Other | Admitting: Psychology

## 2016-10-08 DIAGNOSIS — F900 Attention-deficit hyperactivity disorder, predominantly inattentive type: Secondary | ICD-10-CM

## 2016-10-08 DIAGNOSIS — F4323 Adjustment disorder with mixed anxiety and depressed mood: Secondary | ICD-10-CM

## 2016-10-08 DIAGNOSIS — F70 Mild intellectual disabilities: Secondary | ICD-10-CM

## 2016-10-22 ENCOUNTER — Ambulatory Visit (INDEPENDENT_AMBULATORY_CARE_PROVIDER_SITE_OTHER): Payer: Medicaid Other | Admitting: Psychology

## 2016-10-22 DIAGNOSIS — F4323 Adjustment disorder with mixed anxiety and depressed mood: Secondary | ICD-10-CM

## 2016-10-22 DIAGNOSIS — F70 Mild intellectual disabilities: Secondary | ICD-10-CM

## 2016-10-22 DIAGNOSIS — F900 Attention-deficit hyperactivity disorder, predominantly inattentive type: Secondary | ICD-10-CM

## 2016-11-05 ENCOUNTER — Ambulatory Visit: Payer: Medicaid Other | Admitting: Psychology

## 2016-11-19 ENCOUNTER — Ambulatory Visit (INDEPENDENT_AMBULATORY_CARE_PROVIDER_SITE_OTHER): Payer: Medicaid Other | Admitting: Psychology

## 2016-11-19 DIAGNOSIS — F70 Mild intellectual disabilities: Secondary | ICD-10-CM

## 2016-11-19 DIAGNOSIS — F900 Attention-deficit hyperactivity disorder, predominantly inattentive type: Secondary | ICD-10-CM

## 2016-11-19 DIAGNOSIS — F4323 Adjustment disorder with mixed anxiety and depressed mood: Secondary | ICD-10-CM

## 2016-12-11 ENCOUNTER — Ambulatory Visit (INDEPENDENT_AMBULATORY_CARE_PROVIDER_SITE_OTHER): Payer: Medicaid Other | Admitting: Medical

## 2016-12-11 ENCOUNTER — Encounter: Payer: Self-pay | Admitting: Medical

## 2016-12-11 VITALS — BP 128/74 | HR 72 | Wt 265.2 lb

## 2016-12-11 DIAGNOSIS — F419 Anxiety disorder, unspecified: Secondary | ICD-10-CM

## 2016-12-11 DIAGNOSIS — B359 Dermatophytosis, unspecified: Secondary | ICD-10-CM | POA: Diagnosis not present

## 2016-12-11 DIAGNOSIS — F988 Other specified behavioral and emotional disorders with onset usually occurring in childhood and adolescence: Secondary | ICD-10-CM

## 2016-12-11 MED ORDER — GRISEOFULVIN ULTRAMICROSIZE 250 MG PO TABS: 250.0000 mg | ORAL_TABLET | Freq: Two times a day (BID) | ORAL | 1 refills | Status: DC

## 2016-12-11 NOTE — Patient Instructions (Signed)
See your therapist, ask them about whether they think you need to be back on Celexa or Abilify?  Or other medication  See what the recommend to help you deal with anxiety  Begin Griseolfunin oral tablet twice daily for 4-6 weeks.   We will refer you to dermatology  Continue the Nizoral shampoo 2-3 days per week, but STOP Lamisil cream for now   Tinea Versicolor Tinea versicolor is a skin infection that is caused by a type of yeast. It causes a rash that shows up as light or dark patches on the skin. It often occurs on the chest, back, neck, or upper arms. The condition usually does not cause other problems. In most cases, it goes away in a few weeks with treatment. The infection cannot be spread by person to another person. Follow these instructions at home:  Take medicines only as told by your doctor.  Scrub your skin every day with a dandruff shampoo as told by your doctor.  Do not scratch your skin in the rash area.  Avoid places that are hot and humid.  Do not use tanning booths.  Try to avoid sweating a lot. Contact a doctor if:  Your symptoms get worse.  You have a fever.  You have redness, swelling, or pain in the area of your rash.  You have fluid, blood, or pus coming from your rash.  Your rash comes back after treatment. This information is not intended to replace advice given to you by your health care provider. Make sure you discuss any questions you have with your health care provider. Document Released: 04/18/2008 Document Revised: 01/07/2016 Document Reviewed: 02/15/2014 Elsevier Interactive Patient Education  2018 [employer].

## 2016-12-11 NOTE — Progress Notes (Signed)
Subjective: Chief Complaint  Patient presents with  . Rash    rash on face and stress out    Here for rash on cheeks and face, worse of late.   I saw him in May for same.  He has been using Nizoral shampoo, Lamisil topical but this isn't helping.   Its worse.  He also has been under more anxiety of late.  Patent examiner.    Past Medical History:  Diagnosis Date  . ADD (attention deficit disorder)   . Anxiety   . Chronic headaches    not frequent as of 07/2015  . Depression   . Gastritis 03/2015   EGD Dr. [name]  . H/O echocardiogram 10/2007   normal stress echo, Dr. Jenkins Rouge  . Learning disability   . Obesity   . Tinea corporis   . Wears glasses    Current Outpatient Prescriptions on File Prior to Visit  Medication Sig Dispense Refill  . amphetamine-dextroamphetamine (ADDERALL) 20 MG tablet Take 1 tablet (20 mg total) by mouth 2 (two) times daily. 60 tablet 0  . ketoconazole (NIZORAL) 2 % shampoo Apply 1 application topically 2 (two) times a week. 120 mL 3  . pantoprazole (PROTONIX) 40 MG tablet Take 1 tablet (40 mg total) by mouth daily. 30 tablet 1   No current facility-administered medications on file prior to visit.    ROS as in subjective   Objective: BP 128/74   Pulse 72   Wt 265 lb 3.2 oz (120.3 kg)   SpO2 98%   BMI 34.51 kg/m   Gen: wd, wn, nad Skin: bilat cheeks and face centrally and bilat with pink coloration c/w tinea and patches of similar on scalp along hairline and within hairline.     Assessment: Encounter Diagnoses  Name Primary?  . Tinea Yes  . Anxiety   . Attention deficit disorder, unspecified hyperactivity presence     Plan: Begin Griseolfunin oral tablet twice daily for 4-6 weeks.   We will refer you to dermatology  Continue the Nizoral shampoo 2-3 days per week, but STOP Lamisil cream for now  See your therapist, ask them about whether they think you need to be back on Celexa or Abilify?  Or other medication  See what the  recommend to help you deal with anxiety  Josephanthony was seen today for rash.  Diagnoses and all orders for this visit:  Tinea -     griseofulvin (GRIS-PEG) 250 MG tablet; Take 1 tablet (250 mg total) by mouth 2 (two) times daily. -     Ambulatory referral to Dermatology  Anxiety -     griseofulvin (GRIS-PEG) 250 MG tablet; Take 1 tablet (250 mg total) by mouth 2 (two) times daily. -     Ambulatory referral to Dermatology  Attention deficit disorder, unspecified hyperactivity presence -     griseofulvin (GRIS-PEG) 250 MG tablet; Take 1 tablet (250 mg total) by mouth 2 (two) times daily. -     Ambulatory referral to Dermatology

## 2016-12-17 ENCOUNTER — Ambulatory Visit: Payer: Medicaid Other | Admitting: Psychology

## 2016-12-31 ENCOUNTER — Ambulatory Visit: Payer: Medicaid Other | Admitting: Psychology

## 2017-01-08 ENCOUNTER — Ambulatory Visit: Payer: Medicaid Other | Admitting: Psychology

## 2017-01-14 ENCOUNTER — Ambulatory Visit: Payer: Medicaid Other | Admitting: Psychology

## 2017-01-18 ENCOUNTER — Other Ambulatory Visit: Payer: Self-pay | Admitting: Medical

## 2017-01-21 NOTE — Telephone Encounter (Signed)
Can pt have a refill on med 

## 2017-01-22 ENCOUNTER — Ambulatory Visit: Payer: Medicaid Other | Admitting: Psychology

## 2017-01-28 ENCOUNTER — Ambulatory Visit: Payer: Medicaid Other | Admitting: Psychology

## 2017-02-05 ENCOUNTER — Telehealth: Payer: Self-pay | Admitting: Medical

## 2017-02-05 ENCOUNTER — Other Ambulatory Visit: Payer: Self-pay | Admitting: Medical

## 2017-02-05 ENCOUNTER — Ambulatory Visit (INDEPENDENT_AMBULATORY_CARE_PROVIDER_SITE_OTHER): Payer: Medicaid Other | Admitting: Psychology

## 2017-02-05 ENCOUNTER — Ambulatory Visit: Payer: Self-pay | Admitting: Psychology

## 2017-02-05 DIAGNOSIS — F70 Mild intellectual disabilities: Secondary | ICD-10-CM

## 2017-02-05 DIAGNOSIS — F900 Attention-deficit hyperactivity disorder, predominantly inattentive type: Secondary | ICD-10-CM

## 2017-02-05 DIAGNOSIS — F4323 Adjustment disorder with mixed anxiety and depressed mood: Secondary | ICD-10-CM

## 2017-02-05 MED ORDER — AMPHETAMINE-DEXTROAMPHETAMINE 20 MG PO TABS: 20.0000 mg | ORAL_TABLET | Freq: Every day | ORAL | 0 refills | Status: DC

## 2017-02-05 MED ORDER — AMPHETAMINE-DEXTROAMPHETAMINE 20 MG PO TABS: 20.0000 mg | ORAL_TABLET | Freq: Two times a day (BID) | ORAL | 0 refills | Status: DC

## 2017-02-05 NOTE — Telephone Encounter (Signed)
rx ready 

## 2017-02-05 NOTE — Telephone Encounter (Signed)
Pt called and is needing a refill on his adderral pt can be reached at [phone] when ready to be picked up

## 2017-02-06 ENCOUNTER — Telehealth: Payer: Self-pay | Admitting: Medical

## 2017-02-06 NOTE — Telephone Encounter (Signed)
Pt advised rx is ready 

## 2017-02-06 NOTE — Telephone Encounter (Signed)
Pt come in requesting a refill on his griseofulvin 250 mg pt uses [id number], [place] pt can be reached at [phone]

## 2017-02-07 ENCOUNTER — Other Ambulatory Visit: Payer: Self-pay

## 2017-02-07 DIAGNOSIS — F988 Other specified behavioral and emotional disorders with onset usually occurring in childhood and adolescence: Secondary | ICD-10-CM

## 2017-02-07 DIAGNOSIS — B359 Dermatophytosis, unspecified: Secondary | ICD-10-CM

## 2017-02-07 DIAGNOSIS — F419 Anxiety disorder, unspecified: Secondary | ICD-10-CM

## 2017-02-07 MED ORDER — GRISEOFULVIN ULTRAMICROSIZE 250 MG PO TABS: 250.0000 mg | ORAL_TABLET | Freq: Two times a day (BID) | ORAL | 1 refills | Status: DC

## 2017-02-07 NOTE — Telephone Encounter (Signed)
Sent refill to pharmacy. 

## 2017-02-07 NOTE — Telephone Encounter (Signed)
Refill one round of this

## 2017-02-11 ENCOUNTER — Ambulatory Visit: Payer: Medicaid Other | Admitting: Psychology

## 2017-02-19 ENCOUNTER — Ambulatory Visit: Payer: Self-pay | Admitting: Psychology

## 2017-02-19 ENCOUNTER — Ambulatory Visit (INDEPENDENT_AMBULATORY_CARE_PROVIDER_SITE_OTHER): Payer: Medicaid Other | Admitting: Psychology

## 2017-02-19 DIAGNOSIS — F4321 Adjustment disorder with depressed mood: Secondary | ICD-10-CM

## 2017-02-19 DIAGNOSIS — F900 Attention-deficit hyperactivity disorder, predominantly inattentive type: Secondary | ICD-10-CM

## 2017-02-19 DIAGNOSIS — F70 Mild intellectual disabilities: Secondary | ICD-10-CM

## 2017-02-23 IMAGING — CR DG FOREARM 2V*L*
2 series · 2 of 2 positions shown · non-contrast
Comparison: None.

CLINICAL DATA: Blunt trauma to left forearm with sausage grinder.
Forearm pain. Initial encounter.

EXAM:
LEFT FOREARM - 2 VIEW

[x forearm ap left]
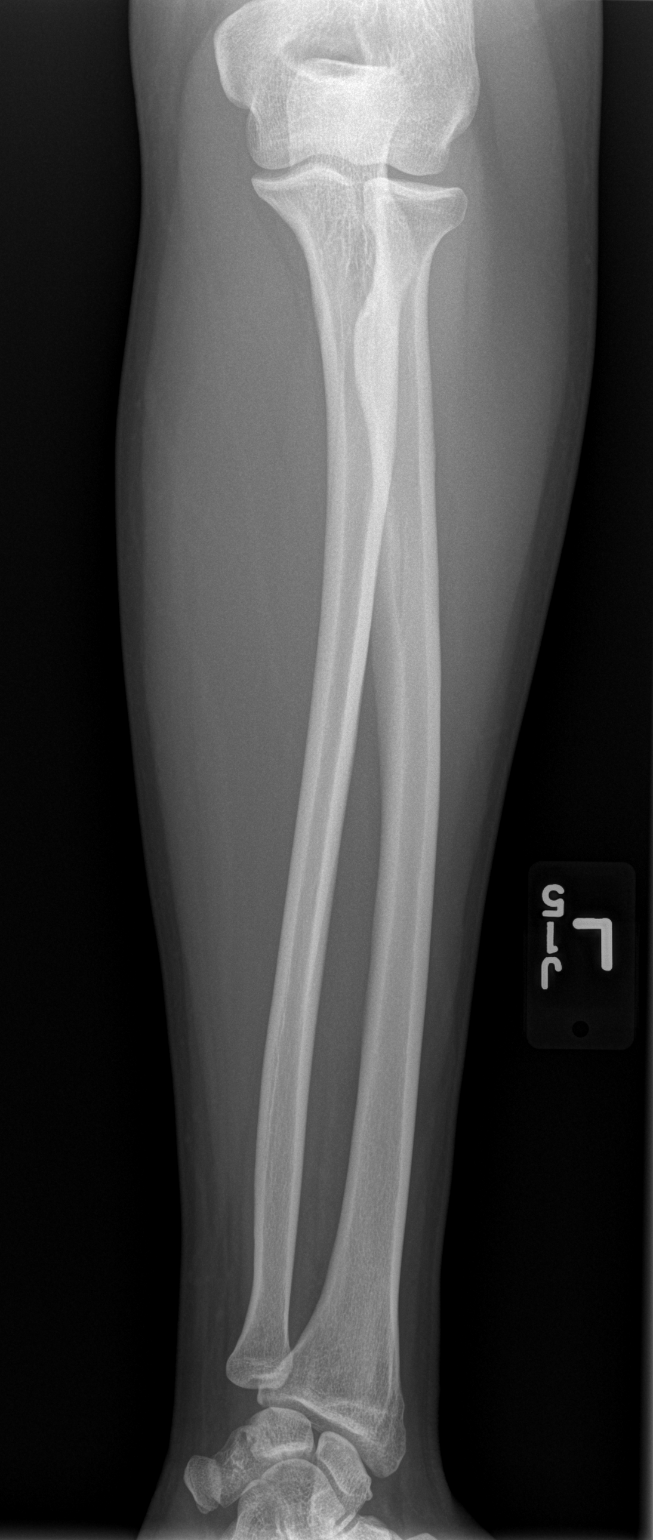

[x forearm lat left]
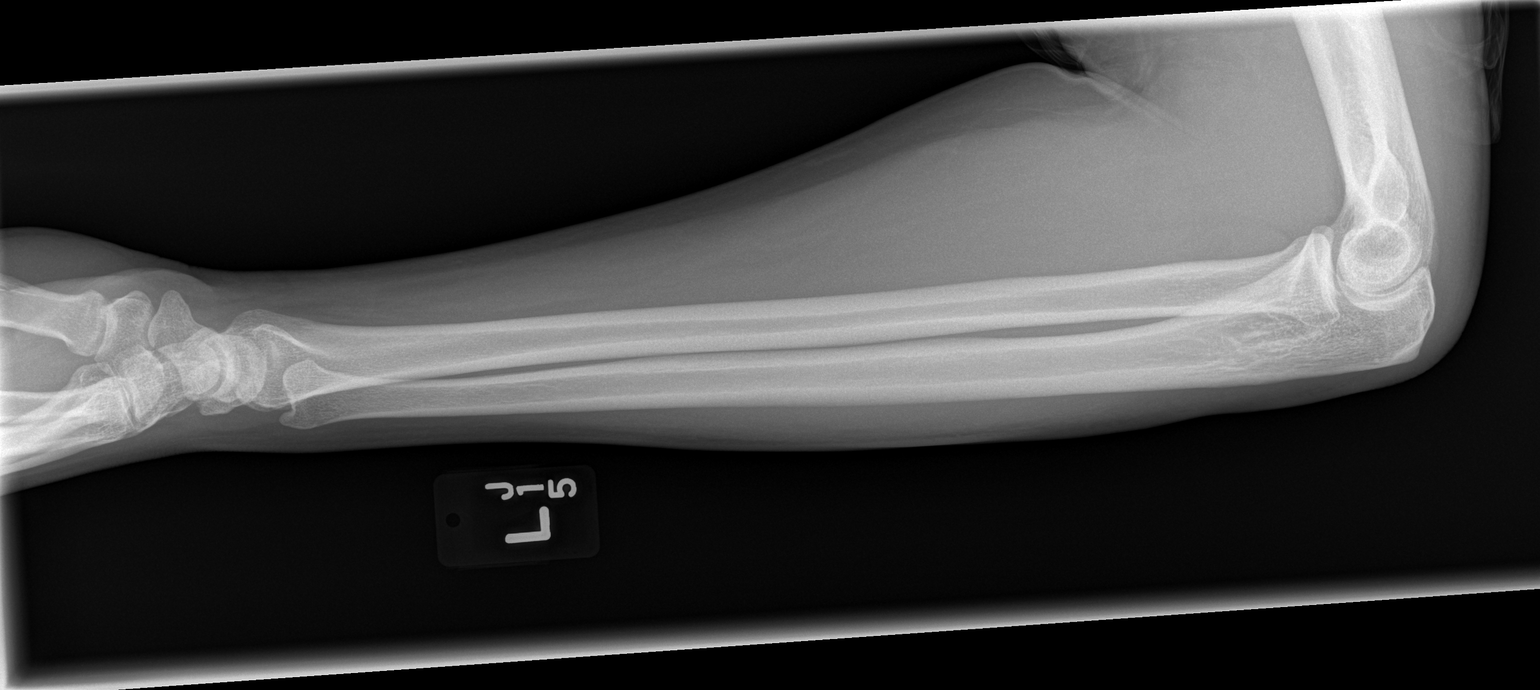

[2 of 2 positions shown; findings below may reference images not displayed]

FINDINGS: There is no evidence of fracture or other focal bone lesions. Soft
tissues are unremarkable.
IMPRESSION: Negative.

## 2017-02-25 ENCOUNTER — Ambulatory Visit: Payer: Medicaid Other | Admitting: Psychology

## 2017-03-05 ENCOUNTER — Ambulatory Visit: Payer: Medicaid Other | Admitting: Psychology

## 2017-03-05 ENCOUNTER — Ambulatory Visit: Payer: Self-pay | Admitting: Psychology

## 2017-03-11 ENCOUNTER — Ambulatory Visit: Payer: Medicaid Other | Admitting: Psychology

## 2017-03-19 ENCOUNTER — Ambulatory Visit: Payer: Self-pay | Admitting: Psychology

## 2017-03-19 ENCOUNTER — Ambulatory Visit (INDEPENDENT_AMBULATORY_CARE_PROVIDER_SITE_OTHER): Payer: Medicaid Other | Admitting: Psychology

## 2017-03-19 DIAGNOSIS — F70 Mild intellectual disabilities: Secondary | ICD-10-CM

## 2017-03-19 DIAGNOSIS — F900 Attention-deficit hyperactivity disorder, predominantly inattentive type: Secondary | ICD-10-CM

## 2017-03-19 DIAGNOSIS — F4323 Adjustment disorder with mixed anxiety and depressed mood: Secondary | ICD-10-CM

## 2017-03-21 DIAGNOSIS — Z0279 Encounter for issue of other medical certificate: Secondary | ICD-10-CM

## 2017-03-25 ENCOUNTER — Ambulatory Visit: Payer: Medicaid Other | Admitting: Psychology

## 2017-04-02 ENCOUNTER — Ambulatory Visit: Payer: Self-pay | Admitting: Psychology

## 2017-04-02 ENCOUNTER — Ambulatory Visit (INDEPENDENT_AMBULATORY_CARE_PROVIDER_SITE_OTHER): Payer: Medicaid Other | Admitting: Psychology

## 2017-04-02 DIAGNOSIS — F70 Mild intellectual disabilities: Secondary | ICD-10-CM

## 2017-04-02 DIAGNOSIS — F900 Attention-deficit hyperactivity disorder, predominantly inattentive type: Secondary | ICD-10-CM

## 2017-04-02 DIAGNOSIS — F4323 Adjustment disorder with mixed anxiety and depressed mood: Secondary | ICD-10-CM

## 2017-04-08 ENCOUNTER — Ambulatory Visit: Payer: Medicaid Other | Admitting: Psychology

## 2017-04-16 ENCOUNTER — Ambulatory Visit: Payer: Self-pay | Admitting: Psychology

## 2017-04-16 ENCOUNTER — Ambulatory Visit (INDEPENDENT_AMBULATORY_CARE_PROVIDER_SITE_OTHER): Payer: Medicaid Other | Admitting: Psychology

## 2017-04-16 DIAGNOSIS — F4323 Adjustment disorder with mixed anxiety and depressed mood: Secondary | ICD-10-CM

## 2017-04-16 DIAGNOSIS — F70 Mild intellectual disabilities: Secondary | ICD-10-CM

## 2017-04-16 DIAGNOSIS — F900 Attention-deficit hyperactivity disorder, predominantly inattentive type: Secondary | ICD-10-CM

## 2017-04-22 ENCOUNTER — Ambulatory Visit: Payer: Medicaid Other | Admitting: Psychology

## 2017-04-30 ENCOUNTER — Ambulatory Visit: Payer: Self-pay | Admitting: Psychology

## 2017-04-30 ENCOUNTER — Ambulatory Visit: Payer: Medicaid Other | Admitting: Psychology

## 2017-05-05 ENCOUNTER — Ambulatory Visit: Payer: BC Managed Care – PPO | Admitting: Medical

## 2017-05-05 ENCOUNTER — Telehealth: Payer: Self-pay | Admitting: Medical

## 2017-05-05 ENCOUNTER — Encounter: Payer: Self-pay | Admitting: Medical

## 2017-05-05 VITALS — BP 114/68 | HR 68 | Wt 267.0 lb

## 2017-05-05 DIAGNOSIS — F988 Other specified behavioral and emotional disorders with onset usually occurring in childhood and adolescence: Secondary | ICD-10-CM | POA: Diagnosis not present

## 2017-05-05 DIAGNOSIS — F419 Anxiety disorder, unspecified: Secondary | ICD-10-CM

## 2017-05-05 MED ORDER — AMPHETAMINE-DEXTROAMPHETAMINE 30 MG PO TABS: 30.0000 mg | ORAL_TABLET | Freq: Two times a day (BID) | ORAL | 0 refills | Status: DC

## 2017-05-05 NOTE — Telephone Encounter (Signed)
Pt states that the Adderall script that he has is for 20 mg and he was told that it was increasing to 30 mg today. He wants to know if he can bring the script back & exchange it for the 30 mg script today.

## 2017-05-05 NOTE — Progress Notes (Signed)
Subjective: Chief Complaint  Patient presents with  . discuss meds    discuss meds and stress    Here for ADD f/u, stress.   In recent years he has done fine on Adderall 20 mg twice daily.  However, this semester had very difficult time.  Doesn't feel like the medication is working as well as it could.   Forgets things often, is in [employer] cooking.  Has to be reminded several times for same issues.  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   Lately having more anxiety, related to his lack of focus or having to repeat thing that other classmates aren't struggling with.   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   Lives with father.   Still acting part time.   Plans to finish the degree in 2019.    No relationship problems, no home problems.   Main stressor was this one class.  No other aggravating or relieving factors. No other complaint.  Past Medical History:  Diagnosis Date  . ADD (attention deficit disorder)   . Anxiety   . Chronic headaches    not frequent as of 07/2015  . Depression   . Gastritis 03/2015   EGD Dr. [name]  . H/O echocardiogram 10/2007   normal stress echo, Dr. Jenkins Rouge  . Learning disability   . Obesity   . Tinea corporis   . Wears glasses    Current Outpatient Medications on File Prior to Visit  Medication Sig Dispense Refill  . griseofulvin (GRIS-PEG) 250 MG tablet Take 1 tablet (250 mg total) by mouth 2 (two) times daily. 60 tablet 1  . ketoconazole (NIZORAL) 2 % shampoo Apply 1 application topically 2 (two) times a week. 120 mL 3  . pantoprazole (PROTONIX) 40 MG tablet Take 1 tablet (40 mg total) by mouth daily. 30 tablet 1   No current facility-administered medications on file prior to visit.    ROS as in subjective   Objective: BP 114/68   Pulse 68   Wt 267 lb (121.1 kg)   SpO2 98%   BMI 34.75 kg/m   Gen: wd, wn, nad Psych: pleasant, good eye contact, answers questions appropriately   Assessment: Encounter  Diagnoses  Name Primary?  . Attention deficit disorder, unspecified hyperactivity presence Yes  . Anxiety      Plan: Discussed his concerns, class work, Armed forces training and education officer, having a disciplined routine.  We discussed medication options.   He has been on Vyvanse in the past as well.   Will increase to Adderall 30mg  BID, but focus on lifestyle management as discussed.   F/u 52mo.     Damare was seen today for discuss meds.  Diagnoses and all orders for this visit:  Attention deficit disorder, unspecified hyperactivity presence  Anxiety  Other orders -     amphetamine-dextroamphetamine (ADDERALL) 30 MG tablet; Take 1 tablet by mouth 2 (two) times daily.

## 2017-05-05 NOTE — Telephone Encounter (Signed)
Left message for pt

## 2017-05-05 NOTE — Telephone Encounter (Signed)
Pt called back & pharmacy gave him 20mg .  Called pharmacist and he checked the Rx and was 30mg  was their error and he will fix.  Called pt back & told him to take medication back to pharmacy and they will replace.

## 2017-05-05 NOTE — Telephone Encounter (Signed)
Have him check date on script.  I am certain I wrote 30mg  tablet today

## 2017-05-06 ENCOUNTER — Ambulatory Visit: Payer: Medicaid Other | Admitting: Psychology

## 2017-05-14 ENCOUNTER — Ambulatory Visit (INDEPENDENT_AMBULATORY_CARE_PROVIDER_SITE_OTHER): Payer: Medicaid Other | Admitting: Psychology

## 2017-05-14 ENCOUNTER — Ambulatory Visit: Payer: Self-pay | Admitting: Psychology

## 2017-05-14 DIAGNOSIS — F4323 Adjustment disorder with mixed anxiety and depressed mood: Secondary | ICD-10-CM

## 2017-05-14 DIAGNOSIS — F900 Attention-deficit hyperactivity disorder, predominantly inattentive type: Secondary | ICD-10-CM

## 2017-05-25 ENCOUNTER — Other Ambulatory Visit: Payer: Self-pay | Admitting: Medical

## 2017-05-28 ENCOUNTER — Ambulatory Visit (INDEPENDENT_AMBULATORY_CARE_PROVIDER_SITE_OTHER): Payer: Medicaid Other | Admitting: Psychology

## 2017-05-28 ENCOUNTER — Ambulatory Visit: Payer: Self-pay | Admitting: Psychology

## 2017-05-28 DIAGNOSIS — F900 Attention-deficit hyperactivity disorder, predominantly inattentive type: Secondary | ICD-10-CM

## 2017-05-28 DIAGNOSIS — F70 Mild intellectual disabilities: Secondary | ICD-10-CM

## 2017-05-28 DIAGNOSIS — F4323 Adjustment disorder with mixed anxiety and depressed mood: Secondary | ICD-10-CM

## 2017-06-03 ENCOUNTER — Other Ambulatory Visit: Payer: Self-pay | Admitting: Medical

## 2017-06-03 ENCOUNTER — Ambulatory Visit: Payer: Medicaid Other | Admitting: Medical

## 2017-06-03 ENCOUNTER — Encounter: Payer: Self-pay | Admitting: Medical

## 2017-06-03 ENCOUNTER — Telehealth: Payer: Self-pay | Admitting: Medical

## 2017-06-03 ENCOUNTER — Ambulatory Visit: Payer: Medicaid Other | Admitting: Psychology

## 2017-06-03 ENCOUNTER — Ambulatory Visit
Admission: RE | Admit: 2017-06-03 | Discharge: 2017-06-03 | Disposition: A | Discharge status: Home / Self Care | Payer: Medicaid Other | Source: Ambulatory Visit | Attending: Medical | Admitting: Medical

## 2017-06-03 VITALS — BP 130/70 | HR 68 | Temp 98.4°F | Wt 264.0 lb

## 2017-06-03 DIAGNOSIS — J029 Acute pharyngitis, unspecified: Secondary | ICD-10-CM

## 2017-06-03 DIAGNOSIS — R0602 Shortness of breath: Secondary | ICD-10-CM | POA: Diagnosis not present

## 2017-06-03 DIAGNOSIS — R05 Cough: Secondary | ICD-10-CM | POA: Diagnosis not present

## 2017-06-03 DIAGNOSIS — R059 Cough, unspecified: Secondary | ICD-10-CM

## 2017-06-03 LAB — CBC WITH DIFFERENTIAL/PLATELET
BASOS ABS: 0 10*3/uL (ref 0.0–0.2)
Basos: 1 %
EOS (ABSOLUTE): 0.1 10*3/uL (ref 0.0–0.4)
Eos: 2 %
Hematocrit: 45.6 % (ref 37.5–51.0)
Hemoglobin: 15.9 g/dL (ref 13.0–17.7)
LYMPHS ABS: 2.5 10*3/uL (ref 0.7–3.1)
Lymphs: 44 %
MCH: 27.8 pg (ref 26.6–33.0)
MCHC: 34.9 g/dL (ref 31.5–35.7)
MCV: 80 fL (ref 79–97)
MONOCYTES: 14 %
MONOS ABS: 0.8 10*3/uL (ref 0.1–0.9)
NEUTROS ABS: 2.3 10*3/uL (ref 1.4–7.0)
Neutrophils: 39 %
PLATELETS: 267 10*3/uL (ref 150–379)
RBC: 5.72 x10E6/uL (ref 4.14–5.80)
RDW: 14.3 % (ref 12.3–15.4)
WBC: 5.7 10*3/uL (ref 3.4–10.8)

## 2017-06-03 NOTE — Telephone Encounter (Signed)
Called Stat labs to [facility] and all results were normal.  I called pt per [name] and advised labs and  X-ray normal, recommend rest, hydrate with plenty of water, Mucinex and call office on Thursday if no better.

## 2017-06-03 NOTE — Progress Notes (Signed)
Subjective:  [name] is a 32 y.o. male who presents for illness.  He reports 3-day history of illness including cough, sore throat, some chills, ear pain.  Denies nausea vomiting or diarrhea, no fever, no body aches.  Cough is deep but nonproductive.  His father has been sick was in the hospital recently on antibiotics with cough and respiratory infection as well.  He is using Mucinex DM.  He is a non-smoker.  No other aggravating or relieving factors.  No other c/o.   The following portions of the patient's history were reviewed and updated as appropriate: allergies, current medications, past family history, past medical history, past social history, past surgical history and problem list.  ROS as in subjective  Past Medical History:  Diagnosis Date  . ADD (attention deficit disorder)   . Anxiety   . Chronic headaches    not frequent as of 07/2015  . Depression   . Gastritis 03/2015   EGD Dr. [name]  . H/O echocardiogram 10/2007   normal stress echo, Dr. Jenkins Rouge  . Learning disability   . Obesity   . Tinea corporis   . Wears glasses    Current Outpatient Medications on File Prior to Visit  Medication Sig Dispense Refill  . amphetamine-dextroamphetamine (ADDERALL) 30 MG tablet Take 1 tablet by mouth 2 (two) times daily. 60 tablet 0  . griseofulvin (GRIS-PEG) 250 MG tablet Take 1 tablet (250 mg total) by mouth 2 (two) times daily. 60 tablet 1  . ketoconazole (NIZORAL) 2 % shampoo Apply 1 application topically 2 (two) times a week. 120 mL 3  . pantoprazole (PROTONIX) 40 MG tablet take 1 tablet by mouth once daily 30 tablet 1   No current facility-administered medications on file prior to visit.    ROS as in subjective   Objective: BP 130/70   Pulse 68   Temp 98.4 F (36.9 C)   Wt 264 lb (119.7 kg)   SpO2 97%   BMI 34.36 kg/m   General appearance: Alert, WD/WN, no distress,somewhat ill appearing                             Skin: warm, no rash, no  diaphoresis                           Head: mild frontal sinus tenderness                            Eyes: conjunctiva normal, corneas clear, PERRLA                            Ears: pearly TMs, external ear canals normal                          Nose: septum midline, turbinates swollen, with erythema and clear discharge             Mouth/throat: MMM, tongue normal, mild pharyngeal erythema                           Neck: supple, no adenopathy, no thyromegaly, nontender                          Heart:  RRR, normal S1, S2, no murmurs                         Lungs: +bronchial breath sounds, decreased right lower field sounds, +scattered rhonchi, faint wheezes, no rales                Extremities: no edema, nontender      Assessment: Encounter Diagnoses  Name Primary?  . Cough Yes  . SOB (shortness of breath)   . Sore throat      Plan:  We discussed his exam findings, symptoms.  We will get a CBC and chest x-ray today.  Need to rule out pneumonia.  Advised good hydration, rest, discussed supportive measures over-the-counter.  We will call with results and other recommendations.  Azriel was seen today for coughing, sore throat ,chills.  Diagnoses and all orders for this visit:  Cough -     DG Chest 2 View; Future -     CBC with Differential/Platelet  SOB (shortness of breath) -     DG Chest 2 View; Future -     CBC with Differential/Platelet  Sore throat -     DG Chest 2 View; Future -     CBC with Differential/Platelet

## 2017-06-04 ENCOUNTER — Encounter: Payer: Self-pay | Admitting: Medical

## 2017-06-04 ENCOUNTER — Ambulatory Visit (INDEPENDENT_AMBULATORY_CARE_PROVIDER_SITE_OTHER): Payer: Medicaid Other | Admitting: Medical

## 2017-06-04 VITALS — BP 122/70 | HR 100 | Temp 98.0°F | Wt 264.4 lb

## 2017-06-04 DIAGNOSIS — J988 Other specified respiratory disorders: Secondary | ICD-10-CM

## 2017-06-04 DIAGNOSIS — R918 Other nonspecific abnormal finding of lung field: Secondary | ICD-10-CM | POA: Diagnosis not present

## 2017-06-04 DIAGNOSIS — R05 Cough: Secondary | ICD-10-CM | POA: Diagnosis not present

## 2017-06-04 DIAGNOSIS — R059 Cough, unspecified: Secondary | ICD-10-CM

## 2017-06-04 MED ORDER — HYDROCODONE-HOMATROPINE 5-1.5 MG/5ML PO SYRP: 5.0000 mL | ORAL_SOLUTION | Freq: Three times a day (TID) | ORAL | 0 refills | Status: DC | PRN

## 2017-06-04 MED ORDER — AZITHROMYCIN 250 MG PO TABS: ORAL_TABLET | ORAL | 0 refills | Status: DC

## 2017-06-04 NOTE — Progress Notes (Signed)
Subjective:  [name] is a 32 y.o. male who presents for illness.  I saw him 2 days ago for the same.  He feels significantly worse, still a lot of cough, productive sputum, fatigue.  He reports 5-day history of illness including cough, sore throat, some chills, ear pain.  Denies nausea vomiting or diarrhea, no fever, no body aches.  Cough is deep but nonproductive.  His father has been sick was in the hospital recently on antibiotics with cough and respiratory infection as well.  He is using Mucinex DM.  He is a non-smoker.  No other aggravating or relieving factors.  No other c/o.   The following portions of the patient's history were reviewed and updated as appropriate: allergies, current medications, past family history, past medical history, past social history, past surgical history and problem list.  ROS as in subjective  Past Medical History:  Diagnosis Date  . ADD (attention deficit disorder)   . Anxiety   . Chronic headaches    not frequent as of 07/2015  . Depression   . Gastritis 03/2015   EGD Dr. [name]  . H/O echocardiogram 10/2007   normal stress echo, Dr. Jenkins Rouge  . Learning disability   . Obesity   . Tinea corporis   . Wears glasses    Current Outpatient Medications on File Prior to Visit  Medication Sig Dispense Refill  . amphetamine-dextroamphetamine (ADDERALL) 30 MG tablet Take 1 tablet by mouth 2 (two) times daily. 60 tablet 0  . griseofulvin (GRIS-PEG) 250 MG tablet Take 1 tablet (250 mg total) by mouth 2 (two) times daily. 60 tablet 1  . ketoconazole (NIZORAL) 2 % shampoo Apply 1 application topically 2 (two) times a week. 120 mL 3  . pantoprazole (PROTONIX) 40 MG tablet take 1 tablet by mouth once daily 30 tablet 1   No current facility-administered medications on file prior to visit.    ROS as in subjective   Objective: BP 122/70   Pulse 100   Temp 98 F (36.7 C)   Wt 264 lb 6.4 oz (119.9 kg)   SpO2 97%   BMI 34.41 kg/m    General appearance: Alert, WD/WN, no distress,somewhat ill appearing                             Skin: warm, no rash, no diaphoresis                           Head: mild frontal sinus tenderness                            Eyes: conjunctiva normal, corneas clear, PERRLA                            Ears: pearly TMs, external ear canals normal                          Nose: septum midline, turbinates swollen, with erythema and clear discharge             Mouth/throat: MMM, tongue normal, mild pharyngeal erythema                           Neck: supple, no adenopathy, no thyromegaly, nontender  Heart: RRR, normal S1, S2, no murmurs                         Lungs: +bronchial breath sounds, decreased right lower field sounds, +scattered rhonchi, faint wheezes, no rales                Extremities: no edema, nontender      Assessment: Encounter Diagnoses  Name Primary?  Marland Kitchen Respiratory tract infection Yes  . Cough   . Abnormal lung field      Plan:  We discussed his exam findings, symptoms.  Begin medications as below.  Advised good hydration, rest, discussed supportive measures over-the-counter.  We will call with results and other recommendations.  [name] was seen today for cold congestion.  Diagnoses and all orders for this visit:  Respiratory tract infection  Cough  Abnormal lung field  Other orders -     HYDROcodone-homatropine (HYCODAN) 5-1.5 MG/5ML syrup; Take 5 mLs by mouth every 8 (eight) hours as needed for cough. -     azithromycin (ZITHROMAX) 250 MG tablet; 2 tablets day 1, then 1 tablet days 2-4

## 2017-06-05 LAB — CBC WITH DIFFERENTIAL/PLATELET

## 2017-06-11 ENCOUNTER — Ambulatory Visit (INDEPENDENT_AMBULATORY_CARE_PROVIDER_SITE_OTHER): Payer: Medicaid Other | Admitting: Psychology

## 2017-06-11 ENCOUNTER — Ambulatory Visit: Payer: Self-pay | Admitting: Psychology

## 2017-06-11 DIAGNOSIS — F4323 Adjustment disorder with mixed anxiety and depressed mood: Secondary | ICD-10-CM

## 2017-06-11 DIAGNOSIS — F900 Attention-deficit hyperactivity disorder, predominantly inattentive type: Secondary | ICD-10-CM

## 2017-06-17 ENCOUNTER — Ambulatory Visit: Payer: Medicaid Other | Admitting: Psychology

## 2017-06-17 ENCOUNTER — Encounter: Payer: Self-pay | Admitting: Medical

## 2017-06-17 ENCOUNTER — Ambulatory Visit: Payer: Medicaid Other | Admitting: Medical

## 2017-06-17 VITALS — BP 126/82 | HR 67 | Temp 98.2°F | Wt 264.8 lb

## 2017-06-17 DIAGNOSIS — J988 Other specified respiratory disorders: Secondary | ICD-10-CM

## 2017-06-17 DIAGNOSIS — R05 Cough: Secondary | ICD-10-CM | POA: Diagnosis not present

## 2017-06-17 DIAGNOSIS — J029 Acute pharyngitis, unspecified: Secondary | ICD-10-CM | POA: Diagnosis not present

## 2017-06-17 DIAGNOSIS — R059 Cough, unspecified: Secondary | ICD-10-CM

## 2017-06-17 MED ORDER — AZITHROMYCIN 500 MG PO TABS: 500.0000 mg | ORAL_TABLET | Freq: Every day | ORAL | 0 refills | Status: DC

## 2017-06-17 MED ORDER — BENZONATATE 200 MG PO CAPS: 200.0000 mg | ORAL_CAPSULE | Freq: Three times a day (TID) | ORAL | 0 refills | Status: DC | PRN

## 2017-06-17 MED ORDER — PREDNISONE 20 MG PO TABS: ORAL_TABLET | ORAL | 0 refills | Status: DC

## 2017-06-17 NOTE — Progress Notes (Signed)
Subjective: Chief Complaint  Patient presents with  . coughing    coughing , feels like some in his throat    Here for recheck.   Thinks he may have bronchitis again, has cough, bad sore throat.  I saw him 06/04/17 for cough and respiratory tract infection.  In recent days, mainly sore throat, cough.   just finished antibiotic.   No night sweats, no chills, no NVD.  Is getting productive mucous.    Using cough drops.   No fever.   No other aggravating or relieving factors. No other complaint.   Past Medical History:  Diagnosis Date  . ADD (attention deficit disorder)   . Anxiety   . Chronic headaches    not frequent as of 07/2015  . Depression   . Gastritis 03/2015   EGD Dr. [name]  . H/O echocardiogram 10/2007   normal stress echo, Dr. Jenkins Rouge  . Learning disability   . Obesity   . Tinea corporis   . Wears glasses    Current Outpatient Medications on File Prior to Visit  Medication Sig Dispense Refill  . amphetamine-dextroamphetamine (ADDERALL) 30 MG tablet Take 1 tablet by mouth 2 (two) times daily. 60 tablet 0  . HYDROcodone-homatropine (HYCODAN) 5-1.5 MG/5ML syrup Take 5 mLs by mouth every 8 (eight) hours as needed for cough. 120 mL 0  . ketoconazole (NIZORAL) 2 % shampoo Apply 1 application topically 2 (two) times a week. 120 mL 3  . pantoprazole (PROTONIX) 40 MG tablet take 1 tablet by mouth once daily 30 tablet 1   No current facility-administered medications on file prior to visit.    ROS as in subjective   Objective: BP 126/82   Pulse 67   Temp 98.2 F (36.8 C)   Wt 264 lb 12.8 oz (120.1 kg)   SpO2 98%   BMI 34.46 kg/m   Wt Readings from Last 3 Encounters:  06/17/17 264 lb 12.8 oz (120.1 kg)  06/04/17 264 lb 6.4 oz (119.9 kg)  06/03/17 264 lb (119.7 kg)   General appearance: Alert, WD/WN, no distress                             Skin: warm, no rash, no diaphoresis                           Head: no sinus tenderness  Eyes: conjunctiva normal, corneas clear, PERRLA                            Ears: pearly TMs, external ear canals normal                          Nose: septum midline, turbinates swollen, with mild erythema and no discharge             Mouth/throat: MMM, tongue normal, mild pharyngeal erythema                           Neck: supple, no adenopathy, no thyromegaly, non tender                          Heart: RRR, normal S1, S2, no murmurs  Lungs: +bronchial breath sounds, no rhonchi, no wheezes, no rales                Extremities: no edema, non tender       Assessment: Encounter Diagnoses  Name Primary?  . Cough Yes  . Sore throat   . Respiratory tract infection      Plan: Discussed symptoms, exam findings, will use medications below, rest , hydrate well.  Advised that cough will linger some, but should be feeling Impromen this week.   Reviewed 06/04/17 visit.   Eduardo was seen today for coughing.  Diagnoses and all orders for this visit:  Cough  Sore throat  Respiratory tract infection  Other orders -     predniSONE (DELTASONE) 20 MG tablet; 3 tablets today, 2 tablets tomorrow, 1 tablet the third day -     benzonatate (TESSALON) 200 MG capsule; Take 1 capsule (200 mg total) by mouth 3 (three) times daily as needed for cough. -     azithromycin (ZITHROMAX) 500 MG tablet; Take 1 tablet (500 mg total) by mouth daily.

## 2017-06-25 ENCOUNTER — Ambulatory Visit (INDEPENDENT_AMBULATORY_CARE_PROVIDER_SITE_OTHER): Payer: Medicaid Other | Admitting: Psychology

## 2017-06-25 ENCOUNTER — Ambulatory Visit: Payer: Self-pay | Admitting: Psychology

## 2017-06-25 DIAGNOSIS — F900 Attention-deficit hyperactivity disorder, predominantly inattentive type: Secondary | ICD-10-CM

## 2017-06-25 DIAGNOSIS — F4323 Adjustment disorder with mixed anxiety and depressed mood: Secondary | ICD-10-CM

## 2017-06-25 DIAGNOSIS — F70 Mild intellectual disabilities: Secondary | ICD-10-CM

## 2017-07-09 ENCOUNTER — Ambulatory Visit (INDEPENDENT_AMBULATORY_CARE_PROVIDER_SITE_OTHER): Payer: Medicaid Other | Admitting: Psychology

## 2017-07-09 DIAGNOSIS — F900 Attention-deficit hyperactivity disorder, predominantly inattentive type: Secondary | ICD-10-CM

## 2017-07-09 DIAGNOSIS — F4323 Adjustment disorder with mixed anxiety and depressed mood: Secondary | ICD-10-CM

## 2017-07-09 DIAGNOSIS — F70 Mild intellectual disabilities: Secondary | ICD-10-CM

## 2017-07-23 ENCOUNTER — Ambulatory Visit: Payer: Medicaid Other | Admitting: Psychology

## 2017-08-06 ENCOUNTER — Ambulatory Visit (INDEPENDENT_AMBULATORY_CARE_PROVIDER_SITE_OTHER): Payer: Medicaid Other | Admitting: Psychology

## 2017-08-06 DIAGNOSIS — F4323 Adjustment disorder with mixed anxiety and depressed mood: Secondary | ICD-10-CM

## 2017-08-06 DIAGNOSIS — F70 Mild intellectual disabilities: Secondary | ICD-10-CM

## 2017-08-06 DIAGNOSIS — F900 Attention-deficit hyperactivity disorder, predominantly inattentive type: Secondary | ICD-10-CM

## 2017-08-20 ENCOUNTER — Ambulatory Visit (INDEPENDENT_AMBULATORY_CARE_PROVIDER_SITE_OTHER): Payer: Medicaid Other | Admitting: Psychology

## 2017-08-20 DIAGNOSIS — F900 Attention-deficit hyperactivity disorder, predominantly inattentive type: Secondary | ICD-10-CM

## 2017-08-20 DIAGNOSIS — F70 Mild intellectual disabilities: Secondary | ICD-10-CM

## 2017-08-20 DIAGNOSIS — F4323 Adjustment disorder with mixed anxiety and depressed mood: Secondary | ICD-10-CM

## 2017-09-03 ENCOUNTER — Ambulatory Visit (INDEPENDENT_AMBULATORY_CARE_PROVIDER_SITE_OTHER): Payer: Medicaid Other | Admitting: Psychology

## 2017-09-03 DIAGNOSIS — F4323 Adjustment disorder with mixed anxiety and depressed mood: Secondary | ICD-10-CM

## 2017-09-03 DIAGNOSIS — F70 Mild intellectual disabilities: Secondary | ICD-10-CM

## 2017-09-03 DIAGNOSIS — F900 Attention-deficit hyperactivity disorder, predominantly inattentive type: Secondary | ICD-10-CM

## 2017-09-08 ENCOUNTER — Other Ambulatory Visit: Payer: Self-pay

## 2017-09-08 NOTE — Telephone Encounter (Signed)
Patient called to request refill on his Adderall. Please advise

## 2017-09-09 MED ORDER — AMPHETAMINE-DEXTROAMPHETAMINE 30 MG PO TABS: 30.0000 mg | ORAL_TABLET | Freq: Two times a day (BID) | ORAL | 0 refills | Status: DC

## 2017-09-17 ENCOUNTER — Ambulatory Visit: Payer: Medicaid Other | Admitting: Psychology

## 2017-10-01 ENCOUNTER — Ambulatory Visit (INDEPENDENT_AMBULATORY_CARE_PROVIDER_SITE_OTHER): Payer: Medicaid Other | Admitting: Psychology

## 2017-10-01 DIAGNOSIS — F4323 Adjustment disorder with mixed anxiety and depressed mood: Secondary | ICD-10-CM

## 2017-10-01 DIAGNOSIS — F70 Mild intellectual disabilities: Secondary | ICD-10-CM

## 2017-10-01 DIAGNOSIS — F900 Attention-deficit hyperactivity disorder, predominantly inattentive type: Secondary | ICD-10-CM

## 2017-10-15 ENCOUNTER — Ambulatory Visit (INDEPENDENT_AMBULATORY_CARE_PROVIDER_SITE_OTHER): Payer: Medicaid Other | Admitting: Psychology

## 2017-10-15 DIAGNOSIS — F900 Attention-deficit hyperactivity disorder, predominantly inattentive type: Secondary | ICD-10-CM

## 2017-10-15 DIAGNOSIS — F70 Mild intellectual disabilities: Secondary | ICD-10-CM

## 2017-10-15 DIAGNOSIS — F4323 Adjustment disorder with mixed anxiety and depressed mood: Secondary | ICD-10-CM

## 2017-10-22 ENCOUNTER — Ambulatory Visit: Payer: Medicaid Other | Admitting: Medical

## 2017-10-22 ENCOUNTER — Other Ambulatory Visit: Payer: Self-pay | Admitting: Medical

## 2017-10-22 ENCOUNTER — Encounter: Payer: Self-pay | Admitting: Medical

## 2017-10-22 VITALS — BP 128/84 | HR 80 | Temp 98.1°F | Resp 20 | Ht 73.0 in | Wt 279.6 lb

## 2017-10-22 DIAGNOSIS — L989 Disorder of the skin and subcutaneous tissue, unspecified: Secondary | ICD-10-CM

## 2017-10-22 DIAGNOSIS — D229 Melanocytic nevi, unspecified: Secondary | ICD-10-CM

## 2017-10-22 NOTE — Progress Notes (Signed)
Subjective: Chief Complaint  Patient presents with  . Nevus    mole in the middle of his back that is bothering him, painful-not bleeding. Been there for about a month. Been hurting for about 3 days.    Here for skin lesions.  He notes just noticing a lesion on his right upper back and is hanging off, getting in the way of closed, it hurts at times, irritated.  No bleeding.  He is never seen it before think this only been over the last week or so.  Past Medical History:  Diagnosis Date  . ADD (attention deficit disorder)   . Anxiety   . Chronic headaches    not frequent as of 07/2015  . Depression   . Gastritis 03/2015   EGD Dr. [name]  . H/O echocardiogram 10/2007   normal stress echo, Dr. Jenkins Rouge  . Learning disability   . Obesity   . Tinea corporis   . Wears glasses    Current Outpatient Medications on File Prior to Visit  Medication Sig Dispense Refill  . amphetamine-dextroamphetamine (ADDERALL) 30 MG tablet Take 1 tablet by mouth 2 (two) times daily. 60 tablet 0  . ketoconazole (NIZORAL) 2 % shampoo Apply 1 application topically 2 (two) times a week. 120 mL 3  . pantoprazole (PROTONIX) 40 MG tablet take 1 tablet by mouth once daily 30 tablet 1   No current facility-administered medications on file prior to visit.    ROS as in subjective    Objective: BP 128/84   Pulse 80   Temp 98.1 F (36.7 C) (Oral)   Resp 20   Ht 6\' 1"  (1.854 m)   Wt 279 lb 9.6 oz (126.8 kg)   BMI 36.89 kg/m   Gen: wd, wn, nad Skin: Right upper back just adjacent to the neck with an irregular flat brown/black macule, 5 mm x 4 mm roughly Right mid back with pedunculated fleshy spongy brown, 1.5 cm round lesion with irritated base   Assessment: Encounter Diagnoses  Name Primary?  . Atypical mole Yes  . Changing skin lesion      Plan: We discussed the skin lesion he came in for but also there was an irregular macule noted on his back.  I recommend a biopsy of the macule and  he wants to have the pedunculated lesion removed due to irritation and pain.  Discussed skin findings.   Patient desires excision.  Discussed risks and benefits of excisional biopsy.  Cleaned and prepped both the upper back and mid back lesions in usual sterile fashion, used 2% lidocaine with epinephrine for local anesthesia.   Used core biopsy to remove a 4 mm section of the flat macule on the upper back.  I placed 1, five-point 0 black nylon suture to achieve hemostasis.  Use capital to remove the pedunculated lesion.  Achieve hemostasis with direct pressure and silver nitrate.  Cleaned the wound area. Covered with sterile bandage.   discussed wound care.  Pt tolerated procedure well.  <3 cc EBL   await biopsy results.

## 2017-10-27 ENCOUNTER — Ambulatory Visit (INDEPENDENT_AMBULATORY_CARE_PROVIDER_SITE_OTHER): Payer: Medicaid Other | Admitting: Medical

## 2017-10-27 ENCOUNTER — Encounter: Payer: Self-pay | Admitting: Medical

## 2017-10-27 VITALS — BP 126/88 | HR 90 | Temp 98.2°F | Ht 72.0 in | Wt 274.6 lb

## 2017-10-27 DIAGNOSIS — Z4802 Encounter for removal of sutures: Secondary | ICD-10-CM

## 2017-10-27 NOTE — Progress Notes (Signed)
Subjective: Chief Complaint  Patient presents with  . Suture / Staple Removal    back    Here for suture removal.  I saw him last week for biopsy of skin of right upper back.  He denies any concerns no symptoms no redness doing fine.    Objective: Right upper back with single interrupted nylon suture, skin with normal-appearing wound healing    Assessment: Encounter Diagnosis  Name Primary?  . Visit for suture removal Yes     Plan: Cleaned and prepped left upper back, reviewed one single simple interrupted nylon suture.  Covered with Band-Aid.  Discussed wound care and reviewed his recent pathology results which were benign.

## 2017-10-29 ENCOUNTER — Ambulatory Visit (INDEPENDENT_AMBULATORY_CARE_PROVIDER_SITE_OTHER): Payer: Medicaid Other | Admitting: Psychology

## 2017-10-29 DIAGNOSIS — F4323 Adjustment disorder with mixed anxiety and depressed mood: Secondary | ICD-10-CM | POA: Diagnosis not present

## 2017-10-29 DIAGNOSIS — F900 Attention-deficit hyperactivity disorder, predominantly inattentive type: Secondary | ICD-10-CM | POA: Diagnosis not present

## 2017-10-29 DIAGNOSIS — F70 Mild intellectual disabilities: Secondary | ICD-10-CM | POA: Diagnosis not present

## 2017-10-30 ENCOUNTER — Encounter: Payer: Self-pay | Admitting: Medical

## 2017-11-12 ENCOUNTER — Ambulatory Visit (INDEPENDENT_AMBULATORY_CARE_PROVIDER_SITE_OTHER): Payer: Medicaid Other | Admitting: Psychology

## 2017-11-12 DIAGNOSIS — F4323 Adjustment disorder with mixed anxiety and depressed mood: Secondary | ICD-10-CM

## 2017-11-12 DIAGNOSIS — F900 Attention-deficit hyperactivity disorder, predominantly inattentive type: Secondary | ICD-10-CM | POA: Diagnosis not present

## 2017-11-12 DIAGNOSIS — F70 Mild intellectual disabilities: Secondary | ICD-10-CM

## 2017-11-26 ENCOUNTER — Ambulatory Visit (INDEPENDENT_AMBULATORY_CARE_PROVIDER_SITE_OTHER): Payer: Medicaid Other | Admitting: Psychology

## 2017-11-26 DIAGNOSIS — F4323 Adjustment disorder with mixed anxiety and depressed mood: Secondary | ICD-10-CM

## 2017-12-10 ENCOUNTER — Ambulatory Visit (INDEPENDENT_AMBULATORY_CARE_PROVIDER_SITE_OTHER): Payer: Medicaid Other | Admitting: Psychology

## 2017-12-10 DIAGNOSIS — F900 Attention-deficit hyperactivity disorder, predominantly inattentive type: Secondary | ICD-10-CM

## 2017-12-10 DIAGNOSIS — F70 Mild intellectual disabilities: Secondary | ICD-10-CM

## 2017-12-10 DIAGNOSIS — F4323 Adjustment disorder with mixed anxiety and depressed mood: Secondary | ICD-10-CM

## 2017-12-29 ENCOUNTER — Ambulatory Visit: Payer: Medicaid Other | Admitting: Medical

## 2017-12-29 ENCOUNTER — Encounter: Payer: Self-pay | Admitting: Medical

## 2017-12-29 VITALS — BP 120/84 | HR 67 | Temp 98.5°F | Ht 72.0 in | Wt 273.8 lb

## 2017-12-29 DIAGNOSIS — F419 Anxiety disorder, unspecified: Secondary | ICD-10-CM

## 2017-12-29 DIAGNOSIS — R440 Auditory hallucinations: Secondary | ICD-10-CM | POA: Insufficient documentation

## 2017-12-29 DIAGNOSIS — F988 Other specified behavioral and emotional disorders with onset usually occurring in childhood and adolescence: Secondary | ICD-10-CM | POA: Diagnosis not present

## 2017-12-29 DIAGNOSIS — F329 Major depressive disorder, single episode, unspecified: Secondary | ICD-10-CM | POA: Diagnosis not present

## 2017-12-29 DIAGNOSIS — R4589 Other symptoms and signs involving emotional state: Secondary | ICD-10-CM | POA: Insufficient documentation

## 2017-12-29 DIAGNOSIS — R4586 Emotional lability: Secondary | ICD-10-CM

## 2017-12-29 MED ORDER — RISPERIDONE 1 MG PO TABS: 0.5000 mg | ORAL_TABLET | Freq: Two times a day (BID) | ORAL | 0 refills | Status: DC

## 2017-12-29 MED ORDER — AMPHETAMINE-DEXTROAMPHETAMINE 30 MG PO TABS: 30.0000 mg | ORAL_TABLET | Freq: Two times a day (BID) | ORAL | 0 refills | Status: DC

## 2017-12-29 NOTE — Patient Instructions (Signed)
Recommendations:  Continue your ADD medication  Begin trial of Risperidone 1mg .  Begin 1/2 tablet twice daily for 2 weeks, then increase to 1 tablet twice daily  I want you to schedule and appointment with a psychiatrist ASAP  You should call some on the list below to get appointment.  Beverly Sessions will be the fastest, but you can also call around to others on the list below  If you feel like harming yourself or harming someone else, report to [facility] emergency department immediately  Plan to see me in 2 weeks for follow up  Continue seeing your therapist but consider going every 2 weeks   RESOURCES in [place], [place]  If you are experiencing a mental health crisis or an emergency, please call 911 or go to the nearest emergency department.  [facility]   [phone] [facility]  [phone] [facility]   [phone]  Suicide Hotline 1-800-Suicide 340-031-0048)  National Suicide Prevention Lifeline 517-272-3951  912-213-3054)  Domestic Violence, Rape/Crisis - [place] 239 108 5726  The [employer] Violence Hotline 1-800-799-SAFE 213-104-8762)  To report Child or Elder Abuse, please call: [facility] Police Department  [phone] [facility] Department  [phone]  [place] [phone]  Teen Crisis line [phone] or [phone]     Psychiatry and Counseling services  [facility] [place], [place], [place] [address] [phone]  Lina Sayre, therapist Dr. Lynder Parents, psychiatrist Dr. Milana Huntsman, child psychiatrist   Dr. Launa Flight [address], [place], [place] [address] [phone]   Dr. Chucky May, psychiatry [address] Carolynne Edouard [place], [place] [address] [phone]   [facility], [place] [address] [phone]   [facility] [place],  [place],  [address] [phone]

## 2017-12-29 NOTE — Progress Notes (Signed)
Subjective: Chief Complaint  Patient presents with  . Headache    x 2-3 weeks   . Anxiety    always stressed and unable to relax    Here for headaches and anxiety.   Unable to relax, feels like nerves are "shot."   Has felt this in the past from time to time.   Hard to relaxes.   Gets nervous and vomits sometimes getting worked up before [employer].   In theater, actor.   Gets bad headaches at times.  No new stressors in life currently.   Feels this way nonstop lately, whether at home or practice.     Lives with father.    Currently uses rest, sleep to help.     Seeing therapist Cyndia Bent in [place] in [place].   Saw her few weeks ago.   She has given him some things to try to help.   Has been seeing her a year.  Still taking his ADD medication.   Still hears some voices at times.   Hears a male voice sometimes, male voice sometimes.  Has heard voices for years.   Has had some anger at people, but never thought of harming them.  Feels moody.   Feels depressed sometimes.   Has thought of suicide in the past, not lately, no intent, no means of suicide thought out, no guns in home.   Eyes feels jumpy, eyebrows seems jumpy.  Last visit with psychiatrist 3+ years ago off [place].    He attributes the headaches to his stress and anxiety.    Goes back to school next week, last semester of this program.   In [employer] program.  Past Medical History:  Diagnosis Date  . ADD (attention deficit disorder)   . Anxiety   . Chronic headaches    not frequent as of 07/2015  . Depression   . Gastritis 03/2015   EGD Dr. [name]  . H/O echocardiogram 10/2007   normal stress echo, Dr. Jenkins Rouge  . Learning disability   . Obesity   . Tinea corporis   . Wears glasses    Current Outpatient Medications on File Prior to Visit  Medication Sig Dispense Refill  . ketoconazole (NIZORAL) 2 % shampoo Apply 1 application topically 2 (two) times a week. 120 mL 3  . pantoprazole (PROTONIX)  40 MG tablet take 1 tablet by mouth once daily 30 tablet 1   No current facility-administered medications on file prior to visit.    ROS as in subjective   Objective: BP 120/84   Pulse 67   Temp 98.5 F (36.9 C) (Oral)   Ht 6' (1.829 m)   Wt 273 lb 12.8 oz (124.2 kg)   SpO2 96%   BMI 37.13 kg/m   General appearance: alert, no distress, WD/WN,  HEENT: normocephalic, sclerae anicteric, PERRLA, EOMi, nares patent, no discharge or erythema, pharynx normal Oral cavity: MMM, no lesions Neck: supple, no lymphadenopathy, no thyromegaly, no masses Heart: RRR, normal S1, S2, no murmurs Lungs: CTA bilaterally, no wheezes, rhonchi, or rales Extremities: no edema, no cyanosis, no clubbing Pulses: 2+ symmetric, upper and lower extremities, normal cap refill Neurological: alert, oriented x 3, CN2-12 intact, nonfocal exam Psychiatric: normal affect, behavior normal, pleasant , good eye contact    Assessment: Encounter Diagnoses  Name Primary?  . Attention deficit disorder, unspecified hyperactivity presence Yes  . Hearing voices   . Anxiety   . Depressed mood   . Mood swings  Plan: Discussed symptoms, concerns.    PHQ9 score of 16 Mood disorder questionnaire today + screen  No urgent risk for SI/HI.  Discussed concerns, need for other follow up with therapist and establish with psychiatrist.   Recommendations:  Continue your ADD medication  Begin trial of Risperidone 1mg .  Begin 1/2 tablet twice daily for 2 weeks, then increase to 1 tablet twice daily  I want you to schedule and appointment with a psychiatrist ASAP  You should call some on the list below to get appointment.  Beverly Sessions will be the fastest, but you can also call around to others on the list below  If you feel like harming yourself or harming someone else, report to [facility] emergency department immediately  Plan to see me in 2 weeks for follow up  Continue seeing your therapist but consider going  every 2 weeks    [name] was seen today for headache and anxiety.  Diagnoses and all orders for this visit:  Attention deficit disorder, unspecified hyperactivity presence  Hearing voices  Anxiety  Depressed mood  Mood swings  Other orders -     risperiDONE (RISPERDAL) 1 MG tablet; Take 0.5 tablets (0.5 mg total) by mouth 2 (two) times daily. Increase to 1mg  BID after 2 weeks -     amphetamine-dextroamphetamine (ADDERALL) 30 MG tablet; Take 1 tablet by mouth 2 (two) times daily.

## 2018-01-07 ENCOUNTER — Ambulatory Visit: Payer: Medicaid Other | Admitting: Psychology

## 2018-01-09 ENCOUNTER — Ambulatory Visit: Payer: Medicaid Other | Admitting: Medical

## 2018-01-09 VITALS — BP 112/70 | HR 85 | Temp 98.6°F | Resp 16 | Ht 73.0 in | Wt 269.2 lb

## 2018-01-09 DIAGNOSIS — J988 Other specified respiratory disorders: Secondary | ICD-10-CM

## 2018-01-09 DIAGNOSIS — H938X3 Other specified disorders of ear, bilateral: Secondary | ICD-10-CM

## 2018-01-09 MED ORDER — AMOXICILLIN 500 MG PO TABS: ORAL_TABLET | ORAL | 0 refills | Status: DC

## 2018-01-09 NOTE — Progress Notes (Signed)
Subjective:  [name] is a 32 y.o. male who presents for respiratory illness.   He reports 4 days of illness.    He notes ear pain, congestion, nose stopped up, headache, sneezing.  Father sick with the same.  Feels feverish, chills.   Has some cough.  No NVD.    Using theraflu for symptoms. reports sick contacts.  No other aggravating or relieving factors.  No other c/o.  Past Medical History:  Diagnosis Date  . ADD (attention deficit disorder)   . Anxiety   . Chronic headaches    not frequent as of 07/2015  . Depression   . Gastritis 03/2015   EGD Dr. [name]  . H/O echocardiogram 10/2007   normal stress echo, Dr. Jenkins Rouge  . Learning disability   . Obesity   . Tinea corporis   . Wears glasses     ROS as in subjective   Objective: BP 112/70   Pulse 85   Temp 98.6 F (37 C) (Oral)   Resp 16   Ht 6\' 1"  (1.854 m)   Wt 269 lb 3.2 oz (122.1 kg)   SpO2 96%   BMI 35.52 kg/m   General appearance: Alert, WD/WN, no distress, mildly ill appearing                             Skin: warm, no rash                           Head: + sinus tenderness                            Eyes: conjunctiva normal, corneas clear, PERRLA                            Ears: full TMs with slight erythema,  external ear canals normal                          Nose: septum midline, turbinates swollen, with erythema and clear discharge             Mouth/throat: MMM, tongue normal, mild pharyngeal erythema                           Neck: supple, no adenopathy, no thyromegaly, non tender                            Lungs: CTA bilaterally, no wheezes, rales, or rhonchi      Assessment  Encounter Diagnoses  Name Primary?  Marland Kitchen Respiratory tract infection Yes  . Sensation of fullness in both ears       Plan: discuses symptom and exam findings.  I recommended continuing supportive care the next 48 hours, but gave watch and wait script for amoxicillin if symptoms worsen as  discussed.  Specific home care recommendations today include:  Only take over-the-counter (OTC) or prescription medicines for pain, discomfort, or fever as directed by your caregiver.    Decongestant: You may use OTC Guaifenesin (Mucinex plain) for congestion.  You may use Pseudoephedrine (Sudafed) only if you don't have blood pressure problems or a diagnosis of hypertension.  Cough suppression: If you have cough from drainage, you  may use over-the-counter Dextromethorphan (Delsym) as directed on the label  Sore throat remedies:  You may use salt water gargles, warm fluids such as coffee or hot tea, or honey/tea/lemon mixture to sooth sore throat pain.  You may use OTC sore throat remedies such as Cepacol lozenges or Chloraseptic spray for sore throat pain.  Runny nose and sneezing remedies: You may use OTC antihistamine such as Zyrtec or Benadryl, but caution as these can cause drowsiness.    Pain/fever relief: You may use over-the-counter Tylenol for pain or fever  Drink extra fluids. Fluids help thin the mucus so your sinuses can drain more easily.   Applying either moist heat or ice packs to the sinus areas may help relieve discomfort.  Use saline nasal sprays to help moisten your sinuses. The sprays can be found at your local drugstore.   Patient was advised to call or return if worse or not improving in the next few days.    Patient voiced understanding of diagnosis, recommendations, and treatment plan.  After visit summary given.

## 2018-01-21 ENCOUNTER — Ambulatory Visit (INDEPENDENT_AMBULATORY_CARE_PROVIDER_SITE_OTHER): Payer: Medicaid Other | Admitting: Psychology

## 2018-01-21 DIAGNOSIS — F70 Mild intellectual disabilities: Secondary | ICD-10-CM

## 2018-01-21 DIAGNOSIS — F900 Attention-deficit hyperactivity disorder, predominantly inattentive type: Secondary | ICD-10-CM

## 2018-01-21 DIAGNOSIS — F4323 Adjustment disorder with mixed anxiety and depressed mood: Secondary | ICD-10-CM

## 2018-02-04 ENCOUNTER — Ambulatory Visit: Payer: Medicaid Other | Admitting: Psychology

## 2018-02-05 ENCOUNTER — Ambulatory Visit (INDEPENDENT_AMBULATORY_CARE_PROVIDER_SITE_OTHER): Payer: Medicaid Other | Admitting: Psychology

## 2018-02-05 DIAGNOSIS — F4323 Adjustment disorder with mixed anxiety and depressed mood: Secondary | ICD-10-CM

## 2018-02-05 DIAGNOSIS — F900 Attention-deficit hyperactivity disorder, predominantly inattentive type: Secondary | ICD-10-CM

## 2018-02-18 ENCOUNTER — Ambulatory Visit: Payer: Medicaid Other | Admitting: Psychology

## 2018-02-19 ENCOUNTER — Ambulatory Visit (INDEPENDENT_AMBULATORY_CARE_PROVIDER_SITE_OTHER): Payer: Medicaid Other | Admitting: Psychology

## 2018-02-19 DIAGNOSIS — F70 Mild intellectual disabilities: Secondary | ICD-10-CM

## 2018-02-19 DIAGNOSIS — F900 Attention-deficit hyperactivity disorder, predominantly inattentive type: Secondary | ICD-10-CM

## 2018-02-19 DIAGNOSIS — F4323 Adjustment disorder with mixed anxiety and depressed mood: Secondary | ICD-10-CM | POA: Diagnosis not present

## 2018-02-23 ENCOUNTER — Other Ambulatory Visit: Payer: Self-pay | Admitting: Medical

## 2018-03-05 ENCOUNTER — Ambulatory Visit: Payer: Medicaid Other | Admitting: Psychology

## 2018-03-19 ENCOUNTER — Ambulatory Visit (INDEPENDENT_AMBULATORY_CARE_PROVIDER_SITE_OTHER): Payer: Medicaid Other | Admitting: Psychology

## 2018-03-19 DIAGNOSIS — F4323 Adjustment disorder with mixed anxiety and depressed mood: Secondary | ICD-10-CM | POA: Diagnosis not present

## 2018-03-19 DIAGNOSIS — F900 Attention-deficit hyperactivity disorder, predominantly inattentive type: Secondary | ICD-10-CM

## 2018-03-19 DIAGNOSIS — F70 Mild intellectual disabilities: Secondary | ICD-10-CM

## 2018-04-02 ENCOUNTER — Ambulatory Visit (INDEPENDENT_AMBULATORY_CARE_PROVIDER_SITE_OTHER): Payer: Medicaid Other | Admitting: Psychology

## 2018-04-02 DIAGNOSIS — F4323 Adjustment disorder with mixed anxiety and depressed mood: Secondary | ICD-10-CM

## 2018-04-02 DIAGNOSIS — F70 Mild intellectual disabilities: Secondary | ICD-10-CM | POA: Diagnosis not present

## 2018-04-02 DIAGNOSIS — F900 Attention-deficit hyperactivity disorder, predominantly inattentive type: Secondary | ICD-10-CM

## 2018-05-14 ENCOUNTER — Ambulatory Visit (INDEPENDENT_AMBULATORY_CARE_PROVIDER_SITE_OTHER): Payer: Medicaid Other | Admitting: Psychology

## 2018-05-14 DIAGNOSIS — F70 Mild intellectual disabilities: Secondary | ICD-10-CM

## 2018-05-14 DIAGNOSIS — F4323 Adjustment disorder with mixed anxiety and depressed mood: Secondary | ICD-10-CM

## 2018-05-14 DIAGNOSIS — F902 Attention-deficit hyperactivity disorder, combined type: Secondary | ICD-10-CM

## 2018-05-28 ENCOUNTER — Ambulatory Visit (INDEPENDENT_AMBULATORY_CARE_PROVIDER_SITE_OTHER): Payer: Medicaid Other | Admitting: Psychology

## 2018-05-28 DIAGNOSIS — F70 Mild intellectual disabilities: Secondary | ICD-10-CM | POA: Diagnosis not present

## 2018-05-28 DIAGNOSIS — F900 Attention-deficit hyperactivity disorder, predominantly inattentive type: Secondary | ICD-10-CM

## 2018-05-28 DIAGNOSIS — F4323 Adjustment disorder with mixed anxiety and depressed mood: Secondary | ICD-10-CM

## 2018-06-11 ENCOUNTER — Ambulatory Visit (INDEPENDENT_AMBULATORY_CARE_PROVIDER_SITE_OTHER): Payer: Medicaid Other | Admitting: Psychology

## 2018-06-11 DIAGNOSIS — F902 Attention-deficit hyperactivity disorder, combined type: Secondary | ICD-10-CM | POA: Diagnosis not present

## 2018-06-11 DIAGNOSIS — F4323 Adjustment disorder with mixed anxiety and depressed mood: Secondary | ICD-10-CM | POA: Diagnosis not present

## 2018-06-11 DIAGNOSIS — F70 Mild intellectual disabilities: Secondary | ICD-10-CM | POA: Diagnosis not present

## 2018-06-25 ENCOUNTER — Ambulatory Visit (INDEPENDENT_AMBULATORY_CARE_PROVIDER_SITE_OTHER): Payer: Medicaid Other | Admitting: Psychology

## 2018-06-25 DIAGNOSIS — F4323 Adjustment disorder with mixed anxiety and depressed mood: Secondary | ICD-10-CM

## 2018-06-25 DIAGNOSIS — F902 Attention-deficit hyperactivity disorder, combined type: Secondary | ICD-10-CM

## 2018-06-25 DIAGNOSIS — F70 Mild intellectual disabilities: Secondary | ICD-10-CM | POA: Diagnosis not present

## 2018-07-09 ENCOUNTER — Ambulatory Visit (INDEPENDENT_AMBULATORY_CARE_PROVIDER_SITE_OTHER): Payer: Medicaid Other | Admitting: Psychology

## 2018-07-09 DIAGNOSIS — F4323 Adjustment disorder with mixed anxiety and depressed mood: Secondary | ICD-10-CM

## 2018-07-09 DIAGNOSIS — F902 Attention-deficit hyperactivity disorder, combined type: Secondary | ICD-10-CM | POA: Diagnosis not present

## 2018-07-09 DIAGNOSIS — F70 Mild intellectual disabilities: Secondary | ICD-10-CM

## 2018-07-23 ENCOUNTER — Ambulatory Visit (INDEPENDENT_AMBULATORY_CARE_PROVIDER_SITE_OTHER): Payer: Medicaid Other | Admitting: Psychology

## 2018-07-23 DIAGNOSIS — F4323 Adjustment disorder with mixed anxiety and depressed mood: Secondary | ICD-10-CM | POA: Diagnosis not present

## 2018-07-23 DIAGNOSIS — F70 Mild intellectual disabilities: Secondary | ICD-10-CM | POA: Diagnosis not present

## 2018-07-23 DIAGNOSIS — F902 Attention-deficit hyperactivity disorder, combined type: Secondary | ICD-10-CM | POA: Diagnosis not present

## 2018-08-04 ENCOUNTER — Ambulatory Visit: Payer: Medicaid Other | Admitting: Psychology

## 2018-08-06 ENCOUNTER — Ambulatory Visit (INDEPENDENT_AMBULATORY_CARE_PROVIDER_SITE_OTHER): Payer: Medicaid Other | Admitting: Psychology

## 2018-08-06 ENCOUNTER — Other Ambulatory Visit: Payer: Self-pay

## 2018-08-06 DIAGNOSIS — F4323 Adjustment disorder with mixed anxiety and depressed mood: Secondary | ICD-10-CM | POA: Diagnosis not present

## 2018-08-06 DIAGNOSIS — F902 Attention-deficit hyperactivity disorder, combined type: Secondary | ICD-10-CM

## 2018-08-06 DIAGNOSIS — F70 Mild intellectual disabilities: Secondary | ICD-10-CM | POA: Diagnosis not present

## 2018-08-20 ENCOUNTER — Ambulatory Visit (INDEPENDENT_AMBULATORY_CARE_PROVIDER_SITE_OTHER): Payer: Medicaid Other | Admitting: Psychology

## 2018-08-20 DIAGNOSIS — F902 Attention-deficit hyperactivity disorder, combined type: Secondary | ICD-10-CM | POA: Diagnosis not present

## 2018-08-20 DIAGNOSIS — F4323 Adjustment disorder with mixed anxiety and depressed mood: Secondary | ICD-10-CM | POA: Diagnosis not present

## 2018-08-20 DIAGNOSIS — F70 Mild intellectual disabilities: Secondary | ICD-10-CM | POA: Diagnosis not present

## 2018-08-26 ENCOUNTER — Telehealth: Payer: Self-pay | Admitting: Medical

## 2018-08-26 NOTE — Telephone Encounter (Signed)
   Needs refill on ADHD meds, he does not know the name of meds   [address]

## 2018-08-27 ENCOUNTER — Other Ambulatory Visit: Payer: Self-pay | Admitting: Medical

## 2018-08-27 MED ORDER — AMPHETAMINE-DEXTROAMPHETAMINE 30 MG PO TABS: 30.0000 mg | ORAL_TABLET | Freq: Two times a day (BID) | ORAL | 0 refills | Status: DC

## 2018-08-27 NOTE — Telephone Encounter (Signed)
Med sent, schedule virtual med check

## 2018-08-31 NOTE — Telephone Encounter (Signed)
Left message for pt to call back to schedule virtual med check within 30 days

## 2018-09-03 ENCOUNTER — Ambulatory Visit: Payer: Medicaid Other | Admitting: Medical

## 2018-09-03 ENCOUNTER — Ambulatory Visit (INDEPENDENT_AMBULATORY_CARE_PROVIDER_SITE_OTHER): Payer: Medicaid Other | Admitting: Psychology

## 2018-09-03 ENCOUNTER — Encounter: Payer: Self-pay | Admitting: Medical

## 2018-09-03 ENCOUNTER — Other Ambulatory Visit: Payer: Self-pay

## 2018-09-03 VITALS — Ht 73.0 in | Wt 260.0 lb

## 2018-09-03 DIAGNOSIS — F419 Anxiety disorder, unspecified: Secondary | ICD-10-CM | POA: Diagnosis not present

## 2018-09-03 DIAGNOSIS — E669 Obesity, unspecified: Secondary | ICD-10-CM | POA: Diagnosis not present

## 2018-09-03 DIAGNOSIS — F4323 Adjustment disorder with mixed anxiety and depressed mood: Secondary | ICD-10-CM

## 2018-09-03 DIAGNOSIS — F902 Attention-deficit hyperactivity disorder, combined type: Secondary | ICD-10-CM | POA: Diagnosis not present

## 2018-09-03 DIAGNOSIS — F988 Other specified behavioral and emotional disorders with onset usually occurring in childhood and adolescence: Secondary | ICD-10-CM | POA: Diagnosis not present

## 2018-09-03 DIAGNOSIS — B360 Pityriasis versicolor: Secondary | ICD-10-CM

## 2018-09-03 DIAGNOSIS — F324 Major depressive disorder, single episode, in partial remission: Secondary | ICD-10-CM | POA: Insufficient documentation

## 2018-09-03 DIAGNOSIS — Z6830 Body mass index (BMI) 30.0-30.9, adult: Secondary | ICD-10-CM

## 2018-09-03 DIAGNOSIS — F70 Mild intellectual disabilities: Secondary | ICD-10-CM

## 2018-09-03 DIAGNOSIS — K219 Gastro-esophageal reflux disease without esophagitis: Secondary | ICD-10-CM

## 2018-09-03 MED ORDER — AMPHETAMINE-DEXTROAMPHETAMINE 30 MG PO TABS: 30.0000 mg | ORAL_TABLET | Freq: Two times a day (BID) | ORAL | 0 refills | Status: DC

## 2018-09-03 MED ORDER — KETOCONAZOLE 2 % EX SHAM: 1.0000 "application " | MEDICATED_SHAMPOO | CUTANEOUS | 3 refills | Status: DC

## 2018-09-03 MED ORDER — PANTOPRAZOLE SODIUM 40 MG PO TBEC: 40.0000 mg | DELAYED_RELEASE_TABLET | Freq: Every day | ORAL | 1 refills | Status: DC

## 2018-09-03 NOTE — Progress Notes (Signed)
Patient ID: ROYAL Mckee, male   DOB: [birth year], 33 y.o.   MRN: [id number] Subjective:     Subjective   Patient ID: [name], male   DOB: September 04, 1985, 33 y.o.   MRN: [id number]  This visit type was conducted due to national recommendations for restrictions regarding the COVID-19 Pandemic (e.g. social distancing) in an effort to limit this patient's exposure and mitigate transmission in our community. This format is felt to be most appropriate for this patient at this time.   Documentation for virtual audio and video telecommunications through Zoom encounter:  The patient was located athome. The provider was located in the office. The patient did consent to this visit and is aware of possible charges through their insurance for this visit.  The other persons participating in this telemedicine service were none. Time spent on call was15 minutes and in review of previous records >20 minutes total.  This virtual service is not related to other E/M service within previous 7 days.   HPI     Chief Complaint  Patient presents with  . med check    med check    Virtual visit today for med check.  He has a history of ADD and anxiety, history of depression- currently using Adderall 30 mg twice daily, doing fine on this without complaint.  Is able to focus with the medication, no problems with diet or appetite or sleep.   At home for now, not able to work currently due to coronaivurs.   Currently able to pay bills.   Lives with parent.     Mood -overall most days are better than worse days, does get depressed feeling at times, but this is only sometimes.  Most the time that is fine.  He does not want to go back on any other medicine for mood at this time..  Seeing counselor every 2 weeks.   He uses journaling to help with mood and dealing with issues.  Otherwise been in usual state of health  Hx/o headache -no current problems.    Obesity - 260lb.  Exercise  - 2 days per week.    GERD-doing fine on current medication  Tinea versicolor-doing fine on twice weekly with the Nizoral shampoo       Past Medical History:  Diagnosis Date  . ADD (attention deficit disorder)   . Anxiety   . Chronic headaches    not frequent as of 07/2015  . Depression   . Gastritis 03/2015   EGD Dr. [name]  . H/O echocardiogram 10/2007   normal stress echo, Dr. Jenkins Rouge  . Learning disability   . Obesity   . Tinea corporis   . Wears glasses          Current Outpatient Medications on File Prior to Visit  Medication Sig Dispense Refill  . amphetamine-dextroamphetamine (ADDERALL) 30 MG tablet Take 1 tablet by mouth 2 (two) times daily. 60 tablet 0  . ketoconazole (NIZORAL) 2 % shampoo Apply 1 application topically 2 (two) times a week. 120 mL 3  . pantoprazole (PROTONIX) 40 MG tablet TAKE 1 TABLET BY MOUTH ONCE DAILY 30 tablet 2  . risperiDONE (RISPERDAL) 1 MG tablet Take 0.5 tablets (0.5 mg total) by mouth 2 (two) times daily. Increase to 1mg  BID after 2 weeks (Patient not taking: Reported on 09/03/2018) 60 tablet 0   No current facility-administered medications on file prior to visit.     Review of Systems As in subjective  Objective:   Objective   Physical Exam  Ht 6\' 1"  (1.854 m)   Wt 260 lb (117.9 kg)   BMI 34.30 kg/m      Wt Readings from Last 3 Encounters:  09/03/18 260 lb (117.9 kg)  01/09/18 269 lb 3.2 oz (122.1 kg)  12/29/17 273 lb 12.8 oz (124.2 kg)    Due to coronavirus pandemic stay at home measures, patient visit was virtual and they were not examined in person.   Gen: nad, answers questions approprietly      Assessment:   Assessment    Encounter Diagnoses  Name Primary?  Marland Kitchen Anxiety Yes  . Attention deficit disorder, unspecified hyperactivity presence   . Depression, major, single episode, in partial remission ([place])   . Class 1 obesity without serious comorbidity with body mass  index (BMI) of 30.0 to 30.9 in adult, unspecified obesity type   . Gastroesophageal reflux disease without esophagitis   . Tinea versicolor        Plan:   Plan    I reviewed his PHQ 9, we discussed the positive answers.  He will continue counseling, refilled Adderall as below.  He declines medication for depressed mood at this time.  Discussed some good coping skills.  Follow-up 3 months for physical  GERD-continue current medicine, discussed long-term risk, trigger avoidance  Tinea versicolor-doing fine on current medication and anticipate needing to continue this as the summer , sweaty months approach   Mick was seen today for med check.  Diagnoses and all orders for this visit:  Anxiety  Attention deficit disorder, unspecified hyperactivity presence  Depression, major, single episode, in partial remission (HCC)  Class 1 obesity without serious comorbidity with body mass index (BMI) of 30.0 to 30.9 in adult, unspecified obesity type  Gastroesophageal reflux disease without esophagitis  Tinea versicolor

## 2018-09-17 ENCOUNTER — Ambulatory Visit: Payer: Medicaid Other | Admitting: Psychology

## 2018-10-15 ENCOUNTER — Ambulatory Visit (INDEPENDENT_AMBULATORY_CARE_PROVIDER_SITE_OTHER): Payer: Medicaid Other | Admitting: Psychology

## 2018-10-15 DIAGNOSIS — F902 Attention-deficit hyperactivity disorder, combined type: Secondary | ICD-10-CM | POA: Diagnosis not present

## 2018-10-15 DIAGNOSIS — F4323 Adjustment disorder with mixed anxiety and depressed mood: Secondary | ICD-10-CM

## 2018-10-15 DIAGNOSIS — F70 Mild intellectual disabilities: Secondary | ICD-10-CM

## 2018-10-29 ENCOUNTER — Ambulatory Visit (INDEPENDENT_AMBULATORY_CARE_PROVIDER_SITE_OTHER): Payer: Medicaid Other | Admitting: Psychology

## 2018-10-29 DIAGNOSIS — F70 Mild intellectual disabilities: Secondary | ICD-10-CM

## 2018-10-29 DIAGNOSIS — F902 Attention-deficit hyperactivity disorder, combined type: Secondary | ICD-10-CM | POA: Diagnosis not present

## 2018-10-29 DIAGNOSIS — F4323 Adjustment disorder with mixed anxiety and depressed mood: Secondary | ICD-10-CM | POA: Diagnosis not present

## 2018-11-12 ENCOUNTER — Ambulatory Visit (INDEPENDENT_AMBULATORY_CARE_PROVIDER_SITE_OTHER): Payer: Medicaid Other | Admitting: Psychology

## 2018-11-12 DIAGNOSIS — F4323 Adjustment disorder with mixed anxiety and depressed mood: Secondary | ICD-10-CM | POA: Diagnosis not present

## 2018-11-12 DIAGNOSIS — F70 Mild intellectual disabilities: Secondary | ICD-10-CM

## 2018-11-12 DIAGNOSIS — F900 Attention-deficit hyperactivity disorder, predominantly inattentive type: Secondary | ICD-10-CM

## 2018-11-26 ENCOUNTER — Encounter: Payer: Self-pay | Admitting: Medical

## 2018-11-26 ENCOUNTER — Other Ambulatory Visit: Payer: Self-pay

## 2018-11-26 ENCOUNTER — Ambulatory Visit: Payer: Medicaid Other | Admitting: Medical

## 2018-11-26 ENCOUNTER — Ambulatory Visit (INDEPENDENT_AMBULATORY_CARE_PROVIDER_SITE_OTHER): Payer: Medicaid Other | Admitting: Psychology

## 2018-11-26 VITALS — BP 120/80 | HR 81 | Temp 98.6°F | Resp 16 | Ht 73.0 in | Wt 261.4 lb

## 2018-11-26 DIAGNOSIS — F4323 Adjustment disorder with mixed anxiety and depressed mood: Secondary | ICD-10-CM | POA: Diagnosis not present

## 2018-11-26 DIAGNOSIS — R4589 Other symptoms and signs involving emotional state: Secondary | ICD-10-CM

## 2018-11-26 DIAGNOSIS — F70 Mild intellectual disabilities: Secondary | ICD-10-CM

## 2018-11-26 DIAGNOSIS — F902 Attention-deficit hyperactivity disorder, combined type: Secondary | ICD-10-CM | POA: Diagnosis not present

## 2018-11-26 DIAGNOSIS — B360 Pityriasis versicolor: Secondary | ICD-10-CM | POA: Diagnosis not present

## 2018-11-26 DIAGNOSIS — F39 Unspecified mood [affective] disorder: Secondary | ICD-10-CM | POA: Diagnosis not present

## 2018-11-26 DIAGNOSIS — F419 Anxiety disorder, unspecified: Secondary | ICD-10-CM

## 2018-11-26 DIAGNOSIS — F988 Other specified behavioral and emotional disorders with onset usually occurring in childhood and adolescence: Secondary | ICD-10-CM

## 2018-11-26 DIAGNOSIS — F329 Major depressive disorder, single episode, unspecified: Secondary | ICD-10-CM | POA: Diagnosis not present

## 2018-11-26 DIAGNOSIS — R4586 Emotional lability: Secondary | ICD-10-CM

## 2018-11-26 MED ORDER — KETOCONAZOLE 2 % EX SHAM: 1.0000 "application " | MEDICATED_SHAMPOO | CUTANEOUS | 3 refills | Status: DC

## 2018-11-26 MED ORDER — RISPERIDONE 1 MG PO TABS: 0.5000 mg | ORAL_TABLET | Freq: Two times a day (BID) | ORAL | 0 refills | Status: DC

## 2018-11-26 NOTE — Patient Instructions (Signed)
I am going to start you back on Risperidone   Begin 1/2 tablet twice daily for 1 week, then increase to 1 tablet twice daily  Talk with your counselor about having formal psychiatric/psychologic testing at [facility]  I want you to make an appointment to see a Psychiatrist, a doctor of psychiatry who can manage the medications for your mental health

## 2018-11-26 NOTE — Progress Notes (Signed)
Subjective: Chief Complaint  Patient presents with  . mental issue    mental issues not wanting to get out bed on and off    Here for mental health concern.     Lately feeling down, loses interest in things, lack of motivation.   Sometimes doesn't even want to get out of bed.  Last saw counselor 2 weeks ago.  Been seeing counselor regularly.   He feels that the counseling sessions are helpful.   Counselor is Administrator, [employer].   Graduated in May online, degree in [employer].   Still working with his Chief Technology Officer.  Feeling down and depressed.   Tired a lot, no energy for things, sleeping a lot.   Having trouble concentrating on things.  Has felt like a failure at times.   Hasn't had a plan for suicide but has thought about at times.   Has never attempted before.  Lives with father.  Mother lives in [place].   Relationship with both parents is good.  No specific best friend or group of friends.  Tends to be by himself.  Is dating someone.      No alcohol, no drugs.   Exercise - limited.   No over eating.    No mental health issues in family  He does endorse hearing voices.  Denies paratonia.  He denies acting on what he hears from voices.   He notes that he was told he shouldn't have survived birth as he reportedly was not expected to live through difficult child birth.  The woman that prayed over him has since died.  He seems sad he never got to talk to her about his life.  He seems to have questions about his worth and value, inadequacies.    Wants to know if he has specific mental health diagnoses.    He needs refills on his shampoo and creams.   Past Medical History:  Diagnosis Date  . ADD (attention deficit disorder)   . Anxiety   . Chronic headaches    not frequent as of 07/2015  . Depression   . Gastritis 03/2015   EGD Dr. [name]  . H/O echocardiogram 10/2007   normal stress echo, Dr. Jenkins Rouge  . Learning disability   . Obesity   . Tinea  corporis   . Wears glasses    Current Outpatient Medications on File Prior to Visit  Medication Sig Dispense Refill  . amphetamine-dextroamphetamine (ADDERALL) 30 MG tablet Take 1 tablet by mouth 2 (two) times daily. 60 tablet 0  . pantoprazole (PROTONIX) 40 MG tablet Take 1 tablet (40 mg total) by mouth daily. 90 tablet 1   No current facility-administered medications on file prior to visit.    ROS as in subjective    Objective: BP 120/80   Pulse 81   Temp 98.6 F (37 C) (Temporal)   Resp 16   Ht 6\' 1"  (1.854 m)   Wt 261 lb 6.4 oz (118.6 kg)   SpO2 98%   BMI 34.49 kg/m   Gen: wd, wn,nad Psych: pleasant, good eye contact, answers questions appropriately   Assessment: Encounter Diagnoses  Name Primary?  . Mood disorder ([place]) Yes  . Depressed mood   . Tinea versicolor   . Anxiety   . Attention deficit disorder, unspecified hyperactivity presence   . Mood swings      Plan: I advised he establish with psychiatry.  I advised he should have psychological evaluation at [facility].  I wrote this on his after visit summary to give to his counselor today for his appt today who works at the same office.  Begin trial of medication below.   We had discussed this in the fall last year but he never established with psychiatry.   I advised he f/u with me in 2 weeks as well.  Refilled Nizoral shampoo.  He wasn't sure which cream he needed refill, but this was a medication formerly given by dermatology.  He will find the cream at home and let me know.  It was a cream for acne he thinks.      Vadhir was seen today for mental issue.  Diagnoses and all orders for this visit:  Mood disorder ([place])  Depressed mood  Tinea versicolor -     ketoconazole (NIZORAL) 2 % shampoo; Apply 1 application topically 2 (two) times a week.  Anxiety  Attention deficit disorder, unspecified hyperactivity presence  Mood swings  Other orders -     risperiDONE (RISPERDAL) 1 MG  tablet; Take 0.5 tablets (0.5 mg total) by mouth 2 (two) times daily. Increase to 1mg  BID after 2 weeks

## 2018-12-10 ENCOUNTER — Ambulatory Visit (INDEPENDENT_AMBULATORY_CARE_PROVIDER_SITE_OTHER): Payer: Medicaid Other | Admitting: Psychology

## 2018-12-10 DIAGNOSIS — F902 Attention-deficit hyperactivity disorder, combined type: Secondary | ICD-10-CM | POA: Diagnosis not present

## 2018-12-10 DIAGNOSIS — F70 Mild intellectual disabilities: Secondary | ICD-10-CM | POA: Diagnosis not present

## 2018-12-10 DIAGNOSIS — F4323 Adjustment disorder with mixed anxiety and depressed mood: Secondary | ICD-10-CM | POA: Diagnosis not present

## 2018-12-19 IMAGING — DX DG CHEST 2V
3 series · 3 of 3 positions shown · non-contrast
Comparison: 11/09/2007.

CLINICAL DATA: Cough and congestion.

EXAM:
CHEST  2 VIEW

[dg chest 2 view (1 of 3)]
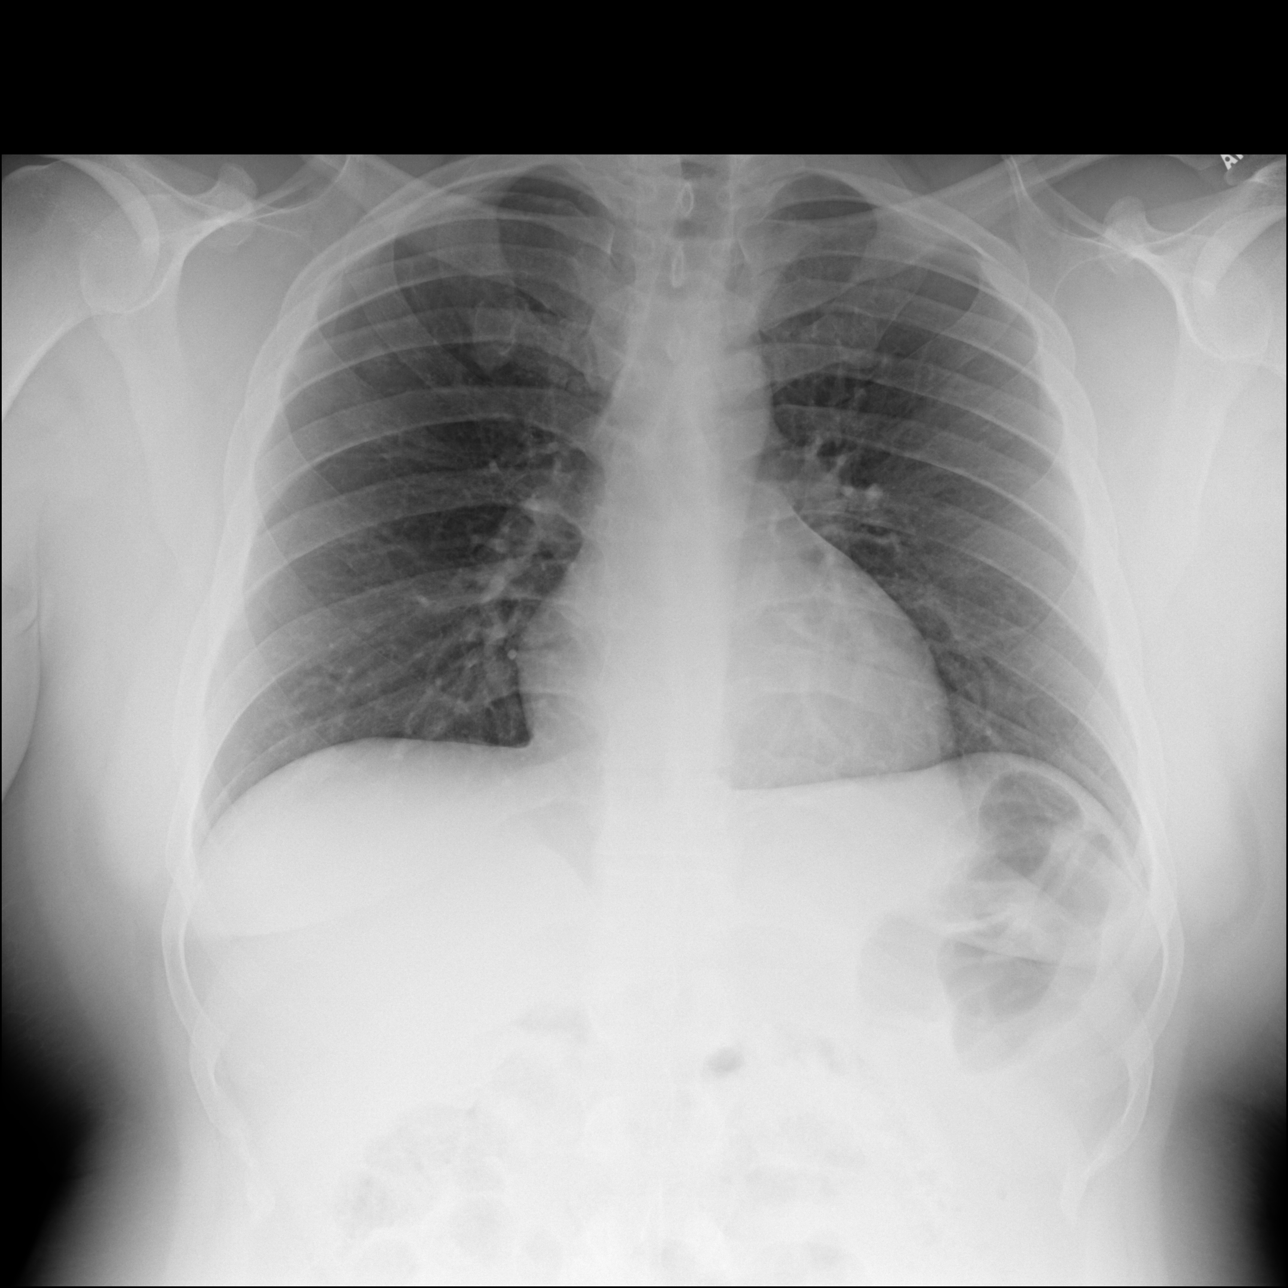

[dg chest 2 view (2 of 3)]
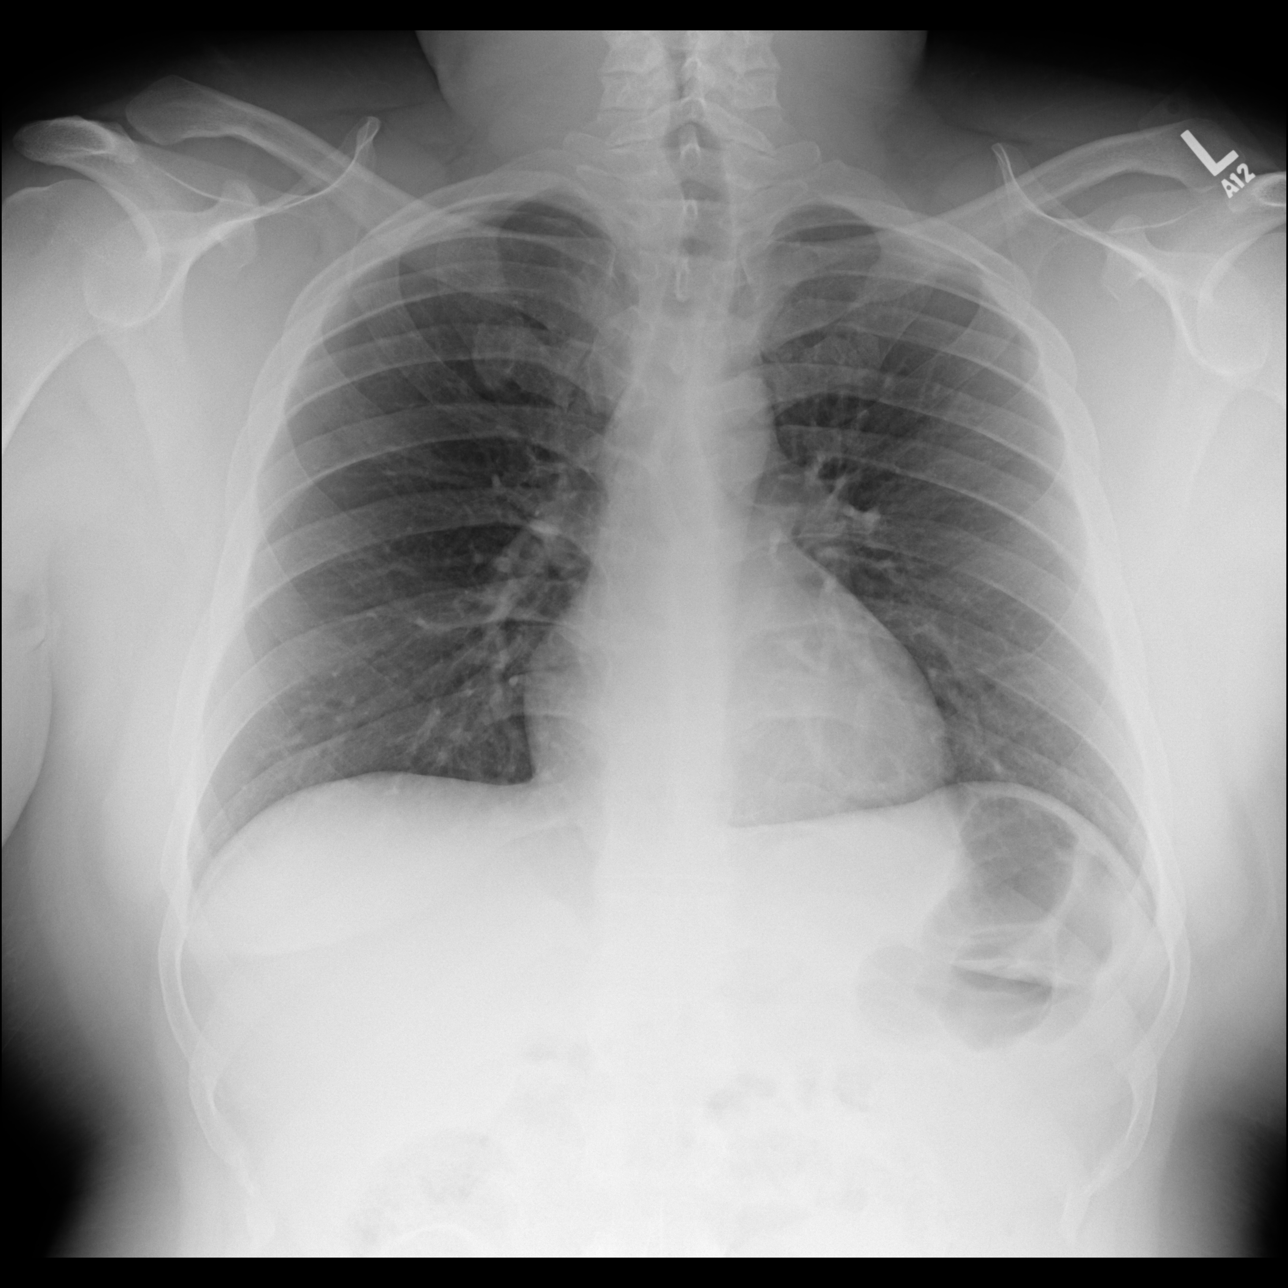

[dg chest 2 view (3 of 3)]
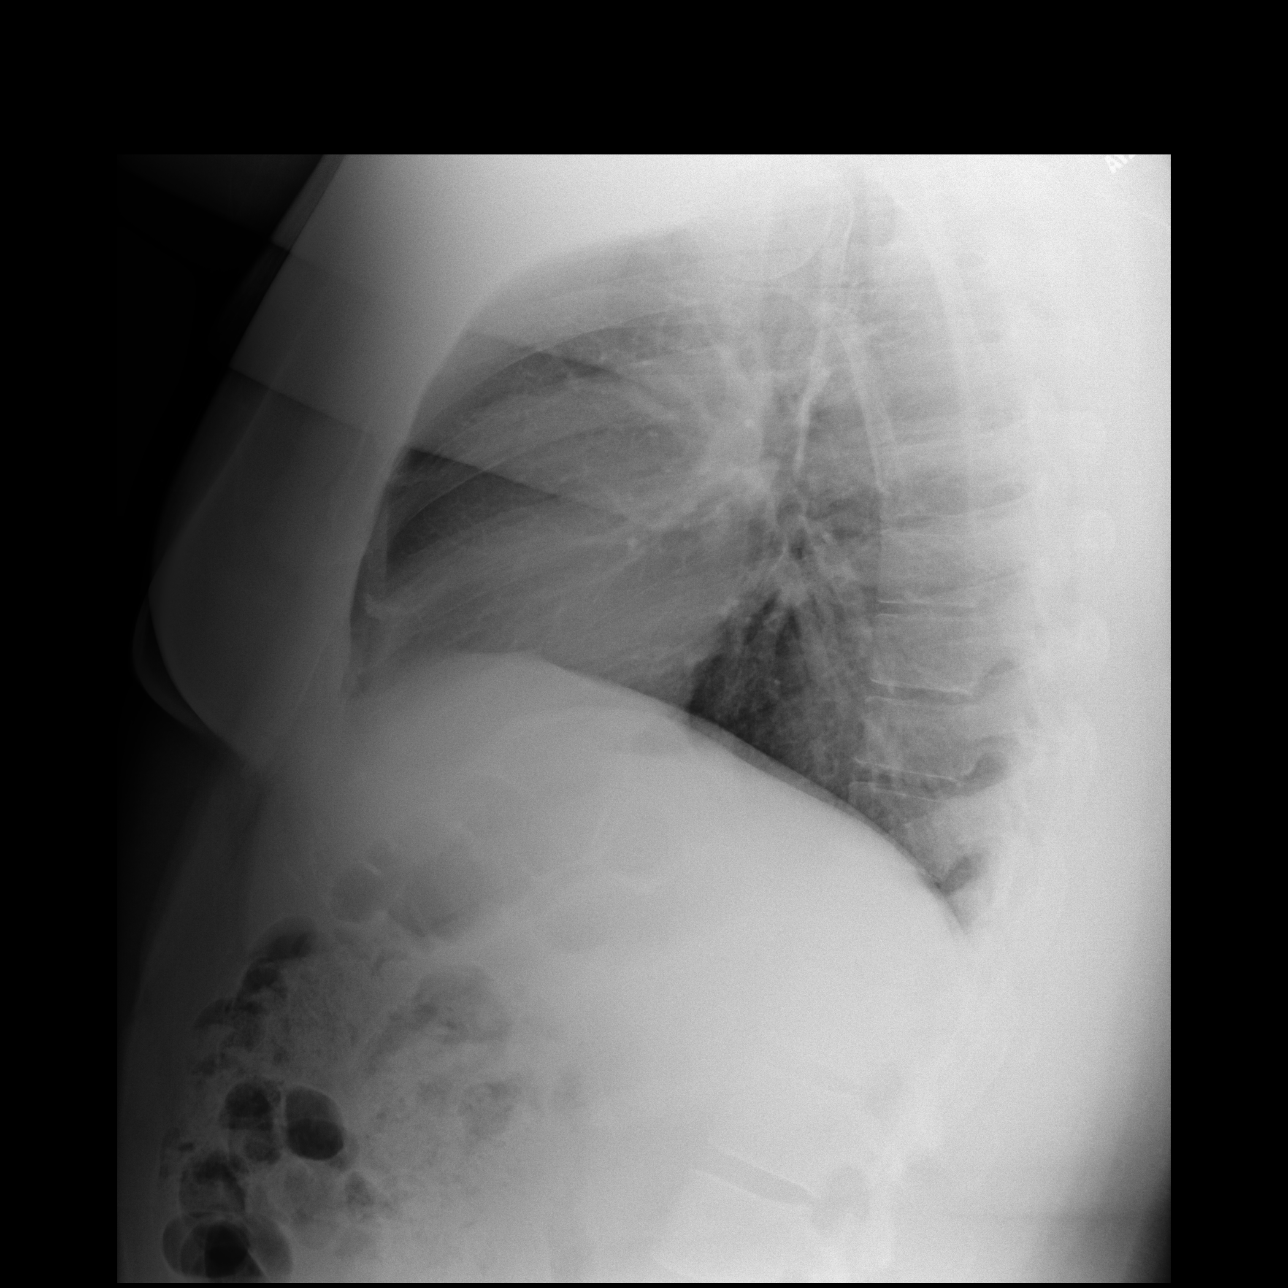

[3 of 3 positions shown; findings below may reference images not displayed]

FINDINGS: Mediastinum and hilar structures normal. Lungs are clear. Heart size
normal. No pleural effusion or pneumothorax. No acute bony
abnormality. Mild thoracic spine scoliosis.
IMPRESSION: No acute cardiopulmonary disease.

## 2018-12-24 ENCOUNTER — Ambulatory Visit (INDEPENDENT_AMBULATORY_CARE_PROVIDER_SITE_OTHER): Payer: Medicaid Other | Admitting: Psychology

## 2018-12-24 DIAGNOSIS — F902 Attention-deficit hyperactivity disorder, combined type: Secondary | ICD-10-CM

## 2018-12-24 DIAGNOSIS — F4323 Adjustment disorder with mixed anxiety and depressed mood: Secondary | ICD-10-CM

## 2018-12-24 DIAGNOSIS — F70 Mild intellectual disabilities: Secondary | ICD-10-CM

## 2019-01-07 ENCOUNTER — Ambulatory Visit (INDEPENDENT_AMBULATORY_CARE_PROVIDER_SITE_OTHER): Payer: Medicaid Other | Admitting: Psychology

## 2019-01-07 DIAGNOSIS — F70 Mild intellectual disabilities: Secondary | ICD-10-CM

## 2019-01-07 DIAGNOSIS — F902 Attention-deficit hyperactivity disorder, combined type: Secondary | ICD-10-CM | POA: Diagnosis not present

## 2019-01-07 DIAGNOSIS — F4323 Adjustment disorder with mixed anxiety and depressed mood: Secondary | ICD-10-CM | POA: Diagnosis not present

## 2019-01-21 ENCOUNTER — Ambulatory Visit: Payer: Medicaid Other | Admitting: Psychology

## 2019-02-04 ENCOUNTER — Ambulatory Visit (INDEPENDENT_AMBULATORY_CARE_PROVIDER_SITE_OTHER): Payer: Medicaid Other | Admitting: Psychology

## 2019-02-04 DIAGNOSIS — F70 Mild intellectual disabilities: Secondary | ICD-10-CM | POA: Diagnosis not present

## 2019-02-04 DIAGNOSIS — F4323 Adjustment disorder with mixed anxiety and depressed mood: Secondary | ICD-10-CM

## 2019-02-04 DIAGNOSIS — F902 Attention-deficit hyperactivity disorder, combined type: Secondary | ICD-10-CM

## 2019-02-18 ENCOUNTER — Ambulatory Visit: Payer: Medicaid Other | Admitting: Psychology

## 2019-02-23 ENCOUNTER — Encounter: Payer: Self-pay | Admitting: Medical

## 2019-02-23 ENCOUNTER — Other Ambulatory Visit: Payer: Self-pay

## 2019-02-23 ENCOUNTER — Ambulatory Visit: Payer: Medicaid Other | Admitting: Medical

## 2019-02-23 VITALS — BP 130/86 | HR 82 | Temp 98.0°F | Ht 73.0 in | Wt 268.2 lb

## 2019-02-23 DIAGNOSIS — B360 Pityriasis versicolor: Secondary | ICD-10-CM

## 2019-02-23 DIAGNOSIS — Z23 Encounter for immunization: Secondary | ICD-10-CM | POA: Diagnosis not present

## 2019-02-23 DIAGNOSIS — L219 Seborrheic dermatitis, unspecified: Secondary | ICD-10-CM | POA: Diagnosis not present

## 2019-02-23 DIAGNOSIS — L989 Disorder of the skin and subcutaneous tissue, unspecified: Secondary | ICD-10-CM

## 2019-02-23 MED ORDER — KETOCONAZOLE 2 % EX SHAM: 1.0000 "application " | MEDICATED_SHAMPOO | CUTANEOUS | 3 refills | Status: DC

## 2019-02-23 MED ORDER — TRETINOIN 0.05 % EX CREA: TOPICAL_CREAM | Freq: Every day | CUTANEOUS | 0 refills | Status: DC

## 2019-02-23 NOTE — Progress Notes (Addendum)
Subjective: Chief Complaint  Patient presents with  . Consult    wants referral to dermatologist for scalp disease and moles   Here for concerns.  Wants referral to dermatology  Has seen [facility] in the past.  Wants referral back to them since he has new insurance card since last year.  Has new skin lesion on left cheek that won't resolve despite various creams he tried OTC.  Also has ongoing problems with scalp.    His facial fungus rash resolved with Nizoral.   Wants flu shot today  Last visit we discussed bipolar disorder, depressed mood.  Still sees counselor regularly but still hasn't had appt with psychiatry.   Still working on psychiatry appt.   he was not taking his Risperdal for period time but is back on it now.  Overall doing okay.  No recent major mood swings, not as depressed feelings last visit.  He might be moving to different house, so this has caused delay as well and seeing psychiatry       Past Medical History:  Diagnosis Date  . ADD (attention deficit disorder)   . Anxiety   . Chronic headaches    not frequent as of 07/2015  . Depression   . Gastritis 03/2015   EGD Dr. [name]  . H/O echocardiogram 10/2007   normal stress echo, Dr. Jenkins Rouge  . Learning disability   . Obesity   . Tinea corporis   . Wears glasses    Current Outpatient Medications on File Prior to Visit  Medication Sig Dispense Refill  . amphetamine-dextroamphetamine (ADDERALL) 30 MG tablet Take 1 tablet by mouth 2 (two) times daily. 60 tablet 0  . pantoprazole (PROTONIX) 40 MG tablet Take 1 tablet (40 mg total) by mouth daily. 90 tablet 1  . risperiDONE (RISPERDAL) 1 MG tablet Take 0.5 tablets (0.5 mg total) by mouth 2 (two) times daily. Increase to 1mg  BID after 2 weeks 60 tablet 0   No current facility-administered medications on file prior to visit.    ROS as in subjective   Objective: BP 130/86   Pulse 82   Temp 98 F (36.7 C)   Ht 6\' 1"  (1.854 m)   Wt 268  lb 3.2 oz (121.7 kg)   SpO2 97%   BMI 35.38 kg/m    Gen: wd, wn, nad Skin: Left cheek with raised round 3 mm brownish lesion uniform, several small slightly raised maculopapular lesions on the upper left and right chest, the prior hypopigmented fungal rash on the face and cheek has resolved Psych: Pleasant, good eye contact, answers questions appropriately    Assessment: Encounter Diagnoses  Name Primary?  . Tinea versicolor Yes  . Skin lesion   . Seborrheic dermatitis of scalp   . Need for influenza vaccination      Plan: Tinea versicolor - resolved on nizoral  Skin lesions, left cheek, upper anterior chest - referral to dermatology.  Can use tretinoin for the individual lesion on the left cheek and a few little small lesions on the chest in the short-term until he sees dermatology  Bipolar disorder, depressed mood -he notes that he is taking the Risperdal currently but had not been taking some for a while.  He is still trying to get appointment psychiatry.  He still sees a Social worker regularly.  He declines refill today   Counseled on the influenza virus vaccine.  Vaccine information sheet given.  Influenza vaccine given after consent obtained.   Julie was  seen today for consult.  Diagnoses and all orders for this visit:  Tinea versicolor -     ketoconazole (NIZORAL) 2 % shampoo; Apply 1 application topically 2 (two) times a week. -     Ambulatory referral to Dermatology  Skin lesion -     Ambulatory referral to Dermatology  Seborrheic dermatitis of scalp -     Ambulatory referral to Dermatology  Need for influenza vaccination -     Flu Vaccine QUAD 6+ mos PF IM (Fluarix Quad PF)  Other orders -     tretinoin (RETIN-A) 0.05 % cream; Apply topically at bedtime.

## 2019-02-23 NOTE — Addendum Note (Signed)
Addended by: Carlena Hurl on: 02/23/2019 11:03 AM   Modules accepted: Level of Service

## 2019-03-04 ENCOUNTER — Ambulatory Visit (INDEPENDENT_AMBULATORY_CARE_PROVIDER_SITE_OTHER): Payer: Medicaid Other | Admitting: Psychology

## 2019-03-04 DIAGNOSIS — F4323 Adjustment disorder with mixed anxiety and depressed mood: Secondary | ICD-10-CM

## 2019-03-04 DIAGNOSIS — F70 Mild intellectual disabilities: Secondary | ICD-10-CM | POA: Diagnosis not present

## 2019-03-04 DIAGNOSIS — F900 Attention-deficit hyperactivity disorder, predominantly inattentive type: Secondary | ICD-10-CM

## 2019-03-08 ENCOUNTER — Telehealth: Payer: Self-pay | Admitting: Medical

## 2019-03-08 NOTE — Telephone Encounter (Signed)
  Pt called dermatology office told him that there was something missing from referral ( he does not know what) and they are sending it back to [place]

## 2019-03-08 NOTE — Telephone Encounter (Signed)
Medicaid form has been sent to Dermatology office.

## 2019-03-10 ENCOUNTER — Telehealth: Payer: Self-pay | Admitting: Medical

## 2019-03-10 ENCOUNTER — Other Ambulatory Visit: Payer: Self-pay | Admitting: Medical

## 2019-03-10 DIAGNOSIS — R4589 Other symptoms and signs involving emotional state: Secondary | ICD-10-CM

## 2019-03-10 DIAGNOSIS — R4586 Emotional lability: Secondary | ICD-10-CM

## 2019-03-10 DIAGNOSIS — F324 Major depressive disorder, single episode, in partial remission: Secondary | ICD-10-CM

## 2019-03-10 DIAGNOSIS — F419 Anxiety disorder, unspecified: Secondary | ICD-10-CM

## 2019-03-10 NOTE — Telephone Encounter (Signed)
Please refer to [place] [facility].

## 2019-03-10 NOTE — Telephone Encounter (Signed)
Pt called requesting that you sent referral for him to  [facility]

## 2019-03-12 NOTE — Addendum Note (Signed)
Addended by: Edgar Frisk on: 03/12/2019 02:36 PM   Modules accepted: Orders

## 2019-03-18 ENCOUNTER — Ambulatory Visit: Payer: Medicaid Other | Admitting: Psychology

## 2019-03-22 ENCOUNTER — Telehealth: Payer: Self-pay | Admitting: Medical

## 2019-03-22 NOTE — Telephone Encounter (Signed)
Tried contacting patient, unable to reach him

## 2019-03-22 NOTE — Telephone Encounter (Signed)
Please check on the status of this mental health referral which I believe was to either counseling or psychiatrist.  I believe his insurance required the referral to psychiatrist, and maybe a second 1 to the counselor.  I got a message back from psychiatry stating he already has a counselor so I do not know if they still need paperwork, but apparently their psychiatrist cannot see him for some reason.  So he needs to figure out where he wants to go for a psychiatrist if we need to refer somewhere else

## 2019-03-25 ENCOUNTER — Telehealth: Payer: Self-pay

## 2019-03-25 NOTE — Telephone Encounter (Signed)
Pt. Called back stating that he had received a call from [place], I explained it was from [facility] and was about a referral. He said he wanted to see [facility] on [facility]. And gave me there phone number of [phone].

## 2019-03-26 NOTE — Telephone Encounter (Signed)
Please call patient and just verify if we need to do anything.   He should be seeing a counselor and psychiatrist.  Due to his insurance he needs an official referral which I think we have already done.

## 2019-03-26 NOTE — Telephone Encounter (Signed)
I am confused as it appears patient already sees behavioral health. Please advise.

## 2019-03-30 ENCOUNTER — Telehealth: Payer: Self-pay

## 2019-03-30 NOTE — Telephone Encounter (Signed)
We had been unable to see referral info placed in chart due to not having proper clearance. Message has been sent back to behavioral in response to them saying the selected MD was not taking new patients. Message has been sent inquiring whether there is any providers there taking new patients.

## 2019-03-30 NOTE — Telephone Encounter (Signed)
Patient has been advised that we are waiting to hear back from [facility]. Patient stated he was seen at [facility] and they recommend he be tested for diabetes.

## 2019-03-31 NOTE — Telephone Encounter (Signed)
Go ahead and set up for yearly physical as it has been a while.  Fasting.

## 2019-03-31 NOTE — Telephone Encounter (Signed)
Left detail message on machine for patient to call back and schedule his physical.

## 2019-04-01 ENCOUNTER — Ambulatory Visit (INDEPENDENT_AMBULATORY_CARE_PROVIDER_SITE_OTHER): Payer: Medicaid Other | Admitting: Psychology

## 2019-04-01 DIAGNOSIS — F900 Attention-deficit hyperactivity disorder, predominantly inattentive type: Secondary | ICD-10-CM

## 2019-04-01 DIAGNOSIS — F70 Mild intellectual disabilities: Secondary | ICD-10-CM | POA: Diagnosis not present

## 2019-04-01 DIAGNOSIS — F4323 Adjustment disorder with mixed anxiety and depressed mood: Secondary | ICD-10-CM | POA: Diagnosis not present

## 2019-04-08 ENCOUNTER — Ambulatory Visit: Payer: Medicaid Other | Admitting: Psychology

## 2019-04-22 ENCOUNTER — Other Ambulatory Visit: Payer: Self-pay

## 2019-04-22 ENCOUNTER — Ambulatory Visit (INDEPENDENT_AMBULATORY_CARE_PROVIDER_SITE_OTHER): Payer: Medicaid Other | Admitting: Psychiatry

## 2019-04-22 ENCOUNTER — Ambulatory Visit: Payer: Medicaid Other | Admitting: Psychology

## 2019-04-22 DIAGNOSIS — F29 Unspecified psychosis not due to a substance or known physiological condition: Secondary | ICD-10-CM

## 2019-04-22 DIAGNOSIS — F411 Generalized anxiety disorder: Secondary | ICD-10-CM | POA: Insufficient documentation

## 2019-04-22 DIAGNOSIS — F060 Psychotic disorder with hallucinations due to known physiological condition: Secondary | ICD-10-CM

## 2019-04-22 MED ORDER — ZIPRASIDONE HCL 20 MG PO CAPS: 20.0000 mg | ORAL_CAPSULE | Freq: Two times a day (BID) | ORAL | 0 refills | Status: DC

## 2019-04-22 NOTE — Progress Notes (Signed)
Psychiatric Initial Adult Assessment   Patient Identification: [name] MRN:  [id number] Date of Evaluation:  04/22/2019 Referral Source: [name] PA-C Chief Complaint:  Auditory hallucinations. 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 Generalized anxiety disorder  F41.1   Interview was conducted using WebEx teleconferencing application and I verified that I was speaking with the correct person using two identifiers. I discussed the limitations of evaluation and management by telemedicine and  the availability of in person appointments. Patient expressed understanding and agreed to proceed.  History of Present Illness:  Karn is a 33 yo single AA male who presents with concerns about recurrent perceptual disturbances that worry his new girlfriend as they will often contribute to him talking loudly to self which she perceives as "crazy behavior". Nicholaas describes occasionally hearing male orr male voice which are not saying anything bad about him and are not commanding in nature. He cannot identify any triggers (stress) and he maintains full reality testing ability, I.e. is fully aware that these voices are not real. He had a hx of feeling depressed and in the past having passive suicidal ideation but does not associate AH with worsening depression. He also has a hx of excessive worrying but again does not see a correlation between the two.  He denies having hx of abuse/trauma. He has a hx of learning disability and ADHD and was on and off on stimulants . Again he did not noticed increase in frequency or intensity of AH when he was taking dextroamphetamine. At this time he does not take Adderall or any other psychotropic meds. He has tried risperidone 1 mg bid for a few weeks but did not notice any benefit. At this time he denies feeling particularly depressed or anxious ( except for potential impact his lod talking to self can have on his  relationship). He states that he was "a wild child with attitude" but matured. He was in counseling because of that in the past.  His sleep is good and so is appetite. He has no delusions, no problems with concentration or memory. He feels tired and has low motivation, in general. He is not suicidal. He has no hx of mania (occasional irritability only), alcohol or drug abuse. There is no family psychiatric history to his knowledge.  He lives with his father and relationship is good. He graduated from a [employer] program this year and hopes to get a job next year. He has a girlfriend for the first time in his life and is very serious about this relationship.  Associated Signs/Symptoms: Depression Symptoms:  fatigue, anxiety, (Hypo) Manic Symptoms:  None. Anxiety Symptoms:  Excessive Worry, Psychotic Symptoms:  Hallucinations: Auditory PTSD Symptoms: Negative  Past Psychiatric History: see above  Previous Psychotropic Medications: Yes   Substance Abuse History in the last 12 months:  No.  Consequences of Substance Abuse: NA  Past Medical History:  Past Medical History:  Diagnosis Date  . ADD (attention deficit disorder)   . Anxiety   . Chronic headaches    not frequent as of 07/2015  . Depression   . Gastritis 03/2015   EGD Dr. [name]  . H/O echocardiogram 10/2007   normal stress echo, Dr. Jenkins Rouge  . Learning disability   . Obesity   . Tinea corporis   . Wears glasses     Past Surgical History:  Procedure Laterality Date  . ESOPHAGOGASTRODUODENOSCOPY  03/2015  gastritis; Dr. [name]  . NO PAST SURGERIES  07/2015    Family Psychiatric History: None.  Family History:  Family History  Problem Relation Age of Onset  . Hypertension Mother   . Deep vein thrombosis Mother   . Multiple sclerosis Father   . Diabetes Paternal Aunt   . Heart disease Paternal Uncle   . Prostate cancer Paternal Grandfather   . Stroke Neg Hx     Social History:   Social  History   Socioeconomic History  . Marital status: Single    Spouse name: Not on file  . Number of children: 0  . Years of education: Not on file  . Highest education level: Not on file  Occupational History  . Occupation: Ship broker  Social Needs  . Financial resource strain: Not on file  . Food insecurity    Worry: Not on file    Inability: Not on file  . Transportation needs    Medical: Not on file    Non-medical: Not on file  Tobacco Use  . Smoking status: Never Smoker  . Smokeless tobacco: Never Used  Substance and Sexual Activity  . Alcohol use: No  . Drug use: No  . Sexual activity: Yes    Partners: Female  Lifestyle  . Physical activity    Days per week: Not on file    Minutes per session: Not on file  . Stress: Not on file  Relationships  . Social Herbalist on phone: Not on file    Gets together: Not on file    Attends religious service: Not on file    Active member of club or organization: Not on file    Attends meetings of clubs or organizations: Not on file    Relationship status: Not on file  Other Topics Concern  . Not on file  Social History Narrative   Lives with father.  At [employer] for [employer], does modeling and acting on the side.   Exercise- some weights, walking.  Diet - ok, avoids soda, tries to eat healthy    Allergies:   Allergies  Allergen Reactions  . Grass Extracts [Gramineae Pollens] Swelling    Metabolic Disorder Labs: Lab Results  Component Value Date   HGBA1C 5.1 07/22/2016   MPG 105 07/21/2015   MPG 105 08/11/2014   No results found for: PROLACTIN Lab Results  Component Value Date   CHOL 199 07/21/2015   TRIG 58 07/21/2015   HDL 58 07/21/2015   CHOLHDL 3.4 07/21/2015   VLDL 12 07/21/2015   LDLCALC 129 07/21/2015   LDLCALC 129 (H) 08/11/2014   Lab Results  Component Value Date   TSH 1.69 07/21/2015    Therapeutic Level Labs: No results found for: LITHIUM No results found for: CBMZ No results found  for: VALPROATE  Current Medications: Current Outpatient Medications  Medication Sig Dispense Refill  . amphetamine-dextroamphetamine (ADDERALL) 30 MG tablet Take 1 tablet by mouth 2 (two) times daily. 60 tablet 0  . ketoconazole (NIZORAL) 2 % shampoo Apply 1 application topically 2 (two) times a week. 120 mL 3  . pantoprazole (PROTONIX) 40 MG tablet Take 1 tablet (40 mg total) by mouth daily. 90 tablet 1  . tretinoin (RETIN-A) 0.05 % cream Apply topically at bedtime. 45 g 0  . ziprasidone (GEODON) 20 MG capsule Take 1 capsule (20 mg total) by mouth 2 (two) times daily with a meal. 60 capsule 0   No current facility-administered medications for  this visit.    Psychiatric Specialty Exam: Review of Systems  Constitutional: Positive for malaise/fatigue.  Gastrointestinal: Positive for heartburn.  All other systems reviewed and are negative.   There were no vitals taken for this visit.There is no height or weight on file to calculate BMI.  General Appearance: Casual and Well Groomed  Eye Contact:  Good  Speech:  Clear and Coherent and Normal Rate  Volume:  Normal  Mood:  Mildly anxious.  Affect:  Full Range  Thought Process:  Goal Directed and Linear  Orientation:  Full (Time, Place, and Person)  Thought Content:  Logical and Hallucinations: Auditory  Suicidal Thoughts:  No  Homicidal Thoughts:  No  Memory:  Immediate;   Good Recent;   Good Remote;   Good  Judgement:  Good  Insight:  Fair  Psychomotor Activity:  Normal  Concentration:  Concentration: Good and Attention Span: Good  Recall:  Good  Fund of Knowledge:Good  Language: Good  Akathisia:  Negative  Handed:  Right  AIMS (if indicated):  not done  Assets:  Communication Skills Desire for [place] Talents/Skills Transportation  ADL's:  Intact  Cognition: WNL  Sleep:  Good   Screenings: PHQ2-9     Office Visit from 11/26/2018 in [place]  from 09/03/2018 in [place] Visit from 12/29/2017 in [place] Visit from 07/22/2016 in [place] Visit from 05/23/2016 in [place]  PHQ-2 Total Score  3  2  6  4  2   PHQ-9 Total Score  13  8  16  16  7       Assessment and Plan: 33 yo single AA male who presents with concerns about recurrent perceptual disturbances that worry his new girlfriend as they will often contribute to him talking loudly to self which she perceives as "crazy behavior". Efrin describes occasionally hearing male orr male voice which are not saying anything bad about him and are not commanding in nature. He cannot identify any triggers (stress) and he maintains full reality testing ability, I.e. is fully aware that these voices are not real. He had a hx of feeling depressed and in the past having passive suicidal ideation but does not associate AH with worsening depression. He also has a hx of excessive worrying but again does not see a correlation between the two.  He denies having hx of abuse/trauma. He has a hx of learning disability and ADHD and was on and off on stimulants . Again he did not noticed increase in frequency or intensity of AH when he was taking dextroamphetamine. At this time he does not take Adderall or any other psychotropic meds. He has tried risperidone 1 mg bid for a few weeks but did not notice any benefit. At this time he denies feeling particularly depressed or anxious ( except for potential impact his lod talking to self can have on his relationship). He states that he was "a wild child with attitude" but matured. He was in counseling because of that in the past.  His sleep is good and so is appetite. He has no delusions, no problems with concentration or memory. He feels tired and has low motivation, in general. He is not suicidal. He has no hx of mania (occasional irritability only), alcohol or drug abuse. There is no family  psychiatric history to his knowledge.  Dx: Psychotic disorder with hallucinations, unspecified; Anxiety - GAD and ADHD by hx.  His symptoms do not meet criteria for schizophrenia - no delusions, no thought or behavioral disorganization, no negative symptoms. No substance abuse or mood disorder component either. He is fully aware that the voices he hears are not real but still tends to "talk with them" - responds. He is interested in trying a medication or two to help them stop.  Plan: We will first try ziprasidone (he is overweight 265 lbs at 6 feet 2 inches hight) which does not carry a risk of weight gain. We will start at 20 mg bid with possible dose increase in 2-3 weeks. If it is not helpful we could try aripiprazole next. Next appointment on December 30. The plan was discussed with patient who had an opportunity to ask questions and these were all answered. I spend 60 minutes in videoconferencing with the patient and devoted approximately 50% of this time to explanation of diagnosis, discussion of treatment options and med education.  Stephanie Acre, MD 12/3/20203:30 PM

## 2019-04-29 ENCOUNTER — Ambulatory Visit: Payer: Medicaid Other | Admitting: Psychology

## 2019-05-06 ENCOUNTER — Ambulatory Visit: Payer: Medicaid Other | Admitting: Psychology

## 2019-05-07 ENCOUNTER — Encounter: Payer: Self-pay | Admitting: Medical

## 2019-05-07 ENCOUNTER — Ambulatory Visit (INDEPENDENT_AMBULATORY_CARE_PROVIDER_SITE_OTHER): Payer: Medicaid Other | Admitting: Medical

## 2019-05-07 ENCOUNTER — Other Ambulatory Visit: Payer: Self-pay

## 2019-05-07 VITALS — BP 130/88 | HR 74 | Temp 98.2°F | Ht 73.0 in | Wt 270.0 lb

## 2019-05-07 DIAGNOSIS — Z131 Encounter for screening for diabetes mellitus: Secondary | ICD-10-CM

## 2019-05-07 DIAGNOSIS — Z13220 Encounter for screening for lipoid disorders: Secondary | ICD-10-CM

## 2019-05-07 DIAGNOSIS — Z6835 Body mass index (BMI) 35.0-35.9, adult: Secondary | ICD-10-CM | POA: Diagnosis not present

## 2019-05-07 DIAGNOSIS — Z0000 Encounter for general adult medical examination without abnormal findings: Secondary | ICD-10-CM

## 2019-05-07 DIAGNOSIS — K219 Gastro-esophageal reflux disease without esophagitis: Secondary | ICD-10-CM

## 2019-05-07 DIAGNOSIS — F324 Major depressive disorder, single episode, in partial remission: Secondary | ICD-10-CM

## 2019-05-07 DIAGNOSIS — F411 Generalized anxiety disorder: Secondary | ICD-10-CM

## 2019-05-07 DIAGNOSIS — F988 Other specified behavioral and emotional disorders with onset usually occurring in childhood and adolescence: Secondary | ICD-10-CM

## 2019-05-07 NOTE — Progress Notes (Signed)
Subjective:   HPI  [name] is a 33 y.o. male who presents for Chief Complaint  Patient presents with  . Annual Exam    with fasting labs     Patient Care Team: Brettany Sydney, Camelia Eng, PA-C as PCP - General (Family Medicine) Sees dentist Sees eye doctor Just recently established with psychiatry  Concerns: Doing fine  Reviewed their medical, surgical, family, social, medication, and allergy history and updated chart as appropriate.  Past Medical History:  Diagnosis Date  . ADD (attention deficit disorder)   . Anxiety   . Depression   . Gastritis 03/2015   EGD Dr. [name]  . H/O echocardiogram 10/2007   normal stress echo, Dr. Jenkins Rouge  . Learning disability   . Obesity   . Tinea corporis   . Wears glasses     Past Surgical History:  Procedure Laterality Date  . ESOPHAGOGASTRODUODENOSCOPY  03/2015   gastritis; Dr. [name]    Social History   Socioeconomic History  . Marital status: Single    Spouse name: Not on file  . Number of children: 0  . Years of education: Not on file  . Highest education level: Not on file  Occupational History  . Occupation: Ship broker  Tobacco Use  . Smoking status: Never Smoker  . Smokeless tobacco: Never Used  Substance and Sexual Activity  . Alcohol use: No  . Drug use: No  . Sexual activity: Yes    Partners: Female  Other Topics Concern  . Not on file  Social History Narrative   Lives with father.   Has degree from [employer] for [employer], does modeling and acting on the side.   Exercise- some weights, walking.  Diet - ok, avoids soda, tries to eat healthy.  04/2019   Social Determinants of Health   Financial Resource Strain:   . Difficulty of Paying Living Expenses: Not on file  Food Insecurity:   . Worried About Charity fundraiser in the Last Year: Not on file  . Ran Out of Food in the Last Year: Not on file  Transportation Needs:   . Lack of Transportation (Medical): Not on file  . Lack of  Transportation (Non-Medical): Not on file  Physical Activity:   . Days of Exercise per Week: Not on file  . Minutes of Exercise per Session: Not on file  Stress:   . Feeling of Stress : Not on file  Social Connections:   . Frequency of Communication with Friends and Family: Not on file  . Frequency of Social Gatherings with Friends and Family: Not on file  . Attends Religious Services: Not on file  . Active Member of Clubs or Organizations: Not on file  . Attends Archivist Meetings: Not on file  . Marital Status: Not on file  Intimate Partner Violence:   . Fear of Current or Ex-Partner: Not on file  . Emotionally Abused: Not on file  . Physically Abused: Not on file  . Sexually Abused: Not on file    Family History  Problem Relation Age of Onset  . Hypertension Mother   . Deep vein thrombosis Mother   . Multiple sclerosis Father   . Diabetes Paternal Aunt   . Heart disease Paternal Uncle   . Prostate cancer Paternal Grandfather   . Cancer Paternal Grandmother        uterine  . Stroke Neg Hx      Current Outpatient Medications:  .  amphetamine-dextroamphetamine (  ADDERALL) 30 MG tablet, Take 1 tablet by mouth 2 (two) times daily., Disp: 60 tablet, Rfl: 0 .  ketoconazole (NIZORAL) 2 % shampoo, Apply 1 application topically 2 (two) times a week., Disp: 120 mL, Rfl: 3 .  pantoprazole (PROTONIX) 40 MG tablet, Take 1 tablet (40 mg total) by mouth daily., Disp: 90 tablet, Rfl: 1 .  tretinoin (RETIN-A) 0.05 % cream, Apply topically at bedtime., Disp: 45 g, Rfl: 0 .  ziprasidone (GEODON) 20 MG capsule, Take 1 capsule (20 mg total) by mouth 2 (two) times daily with a meal., Disp: 60 capsule, Rfl: 0  Allergies  Allergen Reactions  . Grass Extracts [Gramineae Pollens] Swelling       Review of Systems Constitutional: -fever, -chills, -sweats, -unexpected weight change, -decreased appetite, -fatigue Allergy: -sneezing, -itching, -congestion Dermatology: -changing  moles, --rash, -lumps ENT: -runny nose, -ear pain, -sore throat, -hoarseness, -sinus pain, -teeth pain, - ringing in ears, -hearing loss, -nosebleeds Cardiology: -chest pain, -palpitations, -swelling, -difficulty breathing when lying flat, -waking up short of breath Respiratory: -cough, -shortness of breath, -difficulty breathing with exercise or exertion, -wheezing, -coughing up blood Gastroenterology: -abdominal pain, -nausea, -vomiting, -diarrhea, -constipation, -blood in stool, -changes in bowel movement, -difficulty swallowing or eating Hematology: -bleeding, -bruising  Musculoskeletal: -joint aches, -muscle aches, -joint swelling, -back pain, -neck pain, -cramping, -changes in gait Ophthalmology: denies vision changes, eye redness, itching, discharge Urology: -burning with urination, -difficulty urinating, -blood in urine, -urinary frequency, -urgency, -incontinence Neurology: -headache, -weakness, -tingling, -numbness, -memory loss, -falls, -dizziness Psychology: -depressed mood, -agitation, -sleep problems Male GU: no testicular mass, pain, no lymph nodes swollen, no swelling, no rash.     Objective:  BP 130/88   Pulse 74   Temp 98.2 F (36.8 C)   Ht 6\' 1"  (1.854 m)   Wt 270 lb (122.5 kg)   SpO2 97%   BMI 35.62 kg/m   General appearance: alert, no distress, WD/WN, African American male Skin: some dry skin, no worrisome lesions today HEENT: normocephalic, conjunctiva/corneas normal, sclerae anicteric, PERRLA, EOMi, nares patent, no discharge or erythema, pharynx normal Oral cavity: MMM, tongue normal, teeth in good repair Neck: supple, no lymphadenopathy, no thyromegaly, no masses, normal ROM, no bruits Chest: non tender, normal shape and expansion Heart: RRR, normal S1, S2, no murmurs Lungs: CTA bilaterally, no wheezes, rhonchi, or rales Abdomen: +bs, soft, non tender, non distended, no masses, no hepatomegaly, no splenomegaly, no bruits Back: non tender, normal ROM, no  scoliosis Musculoskeletal: upper extremities non tender, no obvious deformity, normal ROM throughout, lower extremities non tender, no obvious deformity, normal ROM throughout Extremities: no edema, no cyanosis, no clubbing Pulses: 2+ symmetric, upper and lower extremities, normal cap refill Neurological: alert, oriented x 3, CN2-12 intact, strength normal upper extremities and lower extremities, sensation normal throughout, DTRs 2+ throughout, no cerebellar signs, gait normal Psychiatric: normal affect, behavior normal, pleasant  GU: normal male external genitalia,circumcised, nontender, no masses, no hernia, no lymphadenopathy Rectal: deferred    Assessment and Plan :   Encounter Diagnoses  Name Primary?  . Encounter for health maintenance examination in adult Yes  . Screening for diabetes mellitus   . Screening for lipid disorders   . BMI 35.0-35.9,adult   . Gastroesophageal reflux disease without esophagitis   . Attention deficit disorder, unspecified hyperactivity presence   . Generalized anxiety disorder   . 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    Health screening: See your eye doctor yearly for routine vision care. See your dentist yearly for routine dental care including hygiene visits twice yearly.  Discussed STD testing, discussed prevention, condom use, means of transmission.  He declines testing today  Cancer screening Advised monthly self testicular exam  Vaccinations: Up to date on flu and Td vaccines   Separate significant chronic issues discussed: C/t follow up with psychiatry  Obese, borderline HTN - counseled on DASH diet, need for weight loss.  Mykelti was seen today for annual exam.  Diagnoses and all orders for  this visit:  Encounter for health maintenance examination in adult -     Comprehensive metabolic panel -     CBC with Differential -     Lipid Panel -     TSH regular -     Hemoglobin A1c  Screening for diabetes mellitus -     Hemoglobin A1c  Screening for lipid disorders -     Lipid Panel  BMI 35.0-35.9,adult  Gastroesophageal reflux disease without esophagitis  Attention deficit disorder, unspecified hyperactivity presence  Generalized anxiety disorder  Depression, major, single episode, in partial remission ([place])    Follow-up pending labs, yearly for physical

## 2019-05-07 NOTE — Patient Instructions (Signed)
Thanks for trusting [place] with your health care and for coming in for a physical today.  Below are some general recommendations I have for you:  Yearly screenings See your eye doctor yearly for routine vision care. See your dentist yearly for routine dental care including hygiene visits twice yearly. See me here yearly for a routine physical and preventative care visit   Specific Concerns today:  . Your blood pressure is a little borderline . Read over the [facility] plan below . We will call with lab results    [place] stands for "Dietary Approaches to Stop Hypertension." The DASH eating plan is a healthy eating plan that has been shown to reduce high blood pressure (hypertension). It may also reduce your risk for type 2 diabetes, heart disease, and stroke. The DASH eating plan may also help with weight loss. What are tips for following this plan?  General guidelines  Avoid eating more than 2,300 mg (milligrams) of salt (sodium) a day. If you have hypertension, you may need to reduce your sodium intake to 1,500 mg a day.  Limit alcohol intake to no more than 1 drink a day for nonpregnant women and 2 drinks a day for men. One drink equals 12 oz of beer, 5 oz of wine, or 1 oz of hard liquor.  Work with your health care provider to maintain a healthy body weight or to lose weight. Ask what an ideal weight is for you.  Get at least 30 minutes of exercise that causes your heart to beat faster (aerobic exercise) most days of the week. Activities may include walking, swimming, or biking.  Work with your health care provider or diet and nutrition specialist (dietitian) to adjust your eating plan to your individual calorie needs. Reading food labels   Check food labels for the amount of sodium per serving. Choose foods with less than 5 percent of the Daily Value of sodium. Generally, foods with less than 300 mg of sodium per serving fit into this eating plan.  To find whole grains,  look for the word "whole" as the first word in the ingredient list. Shopping  Buy products labeled as "low-sodium" or "no salt added."  Buy fresh foods. Avoid canned foods and premade or frozen meals. Cooking  Avoid adding salt when cooking. Use salt-free seasonings or herbs instead of table salt or sea salt. Check with your health care provider or pharmacist before using salt substitutes.  Do not fry foods. Cook foods using healthy methods such as baking, boiling, grilling, and broiling instead.  Cook with heart-healthy oils, such as olive, canola, soybean, or sunflower oil. Meal planning  Eat a balanced diet that includes: ? 5 or more servings of fruits and vegetables each day. At each meal, try to fill half of your plate with fruits and vegetables. ? Up to 6-8 servings of whole grains each day. ? Less than 6 oz of lean meat, poultry, or fish each day. A 3-oz serving of meat is about the same size as a deck of cards. One egg equals 1 oz. ? 2 servings of low-fat dairy each day. ? A serving of nuts, seeds, or beans 5 times each week. ? Heart-healthy fats. Healthy fats called Omega-3 fatty acids are found in foods such as flaxseeds and coldwater fish, like sardines, salmon, and mackerel.  Limit how much you eat of the following: ? Canned or prepackaged foods. ? Food that is high in trans fat, such as fried foods. ?  Food that is high in saturated fat, such as fatty meat. ? Sweets, desserts, sugary drinks, and other foods with added sugar. ? Full-fat dairy products.  Do not salt foods before eating.  Try to eat at least 2 vegetarian meals each week.  Eat more home-cooked food and less restaurant, buffet, and fast food.  When eating at a restaurant, ask that your food be prepared with less salt or no salt, if possible. What foods are recommended? The items listed may not be a complete list. Talk with your dietitian about what dietary choices are best for you. Grains Whole-grain or  whole-wheat bread. Whole-grain or whole-wheat pasta. Brown rice. [place]. Bulgur. Whole-grain and low-sodium cereals. Pita bread. Low-fat, low-sodium crackers. Whole-wheat flour tortillas. Vegetables Fresh or frozen vegetables (raw, steamed, roasted, or grilled). Low-sodium or reduced-sodium tomato and vegetable juice. Low-sodium or reduced-sodium tomato sauce and tomato paste. Low-sodium or reduced-sodium canned vegetables. Fruits All fresh, dried, or frozen fruit. Canned fruit in natural juice (without added sugar). Meat and other protein foods Skinless chicken or [place]. Ground chicken or [place]. Pork with fat trimmed off. Fish and seafood. Egg whites. Dried beans, peas, or lentils. Unsalted nuts, nut butters, and seeds. Unsalted canned beans. Lean cuts of beef with fat trimmed off. Low-sodium, lean deli meat. Dairy Low-fat (1%) or fat-free (skim) milk. Fat-free, low-fat, or reduced-fat cheeses. Nonfat, low-sodium ricotta or cottage cheese. Low-fat or nonfat yogurt. Low-fat, low-sodium cheese. Fats and oils Soft margarine without trans fats. Vegetable oil. Low-fat, reduced-fat, or light mayonnaise and salad dressings (reduced-sodium). Canola, safflower, olive, soybean, and sunflower oils. Avocado. Seasoning and other foods Herbs. Spices. Seasoning mixes without salt. Unsalted popcorn and pretzels. Fat-free sweets. What foods are not recommended? The items listed may not be a complete list. Talk with your dietitian about what dietary choices are best for you. Grains Baked goods made with fat, such as croissants, muffins, or some breads. Dry pasta or rice meal packs. Vegetables Creamed or fried vegetables. Vegetables in a cheese sauce. Regular canned vegetables (not low-sodium or reduced-sodium). Regular canned tomato sauce and paste (not low-sodium or reduced-sodium). Regular tomato and vegetable juice (not low-sodium or reduced-sodium). Angie Fava. Olives. Fruits Canned fruit in a light  or heavy syrup. Fried fruit. Fruit in cream or butter sauce. Meat and other protein foods Fatty cuts of meat. Ribs. Fried meat. Berniece Salines. Sausage. Bologna and other processed lunch meats. Salami. Fatback. Hotdogs. Bratwurst. Salted nuts and seeds. Canned beans with added salt. Canned or smoked fish. Whole eggs or egg yolks. Chicken or [place] with skin. Dairy Whole or 2% milk, cream, and half-and-half. Whole or full-fat cream cheese. Whole-fat or sweetened yogurt. Full-fat cheese. Nondairy creamers. Whipped toppings. Processed cheese and cheese spreads. Fats and oils Butter. Stick margarine. Lard. Shortening. Ghee. Bacon fat. Tropical oils, such as coconut, palm kernel, or palm oil. Seasoning and other foods Salted popcorn and pretzels. Onion salt, garlic salt, seasoned salt, table salt, and sea salt. Worcestershire sauce. Tartar sauce. Barbecue sauce. Teriyaki sauce. Soy sauce, including reduced-sodium. Steak sauce. Canned and packaged gravies. Fish sauce. Oyster sauce. Cocktail sauce. Horseradish that you find on the shelf. Ketchup. Mustard. Meat flavorings and tenderizers. Bouillon cubes. Hot sauce and Tabasco sauce. Premade or packaged marinades. Premade or packaged taco seasonings. Relishes. Regular salad dressings. Where to find more information:  National Heart, Lung, and [place]: https://wilson-eaton.com/  American Heart Association: www.heart.org Summary  The DASH eating plan is a healthy eating plan that has been shown to reduce high blood  pressure (hypertension). It may also reduce your risk for type 2 diabetes, heart disease, and stroke.  With the DASH eating plan, you should limit salt (sodium) intake to 2,300 mg a day. If you have hypertension, you may need to reduce your sodium intake to 1,500 mg a day.  When on the DASH eating plan, aim to eat more fresh fruits and vegetables, whole grains, lean proteins, low-fat dairy, and heart-healthy fats.  Work with your health care provider or  diet and nutrition specialist (dietitian) to adjust your eating plan to your individual calorie needs. This information is not intended to replace advice given to you by your health care provider. Make sure you discuss any questions you have with your health care provider. Document Released: 04/25/2011 Document Revised: 04/18/2017 Document Reviewed: 04/29/2016 Elsevier Patient Education  [place].    Please follow up yearly for a physical.   Preventative Care for Adults - Male      [place]:  A routine yearly physical is a good way to check in with your primary care provider about your health and preventive screening. It is also an opportunity to share updates about your health and any concerns you have, and receive a thorough all-over exam.   Most health insurance companies pay for at least some preventative services.  Check with your health plan for specific coverages.  WHAT PREVENTATIVE SERVICES DO MEN NEED?  Adult men should have their weight and blood pressure checked regularly.   Men age 48 and older should have their cholesterol levels checked regularly.  Beginning at age 51 and continuing to age 78, men should be screened for colorectal cancer.  Certain people may need continued testing until age 18.  Updating vaccinations is part of preventative care.  Vaccinations help protect against diseases such as the flu.  Osteoporosis is a disease in which the bones lose minerals and strength as we age. Men ages 66 and over should discuss this with their caregivers  Lab tests are generally done as part of preventative care to screen for anemia and blood disorders, to screen for problems with the kidneys and liver, to screen for bladder problems, to check blood sugar, and to check your cholesterol level.  Preventative services generally include counseling about diet, exercise, avoiding tobacco, drugs, excessive alcohol consumption, and sexually transmitted  infections.    GENERAL RECOMMENDATIONS FOR GOOD HEALTH:  Healthy diet:  Eat a variety of foods, including fruit, vegetables, animal or vegetable protein, such as meat, fish, chicken, and eggs, or beans, lentils, tofu, and grains, such as rice.  Drink plenty of water daily.  Decrease saturated fat in the diet, avoid lots of red meat, processed foods, sweets, fast foods, and fried foods.  Exercise:  Aerobic exercise helps maintain good heart health. At least 30-40 minutes of moderate-intensity exercise is recommended. For example, a brisk walk that increases your heart rate and breathing. This should be done on most days of the week.   Find a type of exercise or a variety of exercises that you enjoy so that it becomes a part of your daily life.  Examples are running, walking, swimming, water aerobics, and biking.  For motivation and support, explore group exercise such as aerobic class, spin class, Zumba, Yoga,or  martial arts, etc.    Set exercise goals for yourself, such as a certain weight goal, walk or run in a race such as a 5k walk/run.  Speak to your primary care provider about exercise  goals.  Disease prevention:  If you smoke or chew tobacco, find out from your caregiver how to quit. It can literally save your life, no matter how long you have been a tobacco user. If you do not use tobacco, never begin.   Maintain a healthy diet and normal weight. Increased weight leads to problems with blood pressure and diabetes.   The Body Mass Index or BMI is a way of measuring how much of your body is fat. Having a BMI above 27 increases the risk of heart disease, diabetes, hypertension, stroke and other problems related to obesity. Your caregiver can help determine your BMI and based on it develop an exercise and dietary program to help you achieve or maintain this important measurement at a healthful level.  High blood pressure causes heart and blood vessel problems.  Persistent high blood  pressure should be treated with medicine if weight loss and exercise do not work.   Fat and cholesterol leaves deposits in your arteries that can block them. This causes heart disease and vessel disease elsewhere in your body.  If your cholesterol is found to be high, or if you have heart disease or certain other medical conditions, then you may need to have your cholesterol monitored frequently and be treated with medication.   Ask if you should have a cardiac stress test if your history suggests this. A stress test is a test done on a treadmill that looks for heart disease. This test can find disease prior to there being a problem.  Osteoporosis is a disease in which the bones lose minerals and strength as we age. This can result in serious bone fractures. Risk of osteoporosis can be identified using a bone density scan. Men ages 30 and over should discuss this with their caregivers. Ask your caregiver whether you should be taking a calcium supplement and Vitamin D, to reduce the rate of osteoporosis.   Avoid drinking alcohol in excess (more than two drinks per day).  Avoid use of street drugs. Do not share needles with anyone. Ask for professional help if you need assistance or instructions on stopping the use of alcohol, cigarettes, and/or drugs.  Brush your teeth twice a day with fluoride toothpaste, and floss once a day. Good oral hygiene prevents tooth decay and gum disease. The problems can be painful, unattractive, and can cause other health problems. Visit your dentist for a routine oral and dental check up and preventive care every 6-12 months.   Look at your skin regularly.  Use a mirror to look at your back. Notify your caregivers of changes in moles, especially if there are changes in shapes, colors, a size larger than a pencil eraser, an irregular border, or development of new moles.  Safety:  Use seatbelts 100% of the time, whether driving or as a passenger.  Use safety devices such  as hearing protection if you work in environments with loud noise or significant background noise.  Use safety glasses when doing any work that could send debris in to the eyes.  Use a helmet if you ride a bike or motorcycle.  Use appropriate safety gear for contact sports.  Talk to your caregiver about gun safety.  Use sunscreen with a SPF (or skin protection factor) of 15 or greater.  Lighter skinned people are at a greater risk of skin cancer. Don't forget to also wear sunglasses in order to protect your eyes from too much damaging sunlight. Damaging sunlight can accelerate cataract formation.  Practice safe sex. Use condoms. Condoms are used for birth control and to help reduce the spread of sexually transmitted infections (or STIs).  Some of the STIs are gonorrhea (the clap), chlamydia, syphilis, trichomonas, herpes, HPV (human papilloma virus) and HIV (human immunodeficiency virus) which causes AIDS. The herpes, HIV and HPV are viral illnesses that have no cure. These can result in disability, cancer and death.   Keep carbon monoxide and smoke detectors in your home functioning at all times. Change the batteries every 6 months or use a model that plugs into the wall.   Vaccinations:  You are up to date on vaccines   What should I know about cancer screening? Many types of cancers can be detected early and may often be prevented. Lung Cancer  You should be screened every year for lung cancer if: ? You are a current smoker who has smoked for at least 30 years. ? You are a former smoker who has quit within the past 15 years.  Talk to your health care provider about your screening options, when you should start screening, and how often you should be screened.  Colorectal Cancer  Routine colorectal cancer screening usually begins at 32 years of age and should be repeated every 5-10 years until you are 32 years old. You may need to be screened more often if early forms of precancerous  polyps or small growths are found. Your health care provider may recommend screening at an earlier age if you have risk factors for colon cancer.  Your health care provider may recommend using home test kits to check for hidden blood in the stool.  A small camera at the end of a tube can be used to examine your colon (sigmoidoscopy or colonoscopy). This checks for the earliest forms of colorectal cancer.  Prostate and Testicular Cancer  Depending on your age and overall health, your health care provider may do certain tests to screen for prostate and testicular cancer.  Talk to your health care provider about any symptoms or concerns you have about testicular or prostate cancer.  Skin Cancer  Check your skin from head to toe regularly.  Tell your health care provider about any new moles or changes in moles, especially if: ? There is a change in a mole's size, shape, or color. ? You have a mole that is larger than a pencil eraser.  Always use sunscreen. Apply sunscreen liberally and repeat throughout the day.  Protect yourself by wearing long sleeves, pants, a wide-brimmed hat, and sunglasses when outside.

## 2019-05-08 LAB — COMPREHENSIVE METABOLIC PANEL
ALT: 51 IU/L — ABNORMAL HIGH (ref 0–44)
AST: 26 IU/L (ref 0–40)
Albumin/Globulin Ratio: 1.8 (ref 1.2–2.2)
Albumin: 4.7 g/dL (ref 4.0–5.0)
Alkaline Phosphatase: 53 IU/L (ref 39–117)
BUN/Creatinine Ratio: 9 (ref 9–20)
BUN: 10 mg/dL (ref 6–20)
Bilirubin Total: 0.4 mg/dL (ref 0.0–1.2)
CO2: 24 mmol/L (ref 20–29)
Calcium: 9.9 mg/dL (ref 8.7–10.2)
Chloride: 101 mmol/L (ref 96–106)
Creatinine, Ser: 1.16 mg/dL (ref 0.76–1.27)
GFR calc Af Amer: 96 mL/min/{1.73_m2} (ref >59)
GFR calc non Af Amer: 83 mL/min/{1.73_m2} (ref >59)
Globulin, Total: 2.6 g/dL (ref 1.5–4.5)
Glucose: 103 mg/dL — ABNORMAL HIGH (ref 65–99)
Potassium: 4.7 mmol/L (ref 3.5–5.2)
Sodium: 141 mmol/L (ref 134–144)
Total Protein: 7.3 g/dL (ref 6.0–8.5)

## 2019-05-08 LAB — CBC WITH DIFFERENTIAL/PLATELET
Basophils Absolute: 0.1 10*3/uL (ref 0.0–0.2)
Basos: 1 %
EOS (ABSOLUTE): 0.1 10*3/uL (ref 0.0–0.4)
Eos: 2 %
Hematocrit: 48.3 % (ref 37.5–51.0)
Hemoglobin: 16.5 g/dL (ref 13.0–17.7)
Immature Grans (Abs): 0 10*3/uL (ref 0.0–0.1)
Immature Granulocytes: 0 %
Lymphocytes Absolute: 3.3 10*3/uL — ABNORMAL HIGH (ref 0.7–3.1)
Lymphs: 60 %
MCH: 27.6 pg (ref 26.6–33.0)
MCHC: 34.2 g/dL (ref 31.5–35.7)
MCV: 81 fL (ref 79–97)
Monocytes Absolute: 0.5 10*3/uL (ref 0.1–0.9)
Monocytes: 9 %
Neutrophils Absolute: 1.5 10*3/uL (ref 1.4–7.0)
Neutrophils: 28 %
Platelets: 315 10*3/uL (ref 150–450)
RBC: 5.97 x10E6/uL — ABNORMAL HIGH (ref 4.14–5.80)
RDW: 12.3 % (ref 11.6–15.4)
WBC: 5.4 10*3/uL (ref 3.4–10.8)

## 2019-05-08 LAB — TSH: TSH: 0.644 u[IU]/mL (ref 0.450–4.500)

## 2019-05-08 LAB — LIPID PANEL
Chol/HDL Ratio: 3.3 ratio (ref 0.0–5.0)
Cholesterol, Total: 203 mg/dL — ABNORMAL HIGH (ref 100–199)
HDL: 61 mg/dL (ref >39)
LDL Chol Calc (NIH): 123 mg/dL — ABNORMAL HIGH (ref 0–99)
Triglycerides: 104 mg/dL (ref 0–149)
VLDL Cholesterol Cal: 19 mg/dL (ref 5–40)

## 2019-05-08 LAB — HEMOGLOBIN A1C
Est. average glucose Bld gHb Est-mCnc: 105 mg/dL
Hgb A1c MFr Bld: 5.3 % (ref 4.8–5.6)

## 2019-05-11 ENCOUNTER — Encounter: Payer: Self-pay | Admitting: Medical

## 2019-05-17 ENCOUNTER — Ambulatory Visit: Payer: Medicaid Other | Admitting: Medical

## 2019-05-17 ENCOUNTER — Encounter: Payer: Self-pay | Admitting: Medical

## 2019-05-17 ENCOUNTER — Other Ambulatory Visit: Payer: Self-pay

## 2019-05-17 VITALS — Ht 73.0 in | Wt 255.0 lb

## 2019-05-17 DIAGNOSIS — R05 Cough: Secondary | ICD-10-CM

## 2019-05-17 DIAGNOSIS — R059 Cough, unspecified: Secondary | ICD-10-CM

## 2019-05-17 DIAGNOSIS — R112 Nausea with vomiting, unspecified: Secondary | ICD-10-CM | POA: Diagnosis not present

## 2019-05-17 DIAGNOSIS — R52 Pain, unspecified: Secondary | ICD-10-CM | POA: Diagnosis not present

## 2019-05-17 MED ORDER — EMERGEN-C IMMUNE PLUS PO PACK: 1.0000 | PACK | Freq: Two times a day (BID) | ORAL | 0 refills | Status: DC

## 2019-05-17 MED ORDER — PROMETHAZINE-DM 6.25-15 MG/5ML PO SYRP: 5.0000 mL | ORAL_SOLUTION | Freq: Four times a day (QID) | ORAL | 0 refills | Status: DC | PRN

## 2019-05-17 NOTE — Patient Instructions (Signed)
Your symptoms and exam today suggests possible Covid infection or other viral infection.    Symptoms can include fever, tiredness, body aches, cough, sore throat, diarrhea, headache, loss of taste or smell, shortness of breath, rash, and discoloration of fingers or toes.    Risk factors that may put someone at risk for worse outcome with covid infection includes older than 32 years old, underlying health conditions like COPD, heart failure, asthma, diabetes, hypertension, kidney disease, obesity, and history of heart disease or stroke.  Currently your symptoms seem mild.     General recommendations: I recommend you rest, hydrate well with water and clear fluids throughout the day.   Drink enough water and clear fluids so that your urine is clear.   You can use Tylenol for pain or fever every 4-6 hours. You can use the prescription Promethazine DM sent to the pharmacy for cough, nausea and vomiting as directed on the label. Consider using over the counter Emergen-C Immune + over the counter for the next week.  If you need any medications from the pharmacy, have a friend or family member pick them up from the pharmacy for you.  Have them drop off the medications at your home to keep you from having to go into the pharmacy and potentially exposure others.   If this is not possible, see if your pharmacy does home delivery.  Or worse case scenario, go through the drive through at your pharmacy, wear your mask, use hand sanitizer before touching anything in the drive through transaction, and limit interaction with the store personally, particularly staying > 6 feet apart.  Over the next few days if you are having worse trouble breathing, if you are very weak, have persistent fever 101 or higher consistently despite Tylenol, uncontrollable nausea and vomiting, or feel very dehydrated, then call or go to the emergency department.    If you have other questions or have other symptoms or questions you are  concerned about then please call back.  Covid symptoms such as fatigue and cough can linger over 2 weeks, even after the initial fever, aches, chills, and other initial symptoms.   Covid testing:  [place] testing: Schedule your test at one of the [place] testing sites online or by text:  Online: Go to [email or web address].com.pt   or   ToolingNews.es, and schedule appointment or review other testing information on the web page  Text: Text the word "COVID" to [id number]       Self Quarantine: The CDC, Centers for Disease Control has recommended a self quarantine of 10 -14 days from the start of your illness until you are symptom-free including at least 24 hours of no symptoms including no fever, no shortness of breath, and no body aches and chills, by day 10 before returning to work or general contact with the public.  What does self quarantine mean: avoiding contact with people as much as possible.   Particularly in your house, isolate your self from others in a separate room, wear a mask when possible in the room, particularly if coughing a lot.   Have others bring food, water, medications, etc., to your door, but avoid direct contact with your household contacts during this time to avoid spreading the infection to them.   If you have a separate bathroom and living quarters during the next 2 weeks away from others, that would be preferable.    If you can't completely isolate, then wear a mask, wash hands frequently with soap and  water for at least 15 seconds, minimize close contact with others, and have a friend or family member check regularly from a distance to make sure you are not getting seriously worse.     You should not be going out in public, should not be going to stores, to work or other public places until all your symptoms have resolved and at least 10 days + 24 hours of no symptoms at all have transpired.   Ideally you should avoid contact with others for  a full 10 days if possible.  One of the goals is to limit spread to high risk people; people that are older and elderly, people with multiple health issues like diabetes, heart disease, lung disease, and anybody that has weakened immune systems such as people with cancer or on immunosuppressive therapy.

## 2019-05-17 NOTE — Progress Notes (Signed)
Subjective:     Patient ID: [name], male   DOB: 1985-09-04, 33 y.o.   MRN: [id number]  This visit type was conducted due to national recommendations for restrictions regarding the COVID-19 Pandemic (e.g. social distancing) in an effort to limit this patient's exposure and mitigate transmission in our community.  Due to their co-morbid illnesses, this patient is at least at moderate risk for complications without adequate follow up.  This format is felt to be most appropriate for this patient at this time.    Documentation for virtual audio and video telecommunications through Zoom encounter:  The patient was located at home. The provider was located in the office. The patient did consent to this visit and is aware of possible charges through their insurance for this visit.  The other persons participating in this telemedicine service were none. Time spent on call was 20 minutes and in review of previous records 20 minutes total.  This virtual service is not related to other E/M service within previous 7 days.   HPI Chief Complaint  Patient presents with  . Abdominal Pain    diarrhea, burning sensation in chest and stomach    Virtual consult for not feeling well.   Started a few days ago with body feeling on fire, stomach hurts, feels weak, had an episode of vomiting a few times yesterday and Christmas eve.   He notes body aches, some chills.   Felt hot, but didn't check temp.  So he thinks he has had fever.  Has had some cough, intermittent cough, sneezing, but no runny nose.  Has mild sore throat.   Has had some intermittent headaches.   He notes some loose stool intermittent in recent day.   Using nothing for symptoms.   No sick contacts.    Review of Systems As in subjective    Objective:   Physical Exam Due to coronavirus pandemic stay at home measures, patient visit was virtual and they were not examined in person.   Ht 6\' 1"  (1.854 m)   Wt 255 lb (115.7 kg)   BMI  33.64 kg/m       Assessment:     Encounter Diagnoses  Name Primary?  . Cough Yes  . Body aches   . Nausea and vomiting, intractability of vomiting not specified, unspecified vomiting type        Plan:     Your symptoms and exam today suggests possible Covid infection or other viral infection.    Symptoms can include fever, tiredness, body aches, cough, sore throat, diarrhea, headache, loss of taste or smell, shortness of breath, rash, and discoloration of fingers or toes.    Risk factors that may put someone at risk for worse outcome with covid infection includes older than 32 years old, underlying health conditions like COPD, heart failure, asthma, diabetes, hypertension, kidney disease, obesity, and history of heart disease or stroke.  Currently your symptoms seem mild.     General recommendations: I recommend you rest, hydrate well with water and clear fluids throughout the day.   Drink enough water and clear fluids so that your urine is clear.   You can use Tylenol for pain or fever every 4-6 hours. You can use the prescription Promethazine DM sent to the pharmacy for cough, nausea and vomiting as directed on the label. Consider using over the counter Emergen-C Immune + over the counter for the next week.  If you need any medications from the pharmacy, have a friend  or family member pick them up from the pharmacy for you.  Have them drop off the medications at your home to keep you from having to go into the pharmacy and potentially exposure others.   If this is not possible, see if your pharmacy does home delivery.  Or worse case scenario, go through the drive through at your pharmacy, wear your mask, use hand sanitizer before touching anything in the drive through transaction, and limit interaction with the store personally, particularly staying > 6 feet apart.  Over the next few days if you are having worse trouble breathing, if you are very weak, have persistent fever 101 or  higher consistently despite Tylenol, uncontrollable nausea and vomiting, or feel very dehydrated, then call or go to the emergency department.    If you have other questions or have other symptoms or questions you are concerned about then please call back.  Covid symptoms such as fatigue and cough can linger over 2 weeks, even after the initial fever, aches, chills, and other initial symptoms.   Covid testing:  [facility] testing: Schedule your test at one of the [facility] testing sites online or by text:  Online: Go to [employer].com.pt   or   [employer].es, and schedule appointment or review other testing information on the web page  Text: Text the word "COVID" to [id number]       Self Quarantine: The CDC, Centers for Disease Control has recommended a self quarantine of 10 -14 days from the start of your illness until you are symptom-free including at least 24 hours of no symptoms including no fever, no shortness of breath, and no body aches and chills, by day 10 before returning to work or general contact with the public.  What does self quarantine mean: avoiding contact with people as much as possible.   Particularly in your house, isolate your self from others in a separate room, wear a mask when possible in the room, particularly if coughing a lot.   Have others bring food, water, medications, etc., to your door, but avoid direct contact with your household contacts during this time to avoid spreading the infection to them.   If you have a separate bathroom and living quarters during the next 2 weeks away from others, that would be preferable.    If you can't completely isolate, then wear a mask, wash hands frequently with soap and water for at least 15 seconds, minimize close contact with others, and have a friend or family member check regularly from a distance to make sure you are not getting seriously worse.     You should not be going out in public,  should not be going to stores, to work or other public places until all your symptoms have resolved and at least 10 days + 24 hours of no symptoms at all have transpired.   Ideally you should avoid contact with others for a full 10 days if possible.  One of the goals is to limit spread to high risk people; people that are older and elderly, people with multiple health issues like diabetes, heart disease, lung disease, and anybody that has weakened immune systems such as people with cancer or on immunosuppressive therapy.    Husain was seen today for abdominal pain.  Diagnoses and all orders for this visit:  Cough  Body aches  Nausea and vomiting, intractability of vomiting not specified, unspecified vomiting type  Other orders -     promethazine-dextromethorphan (PROMETHAZINE-DM) 6.25-15 MG/5ML  syrup; Take 5 mLs by mouth 4 (four) times daily as needed for cough. -     Multiple Vitamins-Minerals (EMERGEN-C IMMUNE PLUS) PACK; Take 1 tablet by mouth 2 (two) times daily.

## 2019-05-19 ENCOUNTER — Other Ambulatory Visit: Payer: Self-pay

## 2019-05-19 ENCOUNTER — Ambulatory Visit (HOSPITAL_COMMUNITY): Payer: Medicaid Other | Admitting: Psychiatry

## 2019-05-19 ENCOUNTER — Emergency Department (HOSPITAL_BASED_OUTPATIENT_CLINIC_OR_DEPARTMENT_OTHER)
Admission: EM | Admit: 2019-05-19 | Discharge: 2019-05-19 | Disposition: A | Discharge status: Home / Self Care | Payer: Medicaid Other | Attending: Emergency Medicine | Admitting: Emergency Medicine

## 2019-05-19 ENCOUNTER — Encounter (HOSPITAL_BASED_OUTPATIENT_CLINIC_OR_DEPARTMENT_OTHER): Payer: Self-pay | Admitting: Emergency Medicine

## 2019-05-19 ENCOUNTER — Emergency Department (HOSPITAL_BASED_OUTPATIENT_CLINIC_OR_DEPARTMENT_OTHER): Payer: Medicaid Other

## 2019-05-19 DIAGNOSIS — R0602 Shortness of breath: Secondary | ICD-10-CM | POA: Insufficient documentation

## 2019-05-19 DIAGNOSIS — R197 Diarrhea, unspecified: Secondary | ICD-10-CM | POA: Diagnosis not present

## 2019-05-19 DIAGNOSIS — B349 Viral infection, unspecified: Secondary | ICD-10-CM | POA: Diagnosis not present

## 2019-05-19 DIAGNOSIS — R519 Headache, unspecified: Secondary | ICD-10-CM | POA: Insufficient documentation

## 2019-05-19 DIAGNOSIS — R5381 Other malaise: Secondary | ICD-10-CM | POA: Insufficient documentation

## 2019-05-19 DIAGNOSIS — Z79899 Other long term (current) drug therapy: Secondary | ICD-10-CM | POA: Diagnosis not present

## 2019-05-19 DIAGNOSIS — Z20828 Contact with and (suspected) exposure to other viral communicable diseases: Secondary | ICD-10-CM | POA: Diagnosis not present

## 2019-05-19 DIAGNOSIS — R05 Cough: Secondary | ICD-10-CM | POA: Diagnosis not present

## 2019-05-19 DIAGNOSIS — R112 Nausea with vomiting, unspecified: Secondary | ICD-10-CM | POA: Diagnosis present

## 2019-05-19 LAB — SARS CORONAVIRUS 2 AG (30 MIN TAT): SARS Coronavirus 2 Ag: NEGATIVE

## 2019-05-19 LAB — COMPREHENSIVE METABOLIC PANEL
ALT: 27 U/L (ref 0–44)
AST: 21 U/L (ref 15–41)
Albumin: 4.6 g/dL (ref 3.5–5.0)
Alkaline Phosphatase: 37 U/L — ABNORMAL LOW (ref 38–126)
Anion gap: 11 (ref 5–15)
BUN: 15 mg/dL (ref 6–20)
CO2: 22 mmol/L (ref 22–32)
Calcium: 9.5 mg/dL (ref 8.9–10.3)
Chloride: 103 mmol/L (ref 98–111)
Creatinine, Ser: 1.31 mg/dL — ABNORMAL HIGH (ref 0.61–1.24)
GFR calc Af Amer: >60 mL/min (ref >60)
GFR calc non Af Amer: >60 mL/min (ref >60)
Glucose, Bld: 90 mg/dL (ref 70–99)
Potassium: 3.8 mmol/L (ref 3.5–5.1)
Sodium: 136 mmol/L (ref 135–145)
Total Bilirubin: 1 mg/dL (ref 0.3–1.2)
Total Protein: 8.1 g/dL (ref 6.5–8.1)

## 2019-05-19 LAB — CBC WITH DIFFERENTIAL/PLATELET
Abs Immature Granulocytes: 0.05 10*3/uL (ref 0.00–0.07)
Basophils Absolute: 0 10*3/uL (ref 0.0–0.1)
Basophils Relative: 1 %
Eosinophils Absolute: 0 10*3/uL (ref 0.0–0.5)
Eosinophils Relative: 1 %
HCT: 51.4 % (ref 39.0–52.0)
Hemoglobin: 17 g/dL (ref 13.0–17.0)
Immature Granulocytes: 1 %
Lymphocytes Relative: 43 %
Lymphs Abs: 2.6 10*3/uL (ref 0.7–4.0)
MCH: 27.7 pg (ref 26.0–34.0)
MCHC: 33.1 g/dL (ref 30.0–36.0)
MCV: 83.8 fL (ref 80.0–100.0)
Monocytes Absolute: 0.5 10*3/uL (ref 0.1–1.0)
Monocytes Relative: 9 %
Neutro Abs: 2.8 10*3/uL (ref 1.7–7.7)
Neutrophils Relative %: 45 %
Platelets: 339 10*3/uL (ref 150–400)
RBC: 6.13 MIL/uL — ABNORMAL HIGH (ref 4.22–5.81)
RDW: 12.8 % (ref 11.5–15.5)
WBC: 6.1 10*3/uL (ref 4.0–10.5)
nRBC: 0 % (ref 0.0–0.2)

## 2019-05-19 LAB — RESPIRATORY PANEL BY RT PCR (FLU A&B, COVID)
Influenza A by PCR: NEGATIVE
Influenza B by PCR: NEGATIVE
SARS Coronavirus 2 by RT PCR: NEGATIVE

## 2019-05-19 MED ORDER — ONDANSETRON 4 MG PO TBDP: 4.0000 mg | ORAL_TABLET | Freq: Three times a day (TID) | ORAL | 0 refills | Status: DC | PRN

## 2019-05-19 MED ORDER — LORAZEPAM 2 MG/ML IJ SOLN
0.5000 mg | Freq: Once | INTRAMUSCULAR | Status: AC
  Administered 2019-05-19: 0.5 mg via INTRAVENOUS
  Filled 2019-05-19: qty 1

## 2019-05-19 MED ORDER — PROCHLORPERAZINE EDISYLATE 10 MG/2ML IJ SOLN
10.0000 mg | Freq: Once | INTRAMUSCULAR | Status: AC
  Administered 2019-05-19: 14:00:00 10 mg via INTRAVENOUS
  Filled 2019-05-19: qty 2

## 2019-05-19 MED ORDER — SODIUM CHLORIDE 0.9 % IV BOLUS
1000.0000 mL | Freq: Once | INTRAVENOUS | Status: AC
  Administered 2019-05-19: 14:00:00 1000 mL via INTRAVENOUS

## 2019-05-19 MED FILL — ONDANSETRON ODT 4 MG TABLET: 4 | 5 days supply | Qty: 15 | Fill #0

## 2019-05-19 NOTE — Discharge Instructions (Signed)
Keep yourself hydrated at home.  Make an effort to keep yourself eating regularly.  Your Covid and flu test result should be available by the end of the day early tomorrow.

## 2019-05-19 NOTE — ED Provider Notes (Signed)
[name] EMERGENCY DEPARTMENT Provider Note   CSN: [id number] Arrival date & time: 05/19/19  1221     History Chief Complaint  Patient presents with  . Covid Symptoms    [name] is a 33 y.o. male history of obesity presented to emergency department nausea vomiting diarrhea.  He reports his symptoms began on December 24.  Began having general myalgias.  This was followed by nausea and vomiting.  He has had very poor appetite and feels like his not been able to keep down any food for the past 3 days.  He has been able to drink water.  Describe some loose bowel movements.  He describes some general malaise.  He says feels like he is burning from inside out.  He describes headaches.  Also describes a cough shortness of breath.   NKDA  HPI     Past Medical History:  Diagnosis Date  . ADD (attention deficit disorder)   . Anxiety   . Depression   . Gastritis 03/2015   EGD Dr. [name]  . H/O echocardiogram 10/2007   normal stress echo, Dr. Jenkins Rouge  . Learning disability   . Obesity   . Tinea corporis   . Wears glasses     Patient Active Problem List   Diagnosis Date Noted  . BMI 35.0-35.9,adult 05/07/2019  . 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 Generalized anxiety disorder 04/22/2019  . Depression, major, single episode, in partial remission ([place]) 09/03/2018  . Tinea versicolor 02/22/2016  . Routine general medical examination at a health care facility 07/21/2015  . Gastroesophageal reflux disease without esophagitis 07/21/2015  . Need for prophylactic vaccination and inoculation against influenza 07/21/2015  . Screening for lipid disorders 07/21/2015  . Snoring 07/21/2015  . Non-restorative sleep 07/21/2015  . ADD (attention deficit disorder) 08/11/2014  . Learning disability 08/11/2014  . Obesity 08/11/2014    Past Surgical History:  Procedure Laterality Date  . ESOPHAGOGASTRODUODENOSCOPY  03/2015   gastritis; Dr.  [name]       Family History  Problem Relation Age of Onset  . Hypertension Mother   . Deep vein thrombosis Mother   . Multiple sclerosis Father   . Diabetes Paternal Aunt   . Heart disease Paternal Uncle   . Prostate cancer Paternal Grandfather   . Cancer Paternal Grandmother        uterine  . Stroke Neg Hx     Social History   Tobacco Use  . Smoking status: Never Smoker  . Smokeless tobacco: Never Used  Substance Use Topics  . Alcohol use: No  . Drug use: No    Home Medications Prior to Admission medications   Medication Sig Start Date End Date Taking? Authorizing Provider  amphetamine-dextroamphetamine (ADDERALL) 30 MG tablet Take 1 tablet by mouth 2 (two) times daily. 11/03/18   Tysinger, Camelia Eng, PA-C  ketoconazole (NIZORAL) 2 % shampoo Apply 1 application topically 2 (two) times a week. 02/25/19   Tysinger, Camelia Eng, PA-C  Multiple Vitamins-Minerals (EMERGEN-C IMMUNE PLUS) PACK Take 1 tablet by mouth 2 (two) times daily. 05/17/19   Tysinger, Camelia Eng, PA-C  ondansetron (ZOFRAN ODT) 4 MG disintegrating tablet Take 1 tablet (4 mg total) by mouth every 8 (eight) hours as needed for up to 15 doses for nausea or vomiting. 05/19/19   Jason Dusky, MD  pantoprazole (PROTONIX) 40 MG tablet Take 1 tablet (40 mg total) by mouth daily. 09/03/18   Tysinger, Camelia Eng, PA-C  promethazine-dextromethorphan (PROMETHAZINE-DM) 6.25-15 MG/5ML syrup Take 5 mLs by mouth 4 (four) times daily as needed for cough. 05/17/19   Tysinger, Camelia Eng, PA-C  tretinoin (RETIN-A) 0.05 % cream Apply topically at bedtime. 02/23/19   Tysinger, Camelia Eng, PA-C  ziprasidone (GEODON) 20 MG capsule Take 1 capsule (20 mg total) by mouth 2 (two) times daily with a meal. 04/22/19 05/22/19  Pucilowski, Marchia Bond, MD    Allergies    Grass extracts [gramineae pollens]  Review of Systems   Review of Systems  Constitutional: Positive for activity change, appetite change, chills, fatigue and fever.  HENT: Positive  for congestion and sore throat.   Eyes: Negative for photophobia and visual disturbance.  Respiratory: Positive for cough and shortness of breath.   Cardiovascular: Negative for chest pain and palpitations.  Gastrointestinal: Positive for abdominal pain, diarrhea, nausea and vomiting.  Genitourinary: Negative for dysuria and hematuria.  Musculoskeletal: Positive for arthralgias and myalgias.  Neurological: Positive for light-headedness and headaches. Negative for syncope.  Psychiatric/Behavioral: Negative for agitation and confusion.  All other systems reviewed and are negative.   Physical Exam Updated Vital Signs BP 136/77 (BP Location: Right Arm)   Pulse 68   Temp 98.2 F (36.8 C) (Oral)   Resp 18   Ht 6\' 1"  (1.854 m)   Wt 115.6 kg   SpO2 98%   BMI 33.62 kg/m   Physical Exam Vitals and nursing note reviewed.  Constitutional:      Appearance: He is well-developed.  HENT:     Head: Normocephalic and atraumatic.  Eyes:     Conjunctiva/sclera: Conjunctivae normal.  Cardiovascular:     Rate and Rhythm: Regular rhythm. Tachycardia present.     Pulses: Normal pulses.  Pulmonary:     Effort: Pulmonary effort is normal. No respiratory distress.     Breath sounds: Normal breath sounds.     Comments: 97% on room air Abdominal:     General: There is no distension.     Palpations: Abdomen is soft.     Tenderness: There is no abdominal tenderness. There is no guarding.  Musculoskeletal:     Cervical back: Neck supple.  Skin:    General: Skin is warm and dry.  Neurological:     General: No focal deficit present.     Mental Status: He is alert and oriented to person, place, and time.  Psychiatric:        Mood and Affect: Mood normal.        Behavior: Behavior normal.     ED Results / Procedures / Treatments   Labs (all labs ordered are listed, but only abnormal results are displayed) Labs Reviewed  COMPREHENSIVE METABOLIC PANEL - Abnormal; Notable for the following  components:      Result Value   Creatinine, Ser 1.31 (*)    Alkaline Phosphatase 37 (*)    All other components within normal limits  CBC WITH DIFFERENTIAL/PLATELET - Abnormal; Notable for the following components:   RBC 6.13 (*)    All other components within normal limits  SARS CORONAVIRUS 2 AG (30 MIN TAT)  RESPIRATORY PANEL BY RT PCR (FLU A&B, COVID)    EKG None  Radiology DG Chest Portable 1 View  Result Date: 05/19/2019 CLINICAL DATA:  Cough for several days EXAM: PORTABLE CHEST 1 VIEW COMPARISON:  06/03/2017 FINDINGS: The heart size and mediastinal contours are within normal limits. Both lungs are clear. The visualized skeletal structures are unremarkable. IMPRESSION: No active disease. Electronically Signed  By: Inez Catalina M.D.   On: 05/19/2019 13:57    Procedures Procedures (including critical care time)  Medications Ordered in ED Medications  prochlorperazine (COMPAZINE) injection 10 mg (10 mg Intravenous Given 05/19/19 1404)  sodium chloride 0.9 % bolus 1,000 mL (0 mLs Intravenous Stopped 05/19/19 1509)  LORazepam (ATIVAN) injection 0.5 mg (0.5 mg Intravenous Given 05/19/19 1403)    ED Course  I have reviewed the triage vital signs and the nursing notes.  Pertinent labs & imaging results that were available during my care of the patient were reviewed by me and considered in my medical decision making (see chart for details).  33 yo male presenting with viral syndrome for 5 days, reporting multiple system involvement, GI and muscular, n/v and diarrhea, congestion, poor appetite, headache, myalgias.  Strongly suspicious of covid or influenza.  Rapid covid negative but will send PCR.  CMP with no evidence of significant electrolyte derangement.  Patient reports hx of "some kidney disease" consistent with Cr 1.3,  Advised that he keep himself hydrated  Our medications improved his symptoms and stopped the vomiting.  He'll perform continued supportive care at  home.  Final Clinical Impression(s) / ED Diagnoses Final diagnoses:  Viral illness    Rx / DC Orders ED Discharge Orders         Ordered    ondansetron (ZOFRAN ODT) 4 MG disintegrating tablet  Every 8 hours PRN     05/19/19 1537           Jason Dusky, MD 05/19/19 1911

## 2019-05-19 NOTE — ED Triage Notes (Signed)
Abdominal pain, vomiting, diarrhea and cough since 12/24. Has not been tested for Covid.

## 2019-05-25 ENCOUNTER — Other Ambulatory Visit: Payer: Self-pay

## 2019-05-25 ENCOUNTER — Ambulatory Visit (INDEPENDENT_AMBULATORY_CARE_PROVIDER_SITE_OTHER): Payer: Medicaid Other | Admitting: Psychiatry

## 2019-05-25 DIAGNOSIS — F909 Attention-deficit hyperactivity disorder, unspecified type: Secondary | ICD-10-CM

## 2019-05-25 DIAGNOSIS — F29 Unspecified psychosis not due to a substance or known physiological condition: Secondary | ICD-10-CM

## 2019-05-25 DIAGNOSIS — F060 Psychotic disorder with hallucinations due to known physiological condition: Secondary | ICD-10-CM

## 2019-05-25 DIAGNOSIS — F411 Generalized anxiety disorder: Secondary | ICD-10-CM | POA: Diagnosis not present

## 2019-05-25 MED ORDER — ZIPRASIDONE HCL 40 MG PO CAPS: 40.0000 mg | ORAL_CAPSULE | Freq: Every day | ORAL | 0 refills | Status: DC

## 2019-05-25 NOTE — Progress Notes (Signed)
[place] MD/PA/NP OP Progress Note  05/25/2019 3:10 PM [name]  MRN:  [id number] Interview was conducted by phone and I verified that I was speaking with the correct person using two identifiers. I discussed the limitations of evaluation and management by telemedicine and  the availability of in person appointments. Patient expressed understanding and agreed to proceed.  Chief Complaint: AH.  HPI: 34 yo single AA male who presents with concerns about recurrent perceptual disturbances that worry his new girlfriend as they will often contribute to him talking loudly to self which she perceives as "crazy behavior". [name] describes occasionally hearing male orr male voice which are not saying anything bad about him and are not commanding in nature. He cannot identify any triggers (stress) and he maintains full reality testing ability, I.e. is fully aware that these voices are not real. He had a hx of feeling depressed and in the past having passive suicidal ideation but does not associate AH with worsening depression. He also has a hx of excessive worrying but again does not see a correlation between the two.  He denies having hx of abuse/trauma. He has a hx of learning disability and ADHD and was on and off on stimulants. Again he did not noticed increase in frequency or intensity of AH when he was taking dextroamphetamine. At this time he does not take Adderall or any other psychotropic meds. He has tried risperidone 1 mg bid for a few weeks but did not notice any benefit. At this time he denies feeling particularly depressed or anxious ( except for potential impact his lod talking to self can have on his relationship). He states that he was "a wild child with attitude" but matured. He was in counseling because of that in the past.  His sleep is good and so is appetite. He has no delusions, no problems with concentration or memory. He feels tired and has low motivation, in general. He is not suicidal.  He has no hx of mania (occasional irritability only), alcohol or drug abuse. There is no family psychiatric history to his knowledge.  His symptoms do not meet criteria for schizophrenia - no delusions, no thought or behavioral disorganization, no negative symptoms. No substance abuse or mood disorder component either. He is fully aware that the voices he hears are not real but still tends to "talk with them" - responds. He is interested in trying a medication or two to help them stop. We tried addition of ziprasidone 20 mg bid - he took it bid for less then 2 weeks and stopped after becoming ill with URI. He reports that AM dose made him very tired and he did not notice decline in [name] after that brief period.  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  Past Medical History:  Past Medical History:  Diagnosis Date  . ADD (attention deficit disorder)   . Anxiety   . Depression   . Gastritis 03/2015   EGD Dr. [name]  . H/O echocardiogram 10/2007   normal stress echo, Dr. Jenkins Rouge  . Learning disability   . Obesity   . Tinea corporis   . Wears glasses     Past Surgical History:  Procedure Laterality Date  . ESOPHAGOGASTRODUODENOSCOPY  03/2015   gastritis; Dr. [name]    Family Psychiatric History: None.  Family History:  Family History  Problem Relation Age  of Onset  . Hypertension Mother   . Deep vein thrombosis Mother   . Multiple sclerosis Father   . Diabetes Paternal Aunt   . Heart disease Paternal Uncle   . Prostate cancer Paternal Grandfather   . Cancer Paternal Grandmother        uterine  . Stroke Neg Hx     Social History:  Social History   Socioeconomic History  . Marital status: Single    Spouse name: Not on file  . Number of children: 0  . Years of education: Not on file  . Highest education level: Not on file  Occupational History  . Occupation:  Ship broker  Tobacco Use  . Smoking status: Never Smoker  . Smokeless tobacco: Never Used  Substance and Sexual Activity  . Alcohol use: No  . Drug use: No  . Sexual activity: Yes    Partners: Female  Other Topics Concern  . Not on file  Social History Narrative   Lives with father.   Has degree from [employer] for [employer], does modeling and acting on the side.   Exercise- some weights, walking.  Diet - ok, avoids soda, tries to eat healthy.  04/2019   Social Determinants of Health   Financial Resource Strain:   . Difficulty of Paying Living Expenses: Not on file  Food Insecurity:   . Worried About Charity fundraiser in the Last Year: Not on file  . Ran Out of Food in the Last Year: Not on file  Transportation Needs:   . Lack of Transportation (Medical): Not on file  . Lack of Transportation (Non-Medical): Not on file  Physical Activity:   . Days of Exercise per Week: Not on file  . Minutes of Exercise per Session: Not on file  Stress:   . Feeling of Stress : Not on file  Social Connections:   . Frequency of Communication with Friends and Family: Not on file  . Frequency of Social Gatherings with Friends and Family: Not on file  . Attends Religious Services: Not on file  . Active Member of Clubs or Organizations: Not on file  . Attends Archivist Meetings: Not on file  . Marital Status: Not on file    Allergies:  Allergies  Allergen Reactions  . Grass Extracts [Gramineae Pollens] Swelling    Metabolic Disorder Labs: Lab Results  Component Value Date   HGBA1C 5.3 05/07/2019   MPG 105 07/21/2015   MPG 105 08/11/2014   No results found for: PROLACTIN Lab Results  Component Value Date   CHOL 203 (H) 05/07/2019   TRIG 104 05/07/2019   HDL 61 05/07/2019   CHOLHDL 3.3 05/07/2019   VLDL 12 07/21/2015   LDLCALC 123 (H) 05/07/2019   LDLCALC 129 07/21/2015   Lab Results  Component Value Date   TSH 0.644 05/07/2019   TSH 1.69 07/21/2015    Therapeutic  Level Labs: No results found for: LITHIUM No results found for: VALPROATE No components found for:  CBMZ  Current Medications: Current Outpatient Medications  Medication Sig Dispense Refill  . amphetamine-dextroamphetamine (ADDERALL) 30 MG tablet Take 1 tablet by mouth 2 (two) times daily. 60 tablet 0  . ketoconazole (NIZORAL) 2 % shampoo Apply 1 application topically 2 (two) times a week. 120 mL 3  . Multiple Vitamins-Minerals (EMERGEN-C IMMUNE PLUS) PACK Take 1 tablet by mouth 2 (two) times daily. 10 each 0  . ondansetron (ZOFRAN ODT) 4 MG disintegrating tablet Take 1 tablet (4 mg  total) by mouth every 8 (eight) hours as needed for up to 15 doses for nausea or vomiting. 15 tablet 0  . pantoprazole (PROTONIX) 40 MG tablet Take 1 tablet (40 mg total) by mouth daily. 90 tablet 1  . promethazine-dextromethorphan (PROMETHAZINE-DM) 6.25-15 MG/5ML syrup Take 5 mLs by mouth 4 (four) times daily as needed for cough. 120 mL 0  . tretinoin (RETIN-A) 0.05 % cream Apply topically at bedtime. 45 g 0  . ziprasidone (GEODON) 40 MG capsule Take 1 capsule (40 mg total) by mouth daily after supper. 30 capsule 0   No current facility-administered medications for this visit.       Psychiatric Specialty Exam: Review of Systems  Psychiatric/Behavioral: Positive for hallucinations.  All other systems reviewed and are negative.   There were no vitals taken for this visit.There is no height or weight on file to calculate BMI.  General Appearance: NA  Eye Contact:  NA  Speech:  Clear and Coherent and Normal Rate  Volume:  Normal  Mood:  Euthymic  Affect:  NA  Thought Process:  Goal Directed and Linear  Orientation:  Full (Time, Place, and Person)  Thought Content: Hallucinations: Auditory   Suicidal Thoughts:  No  Homicidal Thoughts:  No  Memory:  Immediate;   Good Recent;   Good Remote;   Good  Judgement:  Good  Insight:  Fair  Psychomotor Activity:  NA  Concentration:  Concentration: Fair   Recall:  Good  Fund of Knowledge: Good  Language: Good  Akathisia:  Negative  Handed:  Right  AIMS (if indicated): not done  Assets:  Communication Skills Desire for Improvement Financial Resources/Insurance Housing Talents/Skills  ADL's:  Intact  Cognition: WNL  Sleep:  Good   Screenings: PHQ2-9     Office Visit from 05/07/2019 in [facility] Visit from 11/26/2018 in [facility] from 09/03/2018 in [facility] Visit from 12/29/2017 in [facility] Visit from 07/22/2016 in [facility]  PHQ-2 Total Score  0  3  2  6  4   PHQ-9 Total Score  -  13  8  16  16        Assessment and Plan: 34 yo single AA male who presents with concerns about recurrent perceptual disturbances that worry his new girlfriend as they will often contribute to him talking loudly to self which she perceives as "crazy behavior". Doane describes occasionally hearing male orr male voice which are not saying anything bad about him and are not commanding in nature. He cannot identify any triggers (stress) and he maintains full reality testing ability, I.e. is fully aware that these voices are not real. He had a hx of feeling depressed and in the past having passive suicidal ideation but does not associate AH with worsening depression. He also has a hx of excessive worrying but again does not see a correlation between the two.  He denies having hx of abuse/trauma. He has a hx of learning disability and ADHD and was on and off on stimulants. Again he did not noticed increase in frequency or intensity of AH when he was taking dextroamphetamine. At this time he does not take Adderall or any other psychotropic meds. He has tried risperidone 1 mg bid for a few weeks but did not notice any benefit. At this time he denies feeling particularly depressed or anxious ( except for potential impact his lod talking to self can have on his  relationship). He  states that he was "a wild child with attitude" but matured. He was in counseling because of that in the past.  His sleep is good and so is appetite. He has no delusions, no problems with concentration or memory. He feels tired and has low motivation, in general. He is not suicidal. He has no hx of mania (occasional irritability only), alcohol or drug abuse. There is no family psychiatric history to his knowledge.  His symptoms do not meet criteria for schizophrenia - no delusions, no thought or behavioral disorganization, no negative symptoms. No substance abuse or mood disorder component either. He is fully aware that the voices he hears are not real but still tends to "talk with them" - responds. He is interested in trying a medication or two to help them stop. We tried addition of ziprasidone 20 mg bid - he took it bid for less then 2 weeks and stopped after becoming ill with URI. He reports that AM dose made him very tired and he did not notice decline in [facility] after that brief period.  Dx: Psychotic disorder with hallucinations, unspecified; Anxiety - GAD and ADHD by hx.       Plan: We will resume ziprasidone but at 40 mg dose in the evening only. If he tolerates it well but AH continue we may increase the  Dose at next appointment in 4 weeks.n 2-3 weeks. If it is not helpful we could try aripiprazole next. The plan was discussed with patient who had an opportunity to ask questions and these were all answered. I spend 25 minutes in phone consultation with the patient.    Stephanie Acre, MD 05/25/2019, 3:10 PM

## 2019-05-27 ENCOUNTER — Other Ambulatory Visit: Payer: Self-pay | Admitting: Medical

## 2019-05-27 ENCOUNTER — Telehealth: Payer: Self-pay | Admitting: Medical

## 2019-05-27 ENCOUNTER — Ambulatory Visit (INDEPENDENT_AMBULATORY_CARE_PROVIDER_SITE_OTHER): Payer: Medicaid Other | Admitting: Psychology

## 2019-05-27 DIAGNOSIS — F070 Personality change due to known physiological condition: Secondary | ICD-10-CM | POA: Diagnosis not present

## 2019-05-27 DIAGNOSIS — F900 Attention-deficit hyperactivity disorder, predominantly inattentive type: Secondary | ICD-10-CM | POA: Diagnosis not present

## 2019-05-27 DIAGNOSIS — F4323 Adjustment disorder with mixed anxiety and depressed mood: Secondary | ICD-10-CM | POA: Diagnosis not present

## 2019-05-27 MED ORDER — AMOXICILLIN 875 MG PO TABS: 875.0000 mg | ORAL_TABLET | Freq: Two times a day (BID) | ORAL | 0 refills | Status: DC

## 2019-05-27 NOTE — Telephone Encounter (Signed)
Pt called back and stated that he has been coughing up phlegm today so he may need and antiboitic

## 2019-05-27 NOTE — Telephone Encounter (Signed)
Pt called and wanted to see if he could get something else prescribed to him because he Is having the same symptoms he had at his last visit and the other medication is not working.

## 2019-05-27 NOTE — Telephone Encounter (Signed)
I sent amoxicillin. Lets give this a try since not resolving.

## 2019-05-28 NOTE — Telephone Encounter (Signed)
Patient informed. 

## 2019-06-03 ENCOUNTER — Ambulatory Visit: Payer: Medicaid Other | Admitting: Psychology

## 2019-06-10 ENCOUNTER — Ambulatory Visit: Payer: Medicaid Other | Admitting: Psychology

## 2019-06-21 ENCOUNTER — Ambulatory Visit (INDEPENDENT_AMBULATORY_CARE_PROVIDER_SITE_OTHER): Payer: Medicaid Other | Admitting: Family Medicine

## 2019-06-21 ENCOUNTER — Encounter: Payer: Self-pay | Admitting: Family Medicine

## 2019-06-21 ENCOUNTER — Other Ambulatory Visit: Payer: Self-pay

## 2019-06-21 VITALS — Wt 255.0 lb

## 2019-06-21 DIAGNOSIS — R05 Cough: Secondary | ICD-10-CM

## 2019-06-21 DIAGNOSIS — Z20822 Contact with and (suspected) exposure to covid-19: Secondary | ICD-10-CM

## 2019-06-21 DIAGNOSIS — R112 Nausea with vomiting, unspecified: Secondary | ICD-10-CM

## 2019-06-21 DIAGNOSIS — R509 Fever, unspecified: Secondary | ICD-10-CM | POA: Diagnosis not present

## 2019-06-21 DIAGNOSIS — R059 Cough, unspecified: Secondary | ICD-10-CM

## 2019-06-21 MED ORDER — ONDANSETRON 4 MG PO TBDP: 4.0000 mg | ORAL_TABLET | Freq: Three times a day (TID) | ORAL | 0 refills | Status: DC | PRN

## 2019-06-21 NOTE — Progress Notes (Signed)
   Subjective:  Documentation for virtual audio and video telecommunications through [place] encounter:  The patient was located at home. 2 patient identifiers used.  The provider was located in the office. The patient did consent to this visit and is aware of possible charges through their insurance for this visit.  The other persons participating in this telemedicine service were none.    Patient ID: [name], male    DOB: 01-04-1986, 34 y.o.   MRN: [id number]  HPI Chief Complaint  Patient presents with  . sick    coughing, body aches, chills, throwing up, loss of smell,    Complains of headache, feeling feverish, chills, body aches, coughing and vomiting for the past 4 days.  Denies chest pain, palpitations, abdominal pain.   States he has been taking over-the-counter cold medication and does not know the name of it. Feels that he is currently hydrated but is vomiting several times a day.  No known Covid exposure.   Review of Systems Pertinent positives and negatives in the history of present illness.     Objective:   Physical Exam Wt 255 lb (115.7 kg)   BMI 33.64 kg/m   Alert and oriented and in no acute distress.  He is coughing throughout the visit.  Respirations are unlabored and he is able to speak in complete sentences without difficulty.      Assessment & Plan:  Suspected 2019 novel coronavirus infection  Nausea and vomiting, intractability of vomiting not specified, unspecified vomiting type - Plan: ondansetron (ZOFRAN ODT) 4 MG disintegrating tablet  Cough  Fever and chills  This is a virtual visit for symptoms suspicious for COVID-19 infection.  Discussed symptomatic treatment.  I will prescribe Zofran for nausea and vomiting.  He will contact [facility] via text and schedule a Covid test. Discussed red flag symptoms and when to seek immediate medical attention including uncontrollable vomiting, dehydration, shortness of breath or chest pain.  He  verbalizes understanding and agrees to this plan.. Discussed that if he is having issues, he may also let me know and I can schedule him to be seen at the respiratory clinic as long as it is not an emergency. He will follow-up with me virtually on Thursday   Time spent on call was 14 minutes and in review of previous records 20 minutes total.  This virtual service is not related to other E/M service within previous 7 days.

## 2019-06-22 ENCOUNTER — Ambulatory Visit: Payer: Medicaid Other | Attending: Internal Medicine

## 2019-06-22 DIAGNOSIS — Z20822 Contact with and (suspected) exposure to covid-19: Secondary | ICD-10-CM

## 2019-06-23 LAB — NOVEL CORONAVIRUS, NAA: SARS-CoV-2, NAA: NOT DETECTED

## 2019-06-24 ENCOUNTER — Other Ambulatory Visit: Payer: Self-pay

## 2019-06-24 ENCOUNTER — Ambulatory Visit (INDEPENDENT_AMBULATORY_CARE_PROVIDER_SITE_OTHER): Payer: Medicaid Other | Admitting: Psychiatry

## 2019-06-24 ENCOUNTER — Ambulatory Visit: Payer: Medicaid Other | Admitting: Family Medicine

## 2019-06-24 ENCOUNTER — Ambulatory Visit: Payer: Medicaid Other | Admitting: Psychology

## 2019-06-24 ENCOUNTER — Encounter: Payer: Self-pay | Admitting: Family Medicine

## 2019-06-24 VITALS — Wt 255.0 lb

## 2019-06-24 DIAGNOSIS — F28 Other psychotic disorder not due to a substance or known physiological condition: Secondary | ICD-10-CM

## 2019-06-24 DIAGNOSIS — R05 Cough: Secondary | ICD-10-CM

## 2019-06-24 DIAGNOSIS — R059 Cough, unspecified: Secondary | ICD-10-CM

## 2019-06-24 DIAGNOSIS — F411 Generalized anxiety disorder: Secondary | ICD-10-CM

## 2019-06-24 DIAGNOSIS — F060 Psychotic disorder with hallucinations due to known physiological condition: Secondary | ICD-10-CM

## 2019-06-24 DIAGNOSIS — R440 Auditory hallucinations: Secondary | ICD-10-CM

## 2019-06-24 DIAGNOSIS — F29 Unspecified psychosis not due to a substance or known physiological condition: Secondary | ICD-10-CM

## 2019-06-24 DIAGNOSIS — Z20822 Contact with and (suspected) exposure to covid-19: Secondary | ICD-10-CM

## 2019-06-24 MED ORDER — ZIPRASIDONE HCL 40 MG PO CAPS: 40.0000 mg | ORAL_CAPSULE | Freq: Every day | ORAL | 0 refills | Status: DC

## 2019-06-24 NOTE — Progress Notes (Signed)
BH MD/PA/NP OP Progress Note  06/24/2019 2:37 PM [name]  MRN:  [id number] Interview was conducted by phone and I verified that I was speaking with the correct person using two identifiers. I discussed the limitations of evaluation and management by telemedicine and  the availability of in person appointments. Patient expressed understanding and agreed to proceed.  Chief Complaint: "Voices decreased some".  HPI: 34 yo single AA male who presents with concerns about recurrent perceptual disturbances that worry his new girlfriend as they will often contribute to him talking loudly to self which she perceives as "crazy behavior". Argie describes occasionally hearing male orr male voice which are not saying anything bad about him and are not commanding in nature. He cannot identify any triggers (stress) and he maintains full reality testing ability, I.e. is fully aware that these voices are not real. He had a hx of feeling depressed and in the past having passive suicidal ideation but does not associate AH with worsening depression. He also has a hx of excessive worrying but again does not see a correlation between the two. He denies having hx of abuse/trauma. He has a hx of learning disability and ADHD and was on and off on stimulants. Again he did not noticed increase in frequency or intensity of AH when he was taking dextroamphetamine. At this time he does not take Adderall or any other psychotropic meds. He has tried risperidone 1 mg bid for a few weeks but did not notice any benefit. At this time he denies feeling particularly depressed or anxious ( except for potential impact his lod talking to self can have on his relationship). He states that he was "a wild child with attitude" but matured. He was in counseling because of that in the past. His sleep is good and so is appetite. He has no delusions, no problems with concentration or memory. He feels tired and has low motivation, in general.  He is not suicidal. He has no hx of mania (occasional irritability only), alcohol or drug abuse. There is no family psychiatric history to his knowledge. His symptoms do not meet criteria for schizophrenia - no delusions, no thought or behavioral disorganization, no negative symptoms. No substance abuse or mood disorder component either. He is fully aware that the voices he hears are not real but still tends to "talk with them" - responds. He is interested in trying a medication or two to help them stop. We tried addition of ziprasidone 20 mg bid - he took it bid for less then 2 weeks and stopped after becoming ill with URI. He reports that AM dose made him very tired and he did not notice decline in [facility] after that brief period. We than changed it to 40 mg at HS which he tolerates better and after being on it for 4 weeks reports some decline in frequency of hallucinations.  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  Past Medical History:  Past Medical History:  Diagnosis Date  . ADD (attention deficit disorder)   . Anxiety   . Depression   . Gastritis 03/2015   EGD Dr. [name]  . H/O echocardiogram 10/2007   normal stress echo, Dr. Jenkins Rouge  . Learning disability   . Obesity   . Tinea corporis   . Wears glasses     Past Surgical History:  Procedure Laterality Date  .  ESOPHAGOGASTRODUODENOSCOPY  03/2015   gastritis; Dr. [name]    Family Psychiatric History: None.  Family History:  Family History  Problem Relation Age of Onset  . Hypertension Mother   . Deep vein thrombosis Mother   . Multiple sclerosis Father   . Diabetes Paternal Aunt   . Heart disease Paternal Uncle   . Prostate cancer Paternal Grandfather   . Cancer Paternal Grandmother        uterine  . Stroke Neg Hx     Social History:  Social History   Socioeconomic History  . Marital status: Single     Spouse name: Not on file  . Number of children: 0  . Years of education: Not on file  . Highest education level: Not on file  Occupational History  . Occupation: Ship broker  Tobacco Use  . Smoking status: Never Smoker  . Smokeless tobacco: Never Used  Substance and Sexual Activity  . Alcohol use: No  . Drug use: No  . Sexual activity: Yes    Partners: Female  Other Topics Concern  . Not on file  Social History Narrative   Lives with father.   Has degree from [employer] for [employer], does modeling and acting on the side.   Exercise- some weights, walking.  Diet - ok, avoids soda, tries to eat healthy.  04/2019   Social Determinants of Health   Financial Resource Strain:   . Difficulty of Paying Living Expenses: Not on file  Food Insecurity:   . Worried About Charity fundraiser in the Last Year: Not on file  . Ran Out of Food in the Last Year: Not on file  Transportation Needs:   . Lack of Transportation (Medical): Not on file  . Lack of Transportation (Non-Medical): Not on file  Physical Activity:   . Days of Exercise per Week: Not on file  . Minutes of Exercise per Session: Not on file  Stress:   . Feeling of Stress : Not on file  Social Connections:   . Frequency of Communication with Friends and Family: Not on file  . Frequency of Social Gatherings with Friends and Family: Not on file  . Attends Religious Services: Not on file  . Active Member of Clubs or Organizations: Not on file  . Attends Archivist Meetings: Not on file  . Marital Status: Not on file    Allergies:  Allergies  Allergen Reactions  . Grass Extracts [Gramineae Pollens] Swelling    Metabolic Disorder Labs: Lab Results  Component Value Date   HGBA1C 5.3 05/07/2019   MPG 105 07/21/2015   MPG 105 08/11/2014   No results found for: PROLACTIN Lab Results  Component Value Date   CHOL 203 (H) 05/07/2019   TRIG 104 05/07/2019   HDL 61 05/07/2019   CHOLHDL 3.3 05/07/2019   VLDL 12  07/21/2015   LDLCALC 123 (H) 05/07/2019   LDLCALC 129 07/21/2015   Lab Results  Component Value Date   TSH 0.644 05/07/2019   TSH 1.69 07/21/2015    Therapeutic Level Labs: No results found for: LITHIUM No results found for: VALPROATE No components found for:  CBMZ  Current Medications: Current Outpatient Medications  Medication Sig Dispense Refill  . amphetamine-dextroamphetamine (ADDERALL) 30 MG tablet Take 1 tablet by mouth 2 (two) times daily. 60 tablet 0  . ketoconazole (NIZORAL) 2 % shampoo Apply 1 application topically 2 (two) times a week. 120 mL 3  . Multiple Vitamins-Minerals (EMERGEN-C IMMUNE PLUS) PACK  Take 1 tablet by mouth 2 (two) times daily. 10 each 0  . ondansetron (ZOFRAN ODT) 4 MG disintegrating tablet Take 1 tablet (4 mg total) by mouth every 8 (eight) hours as needed for nausea or vomiting. 20 tablet 0  . pantoprazole (PROTONIX) 40 MG tablet Take 1 tablet (40 mg total) by mouth daily. 90 tablet 1  . promethazine-dextromethorphan (PROMETHAZINE-DM) 6.25-15 MG/5ML syrup Take 5 mLs by mouth 4 (four) times daily as needed for cough. (Patient not taking: Reported on 06/21/2019) 120 mL 0  . tretinoin (RETIN-A) 0.05 % cream Apply topically at bedtime. (Patient not taking: Reported on 06/21/2019) 45 g 0  . ziprasidone (GEODON) 40 MG capsule Take 1 capsule (40 mg total) by mouth daily after supper. 30 capsule 0   No current facility-administered medications for this visit.     Psychiatric Specialty Exam: Review of Systems  Respiratory: Positive for cough.   Psychiatric/Behavioral: Positive for hallucinations.  All other systems reviewed and are negative.   There were no vitals taken for this visit.There is no height or weight on file to calculate BMI.  General Appearance: NA  Eye Contact:  NA  Speech:  Clear and Coherent and Normal Rate  Volume:  Normal  Mood:  Euthymic  Affect:  NA  Thought Process:  Goal Directed and Linear  Orientation:  Full (Time, Place, and  Person)  Thought Content: Hallucinations: Auditory   Suicidal Thoughts:  No  Homicidal Thoughts:  No  Memory:  Immediate;   Good Recent;   Good Remote;   Good  Judgement:  Good  Insight:  Fair  Psychomotor Activity:  NA  Concentration:  Concentration: Good  Recall:  Good  Fund of Knowledge: Good  Language: Good  Akathisia:  Negative  Handed:  Right  AIMS (if indicated): not done  Assets:  Communication Skills Desire for Improvement Housing Resilience Social Support  ADL's:  Intact  Cognition: WNL  Sleep:  Good   Screenings: PHQ2-9     Office Visit from 05/07/2019 in [place] Visit from 11/26/2018 in [place] from 09/03/2018 in [place] Visit from 12/29/2017 in [place] Visit from 07/22/2016 in [place]  PHQ-2 Total Score  0  3  2  6  4   PHQ-9 Total Score  --  13  8  16  16        Assessment and Plan: 34 yo single AA male who presents with concerns about recurrent perceptual disturbances that worry his new girlfriend as they will often contribute to him talking loudly to self which she perceives as "crazy behavior". Burris describes occasionally hearing male orr male voice which are not saying anything bad about him and are not commanding in nature. He cannot identify any triggers (stress) and he maintains full reality testing ability, I.e. is fully aware that these voices are not real. He had a hx of feeling depressed and in the past having passive suicidal ideation but does not associate AH with worsening depression. He also has a hx of excessive worrying but again does not see a correlation between the two. He denies having hx of abuse/trauma. He has a hx of learning disability and ADHD and was on and off on stimulants. Again he did not noticed increase in frequency or intensity of AH when he was taking dextroamphetamine. At this time he does not take Adderall or any  other psychotropic meds. He has tried risperidone 1 mg bid  for a few weeks but did not notice any benefit. At this time he denies feeling particularly depressed or anxious ( except for potential impact his lod talking to self can have on his relationship). He states that he was "a wild child with attitude" but matured.  His sleep is good and so is appetite. He has no delusions, no problems with concentration or memory. He feels tired and has low motivation, in general. He is not suicidal. He has no hx of mania (occasional irritability only), alcohol or drug abuse. There is no family psychiatric history to his knowledge. His symptoms do not meet criteria for schizophrenia - no delusions, no thought or behavioral disorganization, no negative symptoms. No substance abuse or mood disorder component either. He is fully aware that the voices he hears are not real but still tends to "talk with them" - responds. He is interested in trying a medication or two to help them stop. We tried addition of ziprasidone 20 mg bid - he took it bid for less then 2 weeks and stopped after becoming ill with URI. He reports that AM dose made him very tired and he did not notice decline in [facility] after that brief period. We than changed it to 40 mg at HS which he tolerates better and after being on it for 4 weeks reports some decline in frequency of hallucinations. He denies having tremor of muscle twitching.  Dx: Psychotic disorder with hallucinations, unspecified; Anxiety - GAD and ADHD by hx.  Plan: We have discussed options of continuing on the same dose, increasing it to 60 mg or changing antipsychotic (to aripiprazole). He chose the first option so we will continue ziprasidone 40 mg in the evening for another month. The plan was discussed with patient who had an opportunity to ask questions and these were all answered. I spend64minutes in phone consultationwith the patient.    Stephanie Acre, MD 06/24/2019, 2:37  PM

## 2019-06-24 NOTE — Progress Notes (Signed)
   Subjective:  Documentation for virtual audio and video telecommunications through [facility] encounter:  The patient was located at home. 2 patient identifiers used.  The provider was located in the office. The patient did consent to this visit and is aware of possible charges through their insurance for this visit.  The other persons participating in this telemedicine service were none.    Patient ID: [name], male    DOB: [birth year], 34 y.o.   MRN: [id number]  HPI Chief Complaint  Patient presents with  . follow-up on covid    follow-up on covid- coughing alot, tired   This is a 3 day follow up on coughing, nausea and vomiting.  States he still feels hot at times and is still coughing. He has a dry cough.  Reports feeling short of breath when he gets up and starts moving around the house.  No longer vomiting and is able to drink and eat.   He has not taken any medication for his symptoms in the past 24 hours.   States he has Mucinex DM at home.   No new concerns.  Denies chills, body aches, dizziness, chest pain, palpitations, abdominal pain, diarrhea     Review of Systems Pertinent positives and negatives in the history of present illness.     Objective:   Physical Exam Wt 255 lb (115.7 kg)   BMI 33.64 kg/m   Alert and oriented and in no acute distress.  Respirations unlabored.  Normal speech, mood and thought process      Assessment & Plan:  Cough  Suspected 2019 novel coronavirus infection  Reviewed labs and he had a negative Covid result.  No obvious underlying infection at this point.  Recommend that he be seen at the respiratory clinic so that he can be evaluated in person and further treatment can be recommended.  For now he will rest, hydrate and try the Mucinex DM that he has at home.  States he cannot go to that appointment this evening but he can go tomorrow.  We will schedule this for him.  Time spent on call was 13 minutes and in review  of previous records 15 minutes total.  This virtual service is related to other E/M service within previous 7 days.

## 2019-06-25 ENCOUNTER — Other Ambulatory Visit: Payer: Self-pay

## 2019-06-25 ENCOUNTER — Ambulatory Visit (INDEPENDENT_AMBULATORY_CARE_PROVIDER_SITE_OTHER): Payer: Medicaid Other | Admitting: Nurse Practitioner

## 2019-06-25 VITALS — BP 112/88 | HR 82 | Temp 98.7°F | Ht 73.0 in | Wt 263.0 lb

## 2019-06-25 DIAGNOSIS — J069 Acute upper respiratory infection, unspecified: Secondary | ICD-10-CM

## 2019-06-25 DIAGNOSIS — R1110 Vomiting, unspecified: Secondary | ICD-10-CM

## 2019-06-25 MED ORDER — FLUTICASONE PROPIONATE 50 MCG/ACT NA SUSP: 2.0000 | Freq: Every day | NASAL | 6 refills | Status: DC

## 2019-06-25 MED ORDER — PREDNISONE 20 MG PO TABS: 40.0000 mg | ORAL_TABLET | Freq: Every day | ORAL | 0 refills | Status: AC

## 2019-06-25 MED ORDER — ALBUTEROL SULFATE HFA 108 (90 BASE) MCG/ACT IN AERS: 2.0000 | INHALATION_SPRAY | Freq: Four times a day (QID) | RESPIRATORY_TRACT | 0 refills | Status: DC | PRN

## 2019-06-25 MED ORDER — PROMETHAZINE-DM 6.25-15 MG/5ML PO SYRP: 5.0000 mL | ORAL_SOLUTION | Freq: Four times a day (QID) | ORAL | 0 refills | Status: DC | PRN

## 2019-06-25 NOTE — Progress Notes (Signed)
ICD-10-CM   1. Viral upper respiratory illness  J06.9   2. Post-tussive vomiting  R11.10      History of Present Illness: [name] is a 34 y.o.  male  who presents to the [facility] Respiratory clinic with the following symptoms.  Patient presents with complaints of cough, shortness of breath with certain activities, congestion, headache, and emesis after coughing. Patient first developed a cough and other COVID-like symptoms around Christmas. Patient was seen in the ED on 12/30, where he received a negative COVID-test. Chest x-ray and labs were normal with the exception of an elevated creatinine. Patient states he got a little better, but started having similar symptoms again last week. Patient received a negative COVID-19 test on 06/22/19. Patient has been treated with promethazine-DM and zofran. Patient states the promethazine helps the cough, but he has run out of the medication. Patient has also been taking mucinex. Patient states he vomited once on Saturday and again last night after coughing. Patient states he is drinking well. Patient states he is urinating often, but the urine is a little dark.   Denies fever, chills, body aches, chest pain, palpitation, abdominal pain, diarrhea, or lower extremity pain or swelling.   Past Medical History: Allergies: Allergies  Allergen Reactions  . Grass Extracts [Gramineae Pollens] Swelling    Current Medications:  Current Outpatient Medications:  .  amphetamine-dextroamphetamine (ADDERALL) 30 MG tablet, Take 1 tablet by mouth 2 (two) times daily., Disp: 60 tablet, Rfl: 0 .  ketoconazole (NIZORAL) 2 % shampoo, Apply 1 application topically 2 (two) times a week., Disp: 120 mL, Rfl: 3 .  Multiple Vitamins-Minerals (EMERGEN-C IMMUNE PLUS) PACK, Take 1 tablet by mouth 2 (two) times daily., Disp: 10 each, Rfl: 0 .  ondansetron (ZOFRAN ODT) 4 MG disintegrating tablet, Take 1 tablet (4 mg total) by mouth every 8 (eight) hours as needed for nausea  or vomiting., Disp: 20 tablet, Rfl: 0 .  pantoprazole (PROTONIX) 40 MG tablet, Take 1 tablet (40 mg total) by mouth daily., Disp: 90 tablet, Rfl: 1 .  ziprasidone (GEODON) 40 MG capsule, Take 1 capsule (40 mg total) by mouth daily after supper., Disp: 30 capsule, Rfl: 0 .  albuterol (VENTOLIN HFA) 108 (90 Base) MCG/ACT inhaler, Inhale 2 puffs into the lungs every 6 (six) hours as needed for wheezing or shortness of breath., Disp: 6.7 g, Rfl: 0 .  fluticasone (FLONASE) 50 MCG/ACT nasal spray, Place 2 sprays into both nostrils daily., Disp: 16 g, Rfl: 6 .  predniSONE (DELTASONE) 20 MG tablet, Take 2 tablets (40 mg total) by mouth daily with breakfast for 5 days., Disp: 10 tablet, Rfl: 0 .  promethazine-dextromethorphan (PROMETHAZINE-DM) 6.25-15 MG/5ML syrup, Take 5 mLs by mouth 4 (four) times daily as needed for cough., Disp: 120 mL, Rfl: 0  Past Medical Problems: Past Medical History:  Diagnosis Date  . ADD (attention deficit disorder)   . Anxiety   . Depression   . Gastritis 03/2015   EGD Dr. [name]  . H/O echocardiogram 10/2007   normal stress echo, Dr. Jenkins Rouge  . Learning disability   . Obesity   . Tinea corporis   . Wears glasses     Past Surgical History: Past Surgical History:  Procedure Laterality Date  . ESOPHAGOGASTRODUODENOSCOPY  03/2015   gastritis; Dr. [name]     Social History: Social History   Socioeconomic History  . Marital status: Single    Spouse name: Not on file  . Number of children:  0  . Years of education: Not on file  . Highest education level: Not on file  Occupational History  . Occupation: Ship broker  Tobacco Use  . Smoking status: Never Smoker  . Smokeless tobacco: Never Used  Substance and Sexual Activity  . Alcohol use: No  . Drug use: No  . Sexual activity: Yes    Partners: Female  Other Topics Concern  . Not on file  Social History Narrative   Lives with father.   Has degree from [employer] for [employer], does modeling  and acting on the side.   Exercise- some weights, walking.  Diet - ok, avoids soda, tries to eat healthy.  04/2019   Social Determinants of Health   Financial Resource Strain:   . Difficulty of Paying Living Expenses: Not on file  Food Insecurity:   . Worried About Charity fundraiser in the Last Year: Not on file  . Ran Out of Food in the Last Year: Not on file  Transportation Needs:   . Lack of Transportation (Medical): Not on file  . Lack of Transportation (Non-Medical): Not on file  Physical Activity:   . Days of Exercise per Week: Not on file  . Minutes of Exercise per Session: Not on file  Stress:   . Feeling of Stress : Not on file  Social Connections:   . Frequency of Communication with Friends and Family: Not on file  . Frequency of Social Gatherings with Friends and Family: Not on file  . Attends Religious Services: Not on file  . Active Member of Clubs or Organizations: Not on file  . Attends Archivist Meetings: Not on file  . Marital Status: Not on file  Intimate Partner Violence:   . Fear of Current or Ex-Partner: Not on file  . Emotionally Abused: Not on file  . Physically Abused: Not on file  . Sexually Abused: Not on file    Family History: Family History  Problem Relation Age of Onset  . Hypertension Mother   . Deep vein thrombosis Mother   . Multiple sclerosis Father   . Diabetes Paternal Aunt   . Heart disease Paternal Uncle   . Prostate cancer Paternal Grandfather   . Cancer Paternal Grandmother        uterine  . Stroke Neg Hx     Review of Systems: Pertinent negatives listed in HPI  Physical Exam: Vital Signs BP 112/88   Pulse 82   Temp 98.7 F (37.1 C)   Ht 6\' 1"  (1.854 m)   Wt 263 lb (119.3 kg)   SpO2 98%   BMI 34.70 kg/m  General:alert, cooperative, appears stated age and no distress HEENT: bilateral TM intact and translucent, swollen turbinates  Chest: symmetrical rise and fall Lungs: clear throughout, frequent  coughing Cardiac: RRR, +2 bilateral radial and pedal pulses Abdomen: soft, non-distended, non-tender Musculoskeletal: MAE x4 Neuro: A&O x3 Extremities: no lower extremity pain or swelling Skin: warm, dry  Assessment/Plan:  1. Viral Upper Respiratory Illness -Prednisone 40 mg daily x5 days -Re-order promethazine-DM for cough -Albuterol inhaler 2 puffs every 4-6 hours prn wheezing -Flonase  -Increase hydration   2. Post-tussive emesis -Re-order promethazine-DM for cough    -The patient was given clear instructions to go to ER or return to medical center if symptoms do not improve, worsen or new problems develop. The patient verbalized understanding.  -Return to [address] Respiratory clinic in 7 days.     Electronically signed by: Alfredo Batty, NP  06/25/2019 10:41 PM

## 2019-06-28 ENCOUNTER — Telehealth (INDEPENDENT_AMBULATORY_CARE_PROVIDER_SITE_OTHER): Payer: Self-pay | Admitting: Nurse Practitioner

## 2019-06-28 NOTE — Telephone Encounter (Signed)
Attempted to contact [name] to check on his shortness of breath and confirm her received the prescribed medications. No answer and unable to leave voicemail.

## 2019-06-28 NOTE — Telephone Encounter (Signed)
Attempted to contact Mr. Grullon to check on his shortness of breath and confirm her received the prescribed medications. No answer and unable to leave voicemail.

## 2019-07-08 ENCOUNTER — Ambulatory Visit (INDEPENDENT_AMBULATORY_CARE_PROVIDER_SITE_OTHER): Payer: Medicaid Other | Admitting: Psychology

## 2019-07-08 DIAGNOSIS — F4323 Adjustment disorder with mixed anxiety and depressed mood: Secondary | ICD-10-CM

## 2019-07-08 DIAGNOSIS — F900 Attention-deficit hyperactivity disorder, predominantly inattentive type: Secondary | ICD-10-CM

## 2019-07-08 DIAGNOSIS — F3181 Bipolar II disorder: Secondary | ICD-10-CM

## 2019-07-08 DIAGNOSIS — F70 Mild intellectual disabilities: Secondary | ICD-10-CM | POA: Diagnosis not present

## 2019-07-13 ENCOUNTER — Emergency Department (HOSPITAL_BASED_OUTPATIENT_CLINIC_OR_DEPARTMENT_OTHER)
Admission: EM | Admit: 2019-07-13 | Discharge: 2019-07-14 | Disposition: A | Discharge status: Home / Self Care | Payer: Medicaid Other | Attending: Emergency Medicine | Admitting: Emergency Medicine

## 2019-07-13 ENCOUNTER — Encounter (HOSPITAL_BASED_OUTPATIENT_CLINIC_OR_DEPARTMENT_OTHER): Payer: Self-pay | Admitting: Oncology

## 2019-07-13 ENCOUNTER — Other Ambulatory Visit: Payer: Self-pay

## 2019-07-13 DIAGNOSIS — R6889 Other general symptoms and signs: Secondary | ICD-10-CM

## 2019-07-13 DIAGNOSIS — R1084 Generalized abdominal pain: Secondary | ICD-10-CM

## 2019-07-13 DIAGNOSIS — Z79899 Other long term (current) drug therapy: Secondary | ICD-10-CM | POA: Insufficient documentation

## 2019-07-13 DIAGNOSIS — J111 Influenza due to unidentified influenza virus with other respiratory manifestations: Secondary | ICD-10-CM | POA: Insufficient documentation

## 2019-07-13 DIAGNOSIS — R509 Fever, unspecified: Secondary | ICD-10-CM | POA: Diagnosis present

## 2019-07-13 DIAGNOSIS — R0789 Other chest pain: Secondary | ICD-10-CM | POA: Diagnosis not present

## 2019-07-13 HISTORY — DX: Unspecified psychosis not due to a substance or known physiological condition: F29

## 2019-07-13 NOTE — ED Triage Notes (Signed)
Pt c/o cough, sore throat, fevers, chills, HA and abdominal pain x 1 month.  Has been tested for COVID x 2 both negative.  Pt states he was seen at urgent care and rx promethazine cough syrup, inhaler and zofran ODT.  Pt states these sx have been present x 1 month however states they are worse tonight.

## 2019-07-14 ENCOUNTER — Encounter (HOSPITAL_BASED_OUTPATIENT_CLINIC_OR_DEPARTMENT_OTHER): Payer: Self-pay | Admitting: Emergency Medicine

## 2019-07-14 ENCOUNTER — Emergency Department (HOSPITAL_BASED_OUTPATIENT_CLINIC_OR_DEPARTMENT_OTHER): Payer: Medicaid Other

## 2019-07-14 LAB — URINALYSIS, ROUTINE W REFLEX MICROSCOPIC
Bilirubin Urine: NEGATIVE
Glucose, UA: NEGATIVE mg/dL
Hgb urine dipstick: NEGATIVE
Ketones, ur: 15 mg/dL — AB
Leukocytes,Ua: NEGATIVE
Nitrite: NEGATIVE
Protein, ur: NEGATIVE mg/dL
Specific Gravity, Urine: >1.03 — ABNORMAL HIGH (ref 1.005–1.030)
pH: 6 (ref 5.0–8.0)

## 2019-07-14 LAB — CBC WITH DIFFERENTIAL/PLATELET
Abs Immature Granulocytes: 0.02 10*3/uL (ref 0.00–0.07)
Basophils Absolute: 0 10*3/uL (ref 0.0–0.1)
Basophils Relative: 0 %
Eosinophils Absolute: 0 10*3/uL (ref 0.0–0.5)
Eosinophils Relative: 0 %
HCT: 49.8 % (ref 39.0–52.0)
Hemoglobin: 16.6 g/dL (ref 13.0–17.0)
Immature Granulocytes: 0 %
Lymphocytes Relative: 32 %
Lymphs Abs: 2.9 10*3/uL (ref 0.7–4.0)
MCH: 28.1 pg (ref 26.0–34.0)
MCHC: 33.3 g/dL (ref 30.0–36.0)
MCV: 84.4 fL (ref 80.0–100.0)
Monocytes Absolute: 0.8 10*3/uL (ref 0.1–1.0)
Monocytes Relative: 9 %
Neutro Abs: 5.2 10*3/uL (ref 1.7–7.7)
Neutrophils Relative %: 59 %
Platelets: 276 10*3/uL (ref 150–400)
RBC: 5.9 MIL/uL — ABNORMAL HIGH (ref 4.22–5.81)
RDW: 13.4 % (ref 11.5–15.5)
WBC: 9 10*3/uL (ref 4.0–10.5)
nRBC: 0 % (ref 0.0–0.2)

## 2019-07-14 LAB — COMPREHENSIVE METABOLIC PANEL
ALT: 42 U/L (ref 0–44)
AST: 24 U/L (ref 15–41)
Albumin: 4.5 g/dL (ref 3.5–5.0)
Alkaline Phosphatase: 45 U/L (ref 38–126)
Anion gap: 10 (ref 5–15)
BUN: 13 mg/dL (ref 6–20)
CO2: 24 mmol/L (ref 22–32)
Calcium: 9.5 mg/dL (ref 8.9–10.3)
Chloride: 104 mmol/L (ref 98–111)
Creatinine, Ser: 1.04 mg/dL (ref 0.61–1.24)
GFR calc Af Amer: >60 mL/min (ref >60)
GFR calc non Af Amer: >60 mL/min (ref >60)
Glucose, Bld: 96 mg/dL (ref 70–99)
Potassium: 3.9 mmol/L (ref 3.5–5.1)
Sodium: 138 mmol/L (ref 135–145)
Total Bilirubin: 0.7 mg/dL (ref 0.3–1.2)
Total Protein: 8 g/dL (ref 6.5–8.1)

## 2019-07-14 LAB — HIV ANTIBODY (ROUTINE TESTING W REFLEX): HIV Screen 4th Generation wRfx: NONREACTIVE

## 2019-07-14 LAB — D-DIMER, QUANTITATIVE: D-Dimer, Quant: 0.31 ug/mL-FEU (ref 0.00–0.50)

## 2019-07-14 LAB — TROPONIN I (HIGH SENSITIVITY): Troponin I (High Sensitivity): 3 ng/L (ref <18)

## 2019-07-14 LAB — LIPASE, BLOOD: Lipase: 17 U/L (ref 11–51)

## 2019-07-14 MED ORDER — KETOROLAC TROMETHAMINE 30 MG/ML IJ SOLN
30.0000 mg | Freq: Once | INTRAMUSCULAR | Status: AC
  Administered 2019-07-14: 30 mg via INTRAVENOUS
  Filled 2019-07-14: qty 1

## 2019-07-14 MED ORDER — IBUPROFEN 800 MG PO TABS: 800.0000 mg | ORAL_TABLET | Freq: Three times a day (TID) | ORAL | 0 refills | Status: DC | PRN

## 2019-07-14 MED ORDER — ONDANSETRON HCL 4 MG/2ML IJ SOLN
4.0000 mg | Freq: Once | INTRAMUSCULAR | Status: AC
  Administered 2019-07-14: 4 mg via INTRAVENOUS
  Filled 2019-07-14: qty 2

## 2019-07-14 MED ORDER — SODIUM CHLORIDE 0.9 % IV BOLUS (SEPSIS)
1000.0000 mL | Freq: Once | INTRAVENOUS | Status: AC
  Administered 2019-07-14: 1000 mL via INTRAVENOUS

## 2019-07-14 MED ORDER — ONDANSETRON 4 MG PO TBDP: 4.0000 mg | ORAL_TABLET | Freq: Four times a day (QID) | ORAL | 0 refills | Status: DC | PRN

## 2019-07-14 NOTE — Discharge Instructions (Addendum)
You may alternate Tylenol 1000 mg every 6 hours as needed for pain, fever and Ibuprofen 800 mg every 8 hours as needed for pain, fever.  Please take Ibuprofen with food.  Do not take more than 4000 mg of Tylenol (acetaminophen) in a 24 hour period.  You may use over-the-counter Imodium as needed for diarrhea.  I recommend that she rest and increase your water intake at home.  Recommend close follow-up with your primary care physician.  Your labs, EKG, urine and chest x-ray today were normal.  I feel COVID-19 and influenza are less likely given you have had ongoing symptoms since December.  No sign of any bacterial infection present today.

## 2019-07-14 NOTE — ED Notes (Signed)
Pt ambulated to bathroom with no distress

## 2019-07-14 NOTE — ED Notes (Signed)
Pt unable to urinate at this time.  

## 2019-07-14 NOTE — ED Provider Notes (Signed)
TIME SEEN: 12:01 AM  CHIEF COMPLAINT: Multiple complaints  HPI: Patient is a 34 year old male with history of obesity, depression, ADD who presents to the emergency department with multiple complaints.  He reports since December he has had subjective fevers, productive cough with yellow sputum, headaches, sore throat, chest pain, abdominal pain, nausea and vomiting, diarrhea.  No dysuria or hematuria.  No rash.  No sick contacts.  No recent travel.  Has had a flu vaccine this year.  Has tested negative for Covid twice in the past 2 months.  Has been seen by his PCP and given cough syrup and reports no improvement in symptoms.  ROS: See HPI Constitutional:  fever  Eyes: no drainage  ENT: no runny nose   Cardiovascular:   chest pain  Resp:  SOB  GI:  vomiting and diarrhea GU: no dysuria Integumentary: no rash  Allergy: no hives  Musculoskeletal: no leg swelling  Neurological: no slurred speech ROS otherwise negative  PAST MEDICAL HISTORY/PAST SURGICAL HISTORY:  Past Medical History:  Diagnosis Date  . ADD (attention deficit disorder)   . Anxiety   . Depression   . Gastritis 03/2015   EGD Dr. [name]  . H/O echocardiogram 10/2007   normal stress echo, Dr. Jenkins Rouge  . Learning disability   . Obesity   . Tinea corporis   . Wears glasses     MEDICATIONS:  Prior to Admission medications   Medication Sig Start Date End Date Taking? Authorizing Provider  albuterol (VENTOLIN HFA) 108 (90 Base) MCG/ACT inhaler Inhale 2 puffs into the lungs every 6 (six) hours as needed for wheezing or shortness of breath. 06/25/19   Dozier-Lineberger, Mayah M, NP  amphetamine-dextroamphetamine (ADDERALL) 30 MG tablet Take 1 tablet by mouth 2 (two) times daily. 11/03/18   Tysinger, Camelia Eng, PA-C  fluticasone (FLONASE) 50 MCG/ACT nasal spray Place 2 sprays into both nostrils daily. 06/25/19   Dozier-Lineberger, Mayah M, NP  ketoconazole (NIZORAL) 2 % shampoo Apply 1 application topically 2 (two) times  a week. 02/25/19   Tysinger, Camelia Eng, PA-C  Multiple Vitamins-Minerals (EMERGEN-C IMMUNE PLUS) PACK Take 1 tablet by mouth 2 (two) times daily. 05/17/19   Tysinger, Camelia Eng, PA-C  ondansetron (ZOFRAN ODT) 4 MG disintegrating tablet Take 1 tablet (4 mg total) by mouth every 8 (eight) hours as needed for nausea or vomiting. 06/21/19   Henson, Vickie L, NP-C  pantoprazole (PROTONIX) 40 MG tablet Take 1 tablet (40 mg total) by mouth daily. 09/03/18   Tysinger, Camelia Eng, PA-C  promethazine-dextromethorphan (PROMETHAZINE-DM) 6.25-15 MG/5ML syrup Take 5 mLs by mouth 4 (four) times daily as needed for cough. 06/25/19   Dozier-Lineberger, Mayah M, NP  ziprasidone (GEODON) 40 MG capsule Take 1 capsule (40 mg total) by mouth daily after supper. 06/24/19 07/24/19  Pucilowski, Marchia Bond, MD    ALLERGIES:  Allergies  Allergen Reactions  . Grass Extracts [Gramineae Pollens] Swelling    SOCIAL HISTORY:  Social History   Tobacco Use  . Smoking status: Never Smoker  . Smokeless tobacco: Never Used  Substance Use Topics  . Alcohol use: No    FAMILY HISTORY: Family History  Problem Relation Age of Onset  . Hypertension Mother   . Deep vein thrombosis Mother   . Multiple sclerosis Father   . Diabetes Paternal Aunt   . Heart disease Paternal Uncle   . Prostate cancer Paternal Grandfather   . Cancer Paternal Grandmother        uterine  . Stroke  Neg Hx     EXAM: BP (!) 143/94 (BP Location: Right Arm)   Pulse (!) 104   Temp 98.7 F (37.1 C) (Oral)   Resp 20   Ht 6\' 1"  (1.854 m)   Wt 115.7 kg   SpO2 100%   BMI 33.64 kg/m  CONSTITUTIONAL: Alert and oriented and responds appropriately to questions. Well-appearing; well-nourished, afebrile, obese, nontoxic-appearing HEAD: Normocephalic EYES: Conjunctivae clear, pupils appear equal, EOM appear intact ENT: normal nose; moist mucous membranes; No pharyngeal erythema or petechiae, no tonsillar hypertrophy or exudate, no uvular deviation, no unilateral  swelling, no trismus or drooling, no muffled voice, normal phonation, no stridor, no dental caries present, no drainable dental abscess noted, no Ludwig's angina, tongue sits flat in the bottom of the mouth, no angioedema, no facial erythema or warmth, no facial swelling; no pain with movement of the neck, no cervical LAD.  TMs are clear bilaterally without erythema, purulence, bulging, perforation, effusion.  No cerumen impaction or sign of foreign body in the external auditory canal. No inflammation, erythema or drainage from the external auditory canal. No signs of mastoiditis. No pain with manipulation of the pinna bilaterally.   NECK: Supple, normal ROM CARD: Regular and tachycardic; S1 and S2 appreciated; no murmurs, no clicks, no rubs, no gallops RESP: Normal chest excursion without splinting or tachypnea; breath sounds clear and equal bilaterally; no wheezes, no rhonchi, no rales, no hypoxia or respiratory distress, speaking full sentences ABD/GI: Normal bowel sounds; non-distended; soft, non-tender, no rebound, no guarding, no peritoneal signs, no hepatosplenomegaly, negative Murphy sign, no tenderness at McBurney's point BACK:  The back appears normal EXT: Normal ROM in all joints; no deformity noted, no edema; no cyanosis, no joint effusions SKIN: Normal color for age and race; warm; no rash on exposed skin NEURO: Moves all extremities equally PSYCH: The patient's mood and manner are appropriate.   MEDICAL DECISION MAKING: Patient here with flulike symptoms for 2 months.  He has had 2 negative Covid tests.  He complains of headache but has no meningismus on exam.  Will treat with Toradol.  Also complains of atypical chest pain but is tachycardic, obese.  Will check D-dimer to rule out PE.  Will obtain 1 troponin to rule out myocarditis but low suspicion for ACS.  Also complaining of abdominal pain, vomiting and diarrhea.  Appears well-hydrated here.  Abdominal exam benign.  Doubt cholecystitis,  appendicitis, colitis, diverticulitis, bowel obstruction.  Will check LFTs, lipase, urinalysis.  Given ongoing flulike symptoms, will obtain HIV test.  If his work-up here today is unremarkable, have recommended outpatient follow-up.  No signs of bacterial infection on clinical exam at this time.  He does not appear toxic, septic.  I do not feel another Covid test at this time is warranted.  He has had symptoms for 2 months and would be outside infectious window.  ED PROGRESS: Patient's labs are reassuring.  Normal hemoglobin.  No leukocytosis.  Normal electrolytes, renal function, LFTs and lipase.  Troponin negative.  Doubt myocarditis, ACS.  D-dimer negative.  Doubt PE.  Chest x-ray clear showing no pneumothorax, pneumonia, pulmonary edema.  Urine shows no sign of infection.  He does have small ketones and has received 2 L of IV fluids here in the ED.  Recommended alternating Tylenol and Motrin for pain.  Recommended Imodium as needed for diarrhea.  Will discharge with Zofran.  Recommended close PCP outpatient follow-up if symptoms continue.  HIV test currently pending.  Low suspicion for any life-threatening  illness today.  At this time, I do not feel there is any life-threatening condition present. I have reviewed, interpreted and discussed all results (EKG, imaging, lab, urine as appropriate) and exam findings with patient/family. I have reviewed nursing notes and appropriate previous records.  I feel the patient is safe to be discharged home without further emergent workup and can continue workup as an outpatient as needed. Discussed usual and customary return precautions. Patient/family verbalize understanding and are comfortable with this plan.  Outpatient follow-up has been provided as needed. All questions have been answered.    EKG Interpretation  Date/Time:  Wednesday July 14 2019 00:24:40 EST Ventricular Rate:  77 PR Interval:    QRS Duration: 99 QT Interval:  388 QTC  Calculation: 440 R Axis:   60 Text Interpretation: Sinus rhythm Borderline T abnormalities, inferior leads No old tracing to compare Confirmed by Telly Jawad, Cyril Mourning [phone]) on 07/14/2019 12:29:35 AM          [name] was evaluated in Emergency Department on 07/14/2019 for the symptoms described in the history of present illness. He was evaluated in the context of the global COVID-19 pandemic, which necessitated consideration that the patient might be at risk for infection with the SARS-CoV-2 virus that causes COVID-19. Institutional protocols and algorithms that pertain to the evaluation of patients at risk for COVID-19 are in a state of rapid change based on information released by regulatory bodies including the CDC and federal and state organizations. These policies and algorithms were followed during the patient's care in the ED.  Patient was seen wearing N95, face shield, gloves, gown.    Kniyah Khun, Delice Bison, DO 07/14/19 [phone]

## 2019-07-19 ENCOUNTER — Encounter: Payer: Self-pay | Admitting: Medical

## 2019-07-19 ENCOUNTER — Other Ambulatory Visit: Payer: Self-pay

## 2019-07-19 ENCOUNTER — Ambulatory Visit: Payer: Medicaid Other | Admitting: Medical

## 2019-07-19 VITALS — Ht 73.0 in | Wt 260.0 lb

## 2019-07-19 DIAGNOSIS — F22 Delusional disorders: Secondary | ICD-10-CM

## 2019-07-19 DIAGNOSIS — R519 Headache, unspecified: Secondary | ICD-10-CM | POA: Diagnosis not present

## 2019-07-19 DIAGNOSIS — F988 Other specified behavioral and emotional disorders with onset usually occurring in childhood and adolescence: Secondary | ICD-10-CM | POA: Diagnosis not present

## 2019-07-19 DIAGNOSIS — G479 Sleep disorder, unspecified: Secondary | ICD-10-CM

## 2019-07-19 DIAGNOSIS — F29 Unspecified psychosis not due to a substance or known physiological condition: Secondary | ICD-10-CM

## 2019-07-19 DIAGNOSIS — F324 Major depressive disorder, single episode, in partial remission: Secondary | ICD-10-CM

## 2019-07-19 DIAGNOSIS — F060 Psychotic disorder with hallucinations due to known physiological condition: Secondary | ICD-10-CM

## 2019-07-19 NOTE — Progress Notes (Signed)
This visit type was conducted due to national recommendations for restrictions regarding the COVID-19 Pandemic (e.g. social distancing) in an effort to limit this patient's exposure and mitigate transmission in our community.  Due to their co-morbid illnesses, this patient is at least at moderate risk for complications without adequate follow up.  This format is felt to be most appropriate for this patient at this time.    Documentation for virtual audio and video telecommunications through Zoom encounter:  The patient was located at home. The provider was located in the office. The patient did consent to this visit and is aware of possible charges through their insurance for this visit.  The other persons participating in this telemedicine service were none. Time spent on call was 20 minutes and in review of previous records >20 minutes total.  This virtual service is not related to other E/M service within previous 7 days.   Subjective: Chief Complaint  Patient presents with  . Headache    possibly mixed medication    Virtual consult today for concerns.  Been having bad headaches for weeks.   Has been having shakes,  Hand and arms tremor, bilat.  No facial twitching.   headaches have been happening daily, sometimes frontal, sometimes in the back of head, sometimes all over.    No numbness, no numbness.  Sometimes gets tingling in hands bilat.   No recent loss of vision, hearing.  Has lost appetite.    Having hard time sleeping.  Gets up several times per night, hard to go back to sleep.  No nausea, no vomiting, but some constipation.   Quit taking Ziprasidone recently.  This was started by psychiatry about a month ago, but didn't like the way it made him feel.  So he stopped the medication.  He found that the medication was causing him to have more weird thoughts and he was talking and even screaming to himself out loud on this medication.   Prior to the medication he sometimes talks  to himself but not this severe.  He quit this medication a few weeks ago.  He also recently quit taking his ADD medication on his own.  He quit ADD medication a few weeks aog.  He thinks he may have taken some of his medications twice in 2 days, doubling up.   This was about a week ago.  No other aggravating or relieving factors. No other complaint.  ROS as in subjective  Past Medical History:  Diagnosis Date  . ADD (attention deficit disorder)   . Anxiety   . Depression   . Gastritis 03/2015   EGD Dr. [name]  . H/O echocardiogram 10/2007   normal stress echo, Dr. Jenkins Rouge  . Learning disability   . Obesity   . Psychotic disorder with hallucinations   . Tinea corporis   . Wears glasses    Current Outpatient Medications on File Prior to Visit  Medication Sig Dispense Refill  . albuterol (VENTOLIN HFA) 108 (90 Base) MCG/ACT inhaler Inhale 2 puffs into the lungs every 6 (six) hours as needed for wheezing or shortness of breath. 6.7 g 0  . amphetamine-dextroamphetamine (ADDERALL) 30 MG tablet Take 1 tablet by mouth 2 (two) times daily. 60 tablet 0  . fluticasone (FLONASE) 50 MCG/ACT nasal spray Place 2 sprays into both nostrils daily. 16 g 6  . ibuprofen (ADVIL) 800 MG tablet Take 1 tablet (800 mg total) by mouth every 8 (eight) hours as needed for mild pain. [place]  tablet 0  . ketoconazole (NIZORAL) 2 % shampoo Apply 1 application topically 2 (two) times a week. 120 mL 3  . Multiple Vitamins-Minerals (EMERGEN-C IMMUNE PLUS) PACK Take 1 tablet by mouth 2 (two) times daily. 10 each 0  . pantoprazole (PROTONIX) 40 MG tablet Take 1 tablet (40 mg total) by mouth daily. 90 tablet 1  . promethazine-dextromethorphan (PROMETHAZINE-DM) 6.25-15 MG/5ML syrup Take 5 mLs by mouth 4 (four) times daily as needed for cough. 120 mL 0  . ziprasidone (GEODON) 40 MG capsule Take 1 capsule (40 mg total) by mouth daily after supper. 30 capsule 0  . ondansetron (ZOFRAN ODT) 4 MG disintegrating tablet  Take 1 tablet (4 mg total) by mouth every 6 (six) hours as needed for nausea or vomiting. (Patient not taking: Reported on 07/19/2019) 20 tablet 0   No current facility-administered medications on file prior to visit.   ROS as in subjective   Objective: Ht 6\' 1"  (1.854 m)   Wt 260 lb (117.9 kg)   BMI 34.30 kg/m   Gen: wd, wn, not examined in person as this is a virtual consult.       Assessment: Encounter Diagnoses  Name Primary?  . New onset headache Yes  . Worsening headaches   . Delusion ([place])   . Attention deficit disorder, unspecified hyperactivity presence   . Psychotic disorder with hallucinations   . Depression, major, single episode, in partial remission ([place])   . Sleep disturbance   . Acute intractable headache, unspecified headache type      Plan: I reviewed his emergency department notes from 07/14/2019 visit.  No unusual findings with comprehensive metabolic panel and CBC.  His sound like most of his symptoms are related to his mental health issues, including delusions, hallucinations, uncontrolled psychosis symptoms.  He was not tolerating a new medicine recently started by psychiatry so he quit his own.  Advised he call psychiatry as soon as we get off the phone today to let them know about his current symptoms, the fact that he stopped the medications and to come up with a plan for follow-up ASAP.  We will pursue a MRI brain given new onset headaches.  Consider sleep study.  Advise if worse in the short-term with current mental health symptoms to go to [facility] emergency department  [name] was seen today for headache.  Diagnoses and all orders for this visit:  New onset headache  Worsening headaches  Delusion ([place])  Attention deficit disorder, unspecified hyperactivity presence  Psychotic disorder with hallucinations  Depression, major, single episode, in partial remission ([place])  Sleep disturbance  Acute intractable headache,  unspecified headache type -     MR Brain Wo Contrast; Future  Follow-up pending MRI

## 2019-07-20 NOTE — Progress Notes (Signed)
Process already started, awaiting an appointment

## 2019-07-21 ENCOUNTER — Ambulatory Visit (HOSPITAL_COMMUNITY): Payer: Medicaid Other | Admitting: Psychiatry

## 2019-07-22 ENCOUNTER — Ambulatory Visit (INDEPENDENT_AMBULATORY_CARE_PROVIDER_SITE_OTHER): Payer: Medicaid Other | Admitting: Psychiatry

## 2019-07-22 ENCOUNTER — Ambulatory Visit: Payer: Medicaid Other | Admitting: Psychology

## 2019-07-22 ENCOUNTER — Other Ambulatory Visit: Payer: Self-pay

## 2019-07-22 DIAGNOSIS — R440 Auditory hallucinations: Secondary | ICD-10-CM | POA: Diagnosis not present

## 2019-07-22 DIAGNOSIS — F060 Psychotic disorder with hallucinations due to known physiological condition: Secondary | ICD-10-CM

## 2019-07-22 DIAGNOSIS — F28 Other psychotic disorder not due to a substance or known physiological condition: Secondary | ICD-10-CM

## 2019-07-22 DIAGNOSIS — F411 Generalized anxiety disorder: Secondary | ICD-10-CM

## 2019-07-22 DIAGNOSIS — F29 Unspecified psychosis not due to a substance or known physiological condition: Secondary | ICD-10-CM

## 2019-07-22 MED ORDER — ARIPIPRAZOLE 5 MG PO TABS: ORAL_TABLET | ORAL | 0 refills | Status: DC

## 2019-07-22 NOTE — Progress Notes (Signed)
BH MD/PA/NP OP Progress Note  07/22/2019 1:13 PM [name]  MRN:  [id number] Interview was conducted by phone and I verified that I was speaking with the correct person using two identifiers. I discussed the limitations of evaluation and management by telemedicine and  the availability of in person appointments. Patient expressed understanding and agreed to proceed.  Chief Complaint: Hallucinations.  HPI: 34 yo single AA male who presents with concerns about recurrent perceptual disturbances that worry his new girlfriend as they will often contribute to him talking loudly to self which she perceives as "crazy behavior". [name] describes hearing male or male voice which he now acknowledges are at times commanding in nature (eg tell him to harm females) - he does not act on these commands though.  He cannot identify any triggers (stress) and he maintains full reality testing ability, I.e. is generally aware that these voices are not real. He had a hx of feeling depressed and in the past having passive suicidal ideation but does not associate AH with worsening depression. He also has a hx of excessive worrying but again does not see a correlation between the two. He denies having hx of abuse/trauma. He has a hx of learning disability and ADHD and was on and off on stimulants. Again he did not noticed increase in frequency or intensity of AH when he was taking dextroamphetamine. At this time he does not take Adderall or any other psychotropic meds. He has tried risperidone 1 mg bid for a few weeks but did not notice any benefit. He has no delusions, no problems with concentration or memory.  He is not suicidal. He has no hx of mania (occasional irritability only), alcohol or drug abuse. There is no family psychiatric history to his knowledge. His symptoms do not meet criteria for schizophrenia - no delusions, no thought or behavioral disorganization, no negative symptoms. No substance abuse or mood  disorder component either. He expressed interest in trying a medication or two to help them stop.We tried addition of ziprasidone 20 mg bid - he took it bid for less then 2 weeks and stopped after becoming ill with URI. He reports that AM dose made him very tired and he did not notice decline in [facility] after that brief period. We than changed it to 40 mg at HS which he tolerates better but did not see any improvement and stopped it after 4 weeks. He did not have any clear EPS on it.   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  Past Medical History:  Past Medical History:  Diagnosis Date  . ADD (attention deficit disorder)   . Anxiety   . Depression   . Gastritis 03/2015   EGD Dr. [name]  . H/O echocardiogram 10/2007   normal stress echo, Dr. Jenkins Rouge  . Learning disability   . Obesity   . Psychotic disorder with hallucinations   . Tinea corporis   . Wears glasses     Past Surgical History:  Procedure Laterality Date  . ESOPHAGOGASTRODUODENOSCOPY  03/2015   gastritis; Dr. [name]    Family Psychiatric History: None.  Family History:  Family History  Problem Relation Age of Onset  . Hypertension Mother   . Deep vein thrombosis Mother   . Multiple sclerosis Father   . Diabetes Paternal Aunt   . Heart disease Paternal Uncle   .  Prostate cancer Paternal Grandfather   . Cancer Paternal Grandmother        uterine  . Stroke Neg Hx     Social History:  Social History   Socioeconomic History  . Marital status: Single    Spouse name: Not on file  . Number of children: 0  . Years of education: Not on file  . Highest education level: Not on file  Occupational History  . Occupation: Ship broker  Tobacco Use  . Smoking status: Never Smoker  . Smokeless tobacco: Never Used  Substance and Sexual Activity  . Alcohol use: No  . Drug use: No  . Sexual activity: Yes     Partners: Female  Other Topics Concern  . Not on file  Social History Narrative   Lives with father.   Has degree from [employer] for [employer], does modeling and acting on the side.   Exercise- some weights, walking.  Diet - ok, avoids soda, tries to eat healthy.  04/2019   Social Determinants of Health   Financial Resource Strain:   . Difficulty of Paying Living Expenses: Not on file  Food Insecurity:   . Worried About Charity fundraiser in the Last Year: Not on file  . Ran Out of Food in the Last Year: Not on file  Transportation Needs:   . Lack of Transportation (Medical): Not on file  . Lack of Transportation (Non-Medical): Not on file  Physical Activity:   . Days of Exercise per Week: Not on file  . Minutes of Exercise per Session: Not on file  Stress:   . Feeling of Stress : Not on file  Social Connections:   . Frequency of Communication with Friends and Family: Not on file  . Frequency of Social Gatherings with Friends and Family: Not on file  . Attends Religious Services: Not on file  . Active Member of Clubs or Organizations: Not on file  . Attends Archivist Meetings: Not on file  . Marital Status: Not on file    Allergies:  Allergies  Allergen Reactions  . Grass Extracts [Gramineae Pollens] Swelling    Metabolic Disorder Labs: Lab Results  Component Value Date   HGBA1C 5.3 05/07/2019   MPG 105 07/21/2015   MPG 105 08/11/2014   No results found for: PROLACTIN Lab Results  Component Value Date   CHOL 203 (H) 05/07/2019   TRIG 104 05/07/2019   HDL 61 05/07/2019   CHOLHDL 3.3 05/07/2019   VLDL 12 07/21/2015   LDLCALC 123 (H) 05/07/2019   LDLCALC 129 07/21/2015   Lab Results  Component Value Date   TSH 0.644 05/07/2019   TSH 1.69 07/21/2015    Therapeutic Level Labs: No results found for: LITHIUM No results found for: VALPROATE No components found for:  CBMZ  Current Medications: Current Outpatient Medications  Medication Sig Dispense  Refill  . albuterol (VENTOLIN HFA) 108 (90 Base) MCG/ACT inhaler Inhale 2 puffs into the lungs every 6 (six) hours as needed for wheezing or shortness of breath. 6.7 g 0  . amphetamine-dextroamphetamine (ADDERALL) 30 MG tablet Take 1 tablet by mouth 2 (two) times daily. 60 tablet 0  . ARIPiprazole (ABILIFY) 5 MG tablet Take 1 tablet (5 mg total) by mouth daily for 14 days, THEN 2 tablets (10 mg total) daily for 16 days. 46 tablet 0  . fluticasone (FLONASE) 50 MCG/ACT nasal spray Place 2 sprays into both nostrils daily. 16 g 6  . ibuprofen (ADVIL) 800 MG tablet  Take 1 tablet (800 mg total) by mouth every 8 (eight) hours as needed for mild pain. 30 tablet 0  . ketoconazole (NIZORAL) 2 % shampoo Apply 1 application topically 2 (two) times a week. 120 mL 3  . Multiple Vitamins-Minerals (EMERGEN-C IMMUNE PLUS) PACK Take 1 tablet by mouth 2 (two) times daily. 10 each 0  . ondansetron (ZOFRAN ODT) 4 MG disintegrating tablet Take 1 tablet (4 mg total) by mouth every 6 (six) hours as needed for nausea or vomiting. (Patient not taking: Reported on 07/19/2019) 20 tablet 0  . pantoprazole (PROTONIX) 40 MG tablet Take 1 tablet (40 mg total) by mouth daily. 90 tablet 1  . promethazine-dextromethorphan (PROMETHAZINE-DM) 6.25-15 MG/5ML syrup Take 5 mLs by mouth 4 (four) times daily as needed for cough. 120 mL 0   No current facility-administered medications for this visit.     Psychiatric Specialty Exam: Review of Systems  Psychiatric/Behavioral: Positive for hallucinations.  All other systems reviewed and are negative.   There were no vitals taken for this visit.There is no height or weight on file to calculate BMI.  General Appearance: NA  Eye Contact:  NA  Speech:  Clear and Coherent and Normal Rate  Volume:  Normal  Mood:  Euthymic  Affect:  NA  Thought Process:  Goal Directed and Linear  Orientation:  Full (Time, Place, and Person)  Thought Content: Hallucinations: Auditory   Suicidal Thoughts:  No   Homicidal Thoughts:  No  Memory:  Immediate;   Good Recent;   Good Remote;   Good  Judgement:  Good  Insight:  Fair  Psychomotor Activity:  NA  Concentration:  Concentration: Fair  Recall:  Good  Fund of Knowledge: Fair  Language: Good  Akathisia:  Negative  Handed:  Right  AIMS (if indicated): not done  Assets:  Communication Skills Desire for Improvement Financial Resources/Insurance Housing Physical Health  ADL's:  Intact  Cognition: WNL  Sleep:  Fair   Screenings: PHQ2-9     Office Visit from 05/07/2019 in [place] Visit from 11/26/2018 in [place] from 09/03/2018 in [place] Visit from 12/29/2017 in [place] Visit from 07/22/2016 in [place]  PHQ-2 Total Score  0  3  2  6  4   PHQ-9 Total Score  -  13  8  16  16        Assessment and Plan: 34 yo single AA male who presents with concerns about recurrent perceptual disturbances that worry his new girlfriend as they will often contribute to him talking loudly to self which she perceives as "crazy behavior". Elya describes hearing male or male voice which he now acknowledges are at times commanding in nature (eg tell him to harm females) - he does not act on these commands though.  He cannot identify any triggers (stress) and he maintains full reality testing ability, I.e. is generally aware that these voices are not real. He had a hx of feeling depressed and in the past having passive suicidal ideation but does not associate AH with worsening depression. He also has a hx of excessive worrying but again does not see a correlation between the two. He denies having hx of abuse/trauma. He has a hx of learning disability and ADHD and was on and off on stimulants. Again he did not noticed increase in frequency or intensity of AH when he was taking dextroamphetamine. At this time he does not take Adderall or  any other  psychotropic meds. He has tried risperidone 1 mg bid for a few weeks but did not notice any benefit. He has no delusions, no problems with concentration or memory.  He is not suicidal. He has no hx of mania (occasional irritability only), alcohol or drug abuse. There is no family psychiatric history to his knowledge. His symptoms do not meet criteria for schizophrenia - no delusions, no thought or behavioral disorganization, no negative symptoms. No substance abuse or mood disorder component either. He expressed interest in trying a medication or two to help them stop.We tried addition of ziprasidone 20 mg bid - he took it bid for less then 2 weeks and stopped after becoming ill with URI. He reports that AM dose made him very tired and he did not notice decline in [facility] after that brief period. We than changed it to 40 mg at HS which he tolerates better but did not see any improvement and stopped it after 4 weeks. He did not have any clear EPS on it.  Dx: Psychotic disorder with hallucinations, unspecified; Anxiety - GAD and ADHD by hx.  Plan: We will try aripiprazole - 5 mg x 14 days then 10 mg daily with option to increase it further if well tolerated and not helpful at a lower dose. Next appointment in one month.The plan was discussed with patient who had an opportunity to ask questions and these were all answered. I spend36minutes inphone consultationwith the patient.   Stephanie Acre, MD 07/22/2019, 1:13 PM

## 2019-07-23 ENCOUNTER — Ambulatory Visit (HOSPITAL_COMMUNITY): Payer: Medicaid Other | Admitting: Psychiatry

## 2019-08-05 ENCOUNTER — Ambulatory Visit: Payer: Medicaid Other | Admitting: Psychology

## 2019-08-16 ENCOUNTER — Other Ambulatory Visit: Payer: Self-pay

## 2019-08-16 ENCOUNTER — Ambulatory Visit
Admission: RE | Admit: 2019-08-16 | Discharge: 2019-08-16 | Disposition: A | Discharge status: Home / Self Care | Payer: Medicaid Other | Source: Ambulatory Visit | Attending: Medical | Admitting: Medical

## 2019-08-16 DIAGNOSIS — R519 Headache, unspecified: Secondary | ICD-10-CM

## 2019-08-19 ENCOUNTER — Ambulatory Visit: Payer: Medicaid Other | Admitting: Psychology

## 2019-08-23 ENCOUNTER — Other Ambulatory Visit: Payer: Self-pay

## 2019-08-23 ENCOUNTER — Ambulatory Visit (INDEPENDENT_AMBULATORY_CARE_PROVIDER_SITE_OTHER): Payer: Medicaid Other | Admitting: Psychiatry

## 2019-08-23 DIAGNOSIS — F060 Psychotic disorder with hallucinations due to known physiological condition: Secondary | ICD-10-CM | POA: Diagnosis not present

## 2019-08-23 DIAGNOSIS — F411 Generalized anxiety disorder: Secondary | ICD-10-CM

## 2019-08-23 DIAGNOSIS — F29 Unspecified psychosis not due to a substance or known physiological condition: Secondary | ICD-10-CM

## 2019-08-23 NOTE — Progress Notes (Signed)
[place] MD/PA/NP OP Progress Note  08/23/2019 11:04 AM [name]  MRN:  [id number] Interview was conducted by phone and I verified that I was speaking with the correct person using two identifiers. I discussed the limitations of evaluation and management by telemedicine and  the availability of in person appointments. Patient expressed understanding and agreed to proceed.  Chief Complaint: Hallucinations.  HPI: 34 yo single AA male who presents with concerns about recurrent perceptual disturbances that worry his new girlfriend as they will often contribute to him talking loudly to self which she perceives as "crazy behavior". [name] describes hearing male or male voice which he now acknowledges are at times commanding in nature (eg tell him to harm females) - he does not act on these commands though.  He cannot identify any triggers (stress) and he maintains full reality testing ability, I.e. is generally aware that these voices are not real. He had a hx of feeling depressed and in the past having passive suicidal ideation but does not associate AH with worsening depression. He also has a hx of excessive worrying but again does not see a correlation between the two. He denies having hx of abuse/trauma. He has a hx of learning disability and ADHD and was on and off on stimulants. Again he did not noticed increase in frequency or intensity of AH when he was taking dextroamphetamine. At this time he does not take Adderall or any other psychotropic meds. He has tried risperidone 1 mg bid for a few weeks but did not notice any benefit. He has no delusions, no problems with concentration or memory.  He is not suicidal. He has no hx of mania (occasional irritability only), alcohol or drug abuse. There is no family psychiatric history to his knowledge. His symptoms do not meet criteria for schizophrenia - no delusions, no thought or behavioral disorganization, no negative symptoms. No substance abuse or mood  disorder component either. He expressed interest in trying a medication or two to help them stop.We tried addition of ziprasidone 20 mg bid - he took it bid for less then 2 weeks and stopped after becoming ill with URI. He reports that AM dose made him very tired and he did not notice decline in [facility] after that brief period.We than changed it to 40 mg at HS which he tolerates better but did not see any improvement and stopped it after 4 weeks. He did not have any clear EPS on it. In early March we started aripiprazole 5 mg which he was supposed to increase after 2 weeks to 10 mg. [name] did not increase dose but stopped taking it because "it was making me feel tired and caused headaches". At the same time he felt that [facility] have subsided some. He will have head MRI done soon and would like to wait until the results are back before discussing any other medication options for hallucinations.   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 Generalized anxiety disorder  F41.1     Past Psychiatric History: Reviewed.  Past Medical History:  Past Medical History:  Diagnosis Date  . ADD (attention deficit disorder)   . Anxiety   . Depression   . Gastritis 03/2015   EGD Dr. [name]  . H/O echocardiogram 10/2007   normal stress echo, Dr. Jenkins Rouge  . Learning disability   . Obesity   . Psychotic disorder with hallucinations   . Tinea corporis   .  Wears glasses     Past Surgical History:  Procedure Laterality Date  . ESOPHAGOGASTRODUODENOSCOPY  03/2015   gastritis; Dr. [name]    Family Psychiatric History: None.  Family History:  Family History  Problem Relation Age of Onset  . Hypertension Mother   . Deep vein thrombosis Mother   . Multiple sclerosis Father   . Diabetes Paternal Aunt   . Heart disease Paternal Uncle   . Prostate cancer Paternal Grandfather   . Cancer Paternal Grandmother        uterine  . Stroke Neg Hx      Social History:  Social History   Socioeconomic History  . Marital status: Single    Spouse name: Not on file  . Number of children: 0  . Years of education: Not on file  . Highest education level: Not on file  Occupational History  . Occupation: Ship broker  Tobacco Use  . Smoking status: Never Smoker  . Smokeless tobacco: Never Used  Substance and Sexual Activity  . Alcohol use: No  . Drug use: No  . Sexual activity: Yes    Partners: Female  Other Topics Concern  . Not on file  Social History Narrative   Lives with father.   Has degree from [employer] for [employer], does modeling and acting on the side.   Exercise- some weights, walking.  Diet - ok, avoids soda, tries to eat healthy.  04/2019   Social Determinants of Health   Financial Resource Strain:   . Difficulty of Paying Living Expenses:   Food Insecurity:   . Worried About Charity fundraiser in the Last Year:   . Arboriculturist in the Last Year:   Transportation Needs:   . Film/video editor (Medical):   Marland Kitchen Lack of Transportation (Non-Medical):   Physical Activity:   . Days of Exercise per Week:   . Minutes of Exercise per Session:   Stress:   . Feeling of Stress :   Social Connections:   . Frequency of Communication with Friends and Family:   . Frequency of Social Gatherings with Friends and Family:   . Attends Religious Services:   . Active Member of Clubs or Organizations:   . Attends Archivist Meetings:   Marland Kitchen Marital Status:     Allergies:  Allergies  Allergen Reactions  . Grass Extracts [Gramineae Pollens] Swelling    Metabolic Disorder Labs: Lab Results  Component Value Date   HGBA1C 5.3 05/07/2019   MPG 105 07/21/2015   MPG 105 08/11/2014   No results found for: PROLACTIN Lab Results  Component Value Date   CHOL 203 (H) 05/07/2019   TRIG 104 05/07/2019   HDL 61 05/07/2019   CHOLHDL 3.3 05/07/2019   VLDL 12 07/21/2015   LDLCALC 123 (H) 05/07/2019   LDLCALC 129  07/21/2015   Lab Results  Component Value Date   TSH 0.644 05/07/2019   TSH 1.69 07/21/2015    Therapeutic Level Labs: No results found for: LITHIUM No results found for: VALPROATE No components found for:  CBMZ  Current Medications: Current Outpatient Medications  Medication Sig Dispense Refill  . albuterol (VENTOLIN HFA) 108 (90 Base) MCG/ACT inhaler Inhale 2 puffs into the lungs every 6 (six) hours as needed for wheezing or shortness of breath. 6.7 g 0  . amphetamine-dextroamphetamine (ADDERALL) 30 MG tablet Take 1 tablet by mouth 2 (two) times daily. 60 tablet 0  . ARIPiprazole (ABILIFY) 5 MG tablet Take 1  tablet (5 mg total) by mouth daily for 14 days, THEN 2 tablets (10 mg total) daily for 16 days. 46 tablet 0  . fluticasone (FLONASE) 50 MCG/ACT nasal spray Place 2 sprays into both nostrils daily. 16 g 6  . ibuprofen (ADVIL) 800 MG tablet Take 1 tablet (800 mg total) by mouth every 8 (eight) hours as needed for mild pain. 30 tablet 0  . ketoconazole (NIZORAL) 2 % shampoo Apply 1 application topically 2 (two) times a week. 120 mL 3  . Multiple Vitamins-Minerals (EMERGEN-C IMMUNE PLUS) PACK Take 1 tablet by mouth 2 (two) times daily. 10 each 0  . ondansetron (ZOFRAN ODT) 4 MG disintegrating tablet Take 1 tablet (4 mg total) by mouth every 6 (six) hours as needed for nausea or vomiting. (Patient not taking: Reported on 07/19/2019) 20 tablet 0  . pantoprazole (PROTONIX) 40 MG tablet Take 1 tablet (40 mg total) by mouth daily. 90 tablet 1  . promethazine-dextromethorphan (PROMETHAZINE-DM) 6.25-15 MG/5ML syrup Take 5 mLs by mouth 4 (four) times daily as needed for cough. 120 mL 0   No current facility-administered medications for this visit.     Psychiatric Specialty Exam: Review of Systems  Psychiatric/Behavioral: Positive for hallucinations. The patient is nervous/anxious.   All other systems reviewed and are negative.   There were no vitals taken for this visit.There is no height  or weight on file to calculate BMI.  General Appearance: NA  Eye Contact:  NA  Speech:  Clear and Coherent and Normal Rate  Volume:  Normal  Mood:  Mi[place] anxiety.  Affect:  NA  Thought Process:  Goal Directed and Linear  Orientation:  Full (Time, Place, and Person)  Thought Content: Episodic AH   Suicidal Thoughts:  No  Homicidal Thoughts:  No  Memory:  Immediate;   Good Recent;   Good Remote;   Good  Judgement:  Good  Insight:  Good  Psychomotor Activity:  NA  Concentration:  Concentration: Fair  Recall:  Good  Fund of Knowledge: Good  Language: Good  Akathisia:  Negative  Handed:  Right  AIMS (if indicated): not done  Assets:  Communication Skills Desire for Improvement Housing Resilience Social Support  ADL's:  Intact  Cognition: WNL  Sleep:  Good   Screenings: PHQ2-9     Office Visit from 05/07/2019 in [place] Visit from 11/26/2018 in [place] from 09/03/2018 in [place] Visit from 12/29/2017 in [place] Visit from 07/22/2016 in [place]  PHQ-2 Total Score  0  3  2  6  4   PHQ-9 Total Score  -  13  8  16  16        Assessment and Plan: 34 yo single AA male who presents with concerns about recurrent perceptual disturbances that worry his new girlfriend as they will often contribute to him talking loudly to self which she perceives as "crazy behavior". Ignatius describes hearing male or male voice which he now acknowledges are at times commanding in nature (eg tell him to harm females) - he does not act on these commands though.  He cannot identify any triggers (stress) and he maintains full reality testing ability, I.e. is generally aware that these voices are not real. He had a hx of feeling depressed and in the past having passive suicidal ideation but does not associate AH with worsening depression. He also has a hx of excessive worrying but again does not  see a correlation between the two. He denies having hx of abuse/trauma. He has a hx of learning disability and ADHD and was on and off on stimulants. Again he did not noticed increase in frequency or intensity of AH when he was taking dextroamphetamine. At this time he does not take Adderall or any other psychotropic meds. He has tried risperidone 1 mg bid for a few weeks but did not notice any benefit. He has no delusions, no problems with concentration or memory.  He is not suicidal. He has no hx of mania (occasional irritability only), alcohol or drug abuse. There is no family psychiatric history to his knowledge. His symptoms do not meet criteria for schizophrenia - no delusions, no thought or behavioral disorganization, no negative symptoms. No substance abuse or mood disorder component either. He expressed interest in trying a medication or two to help them stop.We tried addition of ziprasidone 20 mg bid - he took it bid for less then 2 weeks and stopped after becoming ill with URI. He reports that AM dose made him very tired and he did not notice decline in [facility] after that brief period.We than changed it to 40 mg at HS which he tolerates better but did not see any improvement and stopped it after 4 weeks. He did not have any clear EPS on it. In early March we started aripiprazole 5 mg which he was supposed to incrase after 2 weeks to 10 mg. [name] did not increase dose but stopped taking it because "it was making me feel tired and caused headaches". At the same time he felt that [facility] have subsided some. He will have head MRI done soon and would like to wait until the results are back before discussing any other medication options for hallucinations.  Dx: Psychotic disorder with hallucinations, unspecified; Anxiety - GAD and ADHD by hx.  Plan: Wewill wait until MRI results are back before discussion alternatives to aripiprazole - we could try it again but at bedtime? Next appointment as needed -  patient will call. The plan was discussed with patient who had an opportunity to ask questions and these were all answered. I spend59minutes inphone consultationwith the patient.    Stephanie Acre, MD 08/23/2019, 11:04 AM

## 2019-09-02 ENCOUNTER — Ambulatory Visit: Payer: Medicaid Other | Admitting: Psychology

## 2019-09-16 ENCOUNTER — Ambulatory Visit (INDEPENDENT_AMBULATORY_CARE_PROVIDER_SITE_OTHER): Payer: Medicaid Other | Admitting: Psychology

## 2019-09-16 DIAGNOSIS — F4323 Adjustment disorder with mixed anxiety and depressed mood: Secondary | ICD-10-CM

## 2019-09-16 DIAGNOSIS — F900 Attention-deficit hyperactivity disorder, predominantly inattentive type: Secondary | ICD-10-CM

## 2019-09-16 DIAGNOSIS — F70 Mild intellectual disabilities: Secondary | ICD-10-CM | POA: Diagnosis not present

## 2019-09-18 ENCOUNTER — Ambulatory Visit
Admission: RE | Admit: 2019-09-18 | Discharge: 2019-09-18 | Disposition: A | Discharge status: Home / Self Care | Payer: Medicaid Other | Source: Ambulatory Visit | Attending: Medical | Admitting: Medical

## 2019-09-18 ENCOUNTER — Other Ambulatory Visit: Payer: Self-pay

## 2019-09-18 DIAGNOSIS — R519 Headache, unspecified: Secondary | ICD-10-CM

## 2019-09-21 ENCOUNTER — Other Ambulatory Visit: Payer: Self-pay

## 2019-09-21 DIAGNOSIS — R519 Headache, unspecified: Secondary | ICD-10-CM

## 2019-09-21 DIAGNOSIS — R930 Abnormal findings on diagnostic imaging of skull and head, not elsewhere classified: Secondary | ICD-10-CM

## 2019-09-30 ENCOUNTER — Ambulatory Visit (INDEPENDENT_AMBULATORY_CARE_PROVIDER_SITE_OTHER): Payer: Medicaid Other | Admitting: Psychology

## 2019-09-30 DIAGNOSIS — F70 Mild intellectual disabilities: Secondary | ICD-10-CM | POA: Diagnosis not present

## 2019-09-30 DIAGNOSIS — F900 Attention-deficit hyperactivity disorder, predominantly inattentive type: Secondary | ICD-10-CM

## 2019-09-30 DIAGNOSIS — F4323 Adjustment disorder with mixed anxiety and depressed mood: Secondary | ICD-10-CM | POA: Diagnosis not present

## 2019-10-14 ENCOUNTER — Ambulatory Visit (INDEPENDENT_AMBULATORY_CARE_PROVIDER_SITE_OTHER): Payer: Medicaid Other | Admitting: Psychology

## 2019-10-14 DIAGNOSIS — F70 Mild intellectual disabilities: Secondary | ICD-10-CM | POA: Diagnosis not present

## 2019-10-14 DIAGNOSIS — F4323 Adjustment disorder with mixed anxiety and depressed mood: Secondary | ICD-10-CM

## 2019-10-14 DIAGNOSIS — F900 Attention-deficit hyperactivity disorder, predominantly inattentive type: Secondary | ICD-10-CM | POA: Diagnosis not present

## 2019-10-28 ENCOUNTER — Ambulatory Visit: Payer: Medicaid Other | Admitting: Psychology

## 2019-11-08 ENCOUNTER — Telehealth: Payer: Self-pay | Admitting: Diagnostic Neuroimaging

## 2019-11-08 ENCOUNTER — Ambulatory Visit: Payer: Medicaid Other | Admitting: Diagnostic Neuroimaging

## 2019-11-08 ENCOUNTER — Encounter: Payer: Self-pay | Admitting: Diagnostic Neuroimaging

## 2019-11-08 VITALS — BP 127/88 | HR 80 | Ht 73.0 in | Wt 268.2 lb

## 2019-11-08 DIAGNOSIS — G43109 Migraine with aura, not intractable, without status migrainosus: Secondary | ICD-10-CM

## 2019-11-08 DIAGNOSIS — D320 Benign neoplasm of cerebral meninges: Secondary | ICD-10-CM

## 2019-11-08 NOTE — Progress Notes (Signed)
[facility]  PATIENT: [name] DOB: Dec 27, 1985  REFERRING CLINICIAN: Carlena Hurl, PA-C HISTORY FROM: patient  REASON FOR VISIT: new consult    HISTORICAL  CHIEF COMPLAINT:  Chief Complaint  Patient presents with  . Headache    rm 7 New Pt "headaches for a few months, intermittently; not tried any OTC meds;  on MRI there is pressure on my left side"    HISTORY OF PRESENT ILLNESS:   34 year old male here for evaluation of headaches.  Patient has had headaches since age 20 years old consisting of throbbing, vertex headaches, right-sided headaches, fatigue nausea sensitivity to light vertigo.  No specific triggering factors.  Symptom he sees floaters and spots.  Not having headaches to 3 times per week.  Patient also with history of learning disability and ADHD.  Also has history with depression, anxiety, auditory hallucinations.  Patient had MRI of the brain which showed small possible meningioma in the left CP angle.   REVIEW OF SYSTEMS: Full 14 system review of systems performed and negative with exception of: as per HPI.  ALLERGIES: Allergies  Allergen Reactions  . Grass Extracts [Gramineae Pollens] Swelling    HOME MEDICATIONS: Outpatient Medications Prior to Visit  Medication Sig Dispense Refill  . albuterol (VENTOLIN HFA) 108 (90 Base) MCG/ACT inhaler Inhale 2 puffs into the lungs every 6 (six) hours as needed for wheezing or shortness of breath. 6.7 g 0  . amphetamine-dextroamphetamine (ADDERALL) 30 MG tablet Take 1 tablet by mouth 2 (two) times daily. 60 tablet 0  . ketoconazole (NIZORAL) 2 % shampoo Apply 1 application topically 2 (two) times a week. 120 mL 3  . fluticasone (FLONASE) 50 MCG/ACT nasal spray Place 2 sprays into both nostrils daily. (Patient not taking: Reported on 11/08/2019) 16 g 6  . Multiple Vitamins-Minerals (EMERGEN-C IMMUNE PLUS) PACK Take 1 tablet by mouth 2 (two) times daily. (Patient not taking: Reported  on 11/08/2019) 10 each 0  . ARIPiprazole (ABILIFY) 5 MG tablet Take 1 tablet (5 mg total) by mouth daily for 14 days, THEN 2 tablets (10 mg total) daily for 16 days. 46 tablet 0  . ibuprofen (ADVIL) 800 MG tablet Take 1 tablet (800 mg total) by mouth every 8 (eight) hours as needed for mild pain. 30 tablet 0  . ondansetron (ZOFRAN ODT) 4 MG disintegrating tablet Take 1 tablet (4 mg total) by mouth every 6 (six) hours as needed for nausea or vomiting. (Patient not taking: Reported on 07/19/2019) 20 tablet 0  . pantoprazole (PROTONIX) 40 MG tablet Take 1 tablet (40 mg total) by mouth daily. 90 tablet 1  . promethazine-dextromethorphan (PROMETHAZINE-DM) 6.25-15 MG/5ML syrup Take 5 mLs by mouth 4 (four) times daily as needed for cough. 120 mL 0   No facility-administered medications prior to visit.    PAST MEDICAL HISTORY: Past Medical History:  Diagnosis Date  . ADD (attention deficit disorder)   . Anxiety   . Depression   . Gastritis 03/2015   EGD Dr. [name]  . H/O echocardiogram 10/2007   normal stress echo, Dr. Jenkins Rouge  . Learning disability   . Obesity   . Psychotic disorder with hallucinations   . Tinea corporis   . Wears glasses     PAST SURGICAL HISTORY: Past Surgical History:  Procedure Laterality Date  . ESOPHAGOGASTRODUODENOSCOPY  03/2015   gastritis; Dr. [name]    FAMILY HISTORY: Family History  Problem Relation Age of Onset  . Hypertension Mother   .  Deep vein thrombosis Mother   . Multiple sclerosis Father   . Diabetes Paternal Aunt   . Heart disease Paternal Uncle   . Prostate cancer Paternal Grandfather   . Cancer Paternal Grandmother        uterine  . Stroke Neg Hx     SOCIAL HISTORY: Social History   Socioeconomic History  . Marital status: Single    Spouse name: Not on file  . Number of children: 0  . Years of education: Not on file  . Highest education level: Associate degree: academic program  Occupational History    Comment:  culinary arts  Tobacco Use  . Smoking status: Never Smoker  . Smokeless tobacco: Never Used  Vaping Use  . Vaping Use: Never used  Substance and Sexual Activity  . Alcohol use: No  . Drug use: No  . Sexual activity: Yes    Partners: Female  Other Topics Concern  . Not on file  Social History Narrative   Lives with father.   Has degree from [employer] for [employer], does modeling and acting on the side.   Exercise- some weights, walking.  Diet - ok, avoids soda, tries to eat healthy.  04/2019   Social Determinants of Health   Financial Resource Strain:   . Difficulty of Paying Living Expenses:   Food Insecurity:   . Worried About Charity fundraiser in the Last Year:   . Arboriculturist in the Last Year:   Transportation Needs:   . Film/video editor (Medical):   Marland Kitchen Lack of Transportation (Non-Medical):   Physical Activity:   . Days of Exercise per Week:   . Minutes of Exercise per Session:   Stress:   . Feeling of Stress :   Social Connections:   . Frequency of Communication with Friends and Family:   . Frequency of Social Gatherings with Friends and Family:   . Attends Religious Services:   . Active Member of Clubs or Organizations:   . Attends Archivist Meetings:   Marland Kitchen Marital Status:   Intimate Partner Violence:   . Fear of Current or Ex-Partner:   . Emotionally Abused:   Marland Kitchen Physically Abused:   . Sexually Abused:      PHYSICAL EXAM  GENERAL EXAM/CONSTITUTIONAL: Vitals:  Vitals:   11/08/19 0932  BP: 127/88  Pulse: 80  Weight: 268 lb 3.2 oz (121.7 kg)  Height: 6\' 1"  (1.854 m)     Body mass index is 35.38 kg/m. Wt Readings from Last 3 Encounters:  11/08/19 268 lb 3.2 oz (121.7 kg)  07/19/19 260 lb (117.9 kg)  07/13/19 255 lb (115.7 kg)     Patient is in no distress; well developed, nourished and groomed; neck is supple  CARDIOVASCULAR:  Examination of carotid arteries is normal; no carotid bruits  Regular rate and rhythm, no  murmurs  Examination of peripheral vascular system by observation and palpation is normal  EYES:  Ophthalmoscopic exam of optic discs and posterior segments is normal; no papilledema or hemorrhages  No exam data present  MUSCULOSKELETAL:  Gait, strength, tone, movements noted in Neurologic exam below  NEUROLOGIC: MENTAL STATUS:  No flowsheet data found.  awake, alert, oriented to person, place and time  recent and remote memory intact  normal attention and concentration  language fluent, comprehension intact, naming intact  fund of knowledge appropriate  CRANIAL NERVE:   2nd - no papilledema on fundoscopic exam  2nd, 3rd, 4th, 6th - pupils  equal and reactive to light, visual fields full to confrontation, extraocular muscles intact, no nystagmus  5th - facial sensation symmetric  7th - facial strength symmetric  8th - hearing intact  9th - palate elevates symmetrically, uvula midline  11th - shoulder shrug symmetric  12th - tongue protrusion midline  MOTOR:   normal bulk and tone, full strength in the BUE, BLE  SENSORY:   normal and symmetric to light touch, temperature, vibration  COORDINATION:   finger-nose-finger, fine finger movements normal  REFLEXES:   deep tendon reflexes present and symmetric  GAIT/STATION:   narrow based gait     DIAGNOSTIC DATA (LABS, IMAGING, TESTING) - I reviewed patient records, labs, notes, testing and imaging myself where available.  Lab Results  Component Value Date   WBC 9.0 07/14/2019   HGB 16.6 07/14/2019   HCT 49.8 07/14/2019   MCV 84.4 07/14/2019   PLT 276 07/14/2019      Component Value Date/Time   NA 138 07/14/2019 0034   NA 141 05/07/2019 0917   K 3.9 07/14/2019 0034   CL 104 07/14/2019 0034   CO2 24 07/14/2019 0034   GLUCOSE 96 07/14/2019 0034   BUN 13 07/14/2019 0034   BUN 10 05/07/2019 0917   CREATININE 1.04 07/14/2019 0034   CREATININE 1.03 07/21/2015 0001   CALCIUM 9.5 07/14/2019  0034   PROT 8.0 07/14/2019 0034   PROT 7.3 05/07/2019 0917   ALBUMIN 4.5 07/14/2019 0034   ALBUMIN 4.7 05/07/2019 0917   AST 24 07/14/2019 0034   ALT 42 07/14/2019 0034   ALKPHOS 45 07/14/2019 0034   BILITOT 0.7 07/14/2019 0034   BILITOT 0.4 05/07/2019 0917   GFRNONAA >60 07/14/2019 0034   GFRAA >60 07/14/2019 0034   Lab Results  Component Value Date   CHOL 203 (H) 05/07/2019   HDL 61 05/07/2019   LDLCALC 123 (H) 05/07/2019   TRIG 104 05/07/2019   CHOLHDL 3.3 05/07/2019   Lab Results  Component Value Date   HGBA1C 5.3 05/07/2019   No results found for: VITAMINB12 Lab Results  Component Value Date   TSH 0.644 05/07/2019     09/18/19 MRI brain (without) [I reviewed images myself and agree with interpretation. -VRP]  - 9 mm dural based nodule along the left petrous ridge, focal and a possible calcified meningioma. There is projection into the left CP angle cistern in close to the root entry zone of the left trigeminal nerve, are headaches typical of trigeminal neuralgia? Consider postcontrast imaging.     ASSESSMENT AND PLAN  34 y.o. year old male here with:  Dx:  1. Migraine with aura and without status migrainosus, not intractable   2. Cerebellopontine angle meningioma (HCC)     PLAN:  MIGRAINE WITH AURA  MIGRAINE PREVENTION  LIFESTYLE CHANGES -Stop or avoid smoking -Decrease or avoid caffeine / alcohol -Eat and sleep on a regular schedule -Exercise several times per week - consider topiramate 50mg  at bedtime; after 1-2 weeks increase to 50mg  twice a day; drink plenty of water - consider amitriptyline 25mg  at bedtime  MIGRAINE RESCUE  - ibuprofen, tylenol as needed - rizatriptan (Maxalt) 10mg  as needed for breakthrough headache; may repeat x 1 after 2 hours; max 2 tabs per day or 8 per month  LEFT CP ANGLE NODULE (? meningioma) - likely incidental finding; repeat MRI in 6 months  Orders Placed This Encounter  Procedures  . MR BRAIN/IAC W WO CONTRAST    Return in about 6 months (around  05/09/2020).    Penni Bombard, MD 10/30/2447, 7:53 AM Certified in Neurology, Neurophysiology and Neuroimaging  [facility] [address] [place], [place] [address] [phone]

## 2019-11-08 NOTE — Patient Instructions (Signed)
MIGRAINE PREVENTION  -Stop or avoid smoking -Decrease or avoid caffeine / alcohol -Eat and sleep on a regular schedule -Exercise several times per week  MIGRAINE RESCUE  - ibuprofen, tylenol as needed  LEFT BRAIN NODULE (? meningioma) - likely incidental finding; repeat MRI in 6 months

## 2019-11-08 NOTE — Telephone Encounter (Signed)
Medicaid order sent to [facility]. They will obtain the auth and reach out to the patient to schedule.  

## 2019-11-10 ENCOUNTER — Telehealth: Payer: Self-pay

## 2019-11-10 NOTE — Telephone Encounter (Signed)
Pt. Called stating he needs a refill on his Adderall 30mg  to the [facility] on [address] Pt. Last apt was 07/19/19 future apt is 05/08/20.

## 2019-11-11 ENCOUNTER — Ambulatory Visit (INDEPENDENT_AMBULATORY_CARE_PROVIDER_SITE_OTHER): Payer: Medicaid Other | Admitting: Psychology

## 2019-11-11 DIAGNOSIS — F900 Attention-deficit hyperactivity disorder, predominantly inattentive type: Secondary | ICD-10-CM | POA: Diagnosis not present

## 2019-11-11 DIAGNOSIS — F4323 Adjustment disorder with mixed anxiety and depressed mood: Secondary | ICD-10-CM

## 2019-11-11 DIAGNOSIS — F70 Mild intellectual disabilities: Secondary | ICD-10-CM

## 2019-11-11 NOTE — Telephone Encounter (Signed)
Patient has been informed.

## 2019-11-11 NOTE — Telephone Encounter (Signed)
It looks like he established with psychiatry which is the right step.  Thus, they should also be handling his ADD medication.  Please have him discuss with his psychiatrist

## 2019-11-16 ENCOUNTER — Other Ambulatory Visit (HOSPITAL_COMMUNITY): Payer: Self-pay | Admitting: Psychiatry

## 2019-11-16 ENCOUNTER — Telehealth (HOSPITAL_COMMUNITY): Payer: Self-pay

## 2019-11-16 MED ORDER — AMPHETAMINE-DEXTROAMPHETAMINE 30 MG PO TABS: 30.0000 mg | ORAL_TABLET | Freq: Two times a day (BID) | ORAL | 0 refills | Status: DC

## 2019-11-16 NOTE — Telephone Encounter (Signed)
Notified patient.

## 2019-11-16 NOTE — Telephone Encounter (Signed)
Patient called requesting a refill on his Adderall. It shows under current medications in the 4/5 ov note that he's taking it but it doesn't show it prescribed by you under the Medication tab. Can you refill this medication? Please review and advise. Thank you

## 2019-11-16 NOTE — Telephone Encounter (Signed)
Done

## 2019-11-25 ENCOUNTER — Ambulatory Visit: Payer: Medicaid Other | Admitting: Psychology

## 2019-11-28 ENCOUNTER — Ambulatory Visit
Admission: RE | Admit: 2019-11-28 | Discharge: 2019-11-28 | Disposition: A | Discharge status: Home / Self Care | Payer: Medicaid Other | Source: Ambulatory Visit | Attending: Diagnostic Neuroimaging | Admitting: Diagnostic Neuroimaging

## 2019-11-28 ENCOUNTER — Other Ambulatory Visit: Payer: Self-pay

## 2019-11-28 DIAGNOSIS — D320 Benign neoplasm of cerebral meninges: Secondary | ICD-10-CM

## 2019-11-28 MED ORDER — GADOBENATE DIMEGLUMINE 529 MG/ML IV SOLN
20.0000 mL | Freq: Once | INTRAVENOUS | Status: AC | PRN
  Administered 2019-11-28: 20 mL via INTRAVENOUS

## 2019-12-01 ENCOUNTER — Telehealth: Payer: Self-pay | Admitting: *Deleted

## 2019-12-01 DIAGNOSIS — R9089 Other abnormal findings on diagnostic imaging of central nervous system: Secondary | ICD-10-CM

## 2019-12-01 DIAGNOSIS — G43109 Migraine with aura, not intractable, without status migrainosus: Secondary | ICD-10-CM

## 2019-12-01 DIAGNOSIS — D320 Benign neoplasm of cerebral meninges: Secondary | ICD-10-CM

## 2019-12-01 NOTE — Telephone Encounter (Signed)
LVM requesting call back for results. 

## 2019-12-01 NOTE — Addendum Note (Signed)
Addended by: Minna Antis on: 12/01/2019 02:51 PM   Modules accepted: Orders

## 2019-12-01 NOTE — Telephone Encounter (Signed)
Phone rep checked office voicemail's, pt returned the call to [facility].  Please call.  This was at 10:04

## 2019-12-01 NOTE — Telephone Encounter (Addendum)
Spoke with patient and informe dihm the MRI brain/IAC scan shows small bone projections near left skull base. Dr Leta Baptist recommends a neurosurgery consult for evaluation. Patient agreed,  verbalized understanding, appreciation.

## 2019-12-09 ENCOUNTER — Other Ambulatory Visit: Payer: Self-pay | Admitting: Medical

## 2019-12-09 ENCOUNTER — Ambulatory Visit (INDEPENDENT_AMBULATORY_CARE_PROVIDER_SITE_OTHER): Payer: Medicaid Other | Admitting: Psychology

## 2019-12-09 DIAGNOSIS — F70 Mild intellectual disabilities: Secondary | ICD-10-CM | POA: Diagnosis not present

## 2019-12-09 DIAGNOSIS — F4323 Adjustment disorder with mixed anxiety and depressed mood: Secondary | ICD-10-CM

## 2019-12-09 DIAGNOSIS — F900 Attention-deficit hyperactivity disorder, predominantly inattentive type: Secondary | ICD-10-CM | POA: Diagnosis not present

## 2019-12-09 DIAGNOSIS — B360 Pityriasis versicolor: Secondary | ICD-10-CM

## 2019-12-23 ENCOUNTER — Ambulatory Visit (INDEPENDENT_AMBULATORY_CARE_PROVIDER_SITE_OTHER): Payer: Medicaid Other | Admitting: Psychology

## 2019-12-23 DIAGNOSIS — F70 Mild intellectual disabilities: Secondary | ICD-10-CM | POA: Diagnosis not present

## 2019-12-23 DIAGNOSIS — F4323 Adjustment disorder with mixed anxiety and depressed mood: Secondary | ICD-10-CM

## 2019-12-23 DIAGNOSIS — F900 Attention-deficit hyperactivity disorder, predominantly inattentive type: Secondary | ICD-10-CM

## 2020-01-06 ENCOUNTER — Ambulatory Visit: Payer: Medicaid Other | Admitting: Psychology

## 2020-01-20 ENCOUNTER — Ambulatory Visit (INDEPENDENT_AMBULATORY_CARE_PROVIDER_SITE_OTHER): Payer: Medicaid Other | Admitting: Psychology

## 2020-01-20 DIAGNOSIS — F70 Mild intellectual disabilities: Secondary | ICD-10-CM

## 2020-01-20 DIAGNOSIS — F900 Attention-deficit hyperactivity disorder, predominantly inattentive type: Secondary | ICD-10-CM

## 2020-01-20 DIAGNOSIS — F4323 Adjustment disorder with mixed anxiety and depressed mood: Secondary | ICD-10-CM | POA: Diagnosis not present

## 2020-02-03 ENCOUNTER — Ambulatory Visit: Payer: Medicaid Other | Admitting: Psychology

## 2020-02-17 ENCOUNTER — Ambulatory Visit (INDEPENDENT_AMBULATORY_CARE_PROVIDER_SITE_OTHER): Payer: Medicaid Other | Admitting: Psychology

## 2020-02-17 DIAGNOSIS — F4323 Adjustment disorder with mixed anxiety and depressed mood: Secondary | ICD-10-CM

## 2020-02-17 DIAGNOSIS — F79 Unspecified intellectual disabilities: Secondary | ICD-10-CM | POA: Diagnosis not present

## 2020-02-17 DIAGNOSIS — F900 Attention-deficit hyperactivity disorder, predominantly inattentive type: Secondary | ICD-10-CM

## 2020-03-02 ENCOUNTER — Ambulatory Visit: Payer: Medicaid Other | Admitting: Psychology

## 2020-03-07 ENCOUNTER — Other Ambulatory Visit: Payer: Self-pay | Admitting: Medical

## 2020-03-07 ENCOUNTER — Telehealth: Payer: Self-pay | Admitting: Medical

## 2020-03-07 MED ORDER — ONDANSETRON 4 MG PO TBDP: 4.0000 mg | ORAL_TABLET | Freq: Four times a day (QID) | ORAL | 0 refills | Status: DC | PRN

## 2020-03-07 MED ORDER — PANTOPRAZOLE SODIUM 40 MG PO TBEC: 40.0000 mg | DELAYED_RELEASE_TABLET | Freq: Every day | ORAL | 0 refills | Status: DC

## 2020-03-07 NOTE — Telephone Encounter (Signed)
I sent prescriptions for Zofran for nausea, pantoprazole for ulcer or acid reflux.  The last Adderall refill was from a psychiatrist which she should be seeing at this point.  So I recommend this be refilled by the psychiatrist

## 2020-03-07 NOTE — Telephone Encounter (Signed)
Pt called, he needs refill on Adderall and also "medication for his stomach", he could not remember name, states it starts with a P   [facility] [address]

## 2020-03-09 ENCOUNTER — Telehealth: Payer: Self-pay

## 2020-03-09 NOTE — Telephone Encounter (Signed)
If I am going to be working with him on mental issue he should be on a every 3 mo follow up.  So get in for visit

## 2020-03-09 NOTE — Telephone Encounter (Signed)
Pt called and states he needs you to fill his ADHD med now

## 2020-03-10 NOTE — Telephone Encounter (Signed)
Left message for pt to schedule appt

## 2020-03-13 ENCOUNTER — Other Ambulatory Visit: Payer: Self-pay | Admitting: Medical

## 2020-03-13 ENCOUNTER — Telehealth: Payer: Self-pay

## 2020-03-13 MED ORDER — AMPHETAMINE-DEXTROAMPHETAMINE 30 MG PO TABS: 30.0000 mg | ORAL_TABLET | Freq: Two times a day (BID) | ORAL | 0 refills | Status: DC

## 2020-03-13 NOTE — Telephone Encounter (Signed)
Rx sent.  I assume visit scheduled within next 30 days

## 2020-03-13 NOTE — Telephone Encounter (Signed)
Pt scheduled appt for med check,  Can he have refill on ADHD medication?

## 2020-03-15 NOTE — Telephone Encounter (Signed)
appt 03/21/20

## 2020-03-16 ENCOUNTER — Ambulatory Visit (INDEPENDENT_AMBULATORY_CARE_PROVIDER_SITE_OTHER): Payer: Medicaid Other | Admitting: Psychology

## 2020-03-16 DIAGNOSIS — F70 Mild intellectual disabilities: Secondary | ICD-10-CM

## 2020-03-16 DIAGNOSIS — F4323 Adjustment disorder with mixed anxiety and depressed mood: Secondary | ICD-10-CM | POA: Diagnosis not present

## 2020-03-16 DIAGNOSIS — F900 Attention-deficit hyperactivity disorder, predominantly inattentive type: Secondary | ICD-10-CM | POA: Diagnosis not present

## 2020-03-21 ENCOUNTER — Other Ambulatory Visit: Payer: Self-pay

## 2020-03-21 ENCOUNTER — Ambulatory Visit: Payer: Medicaid Other | Admitting: Medical

## 2020-03-21 ENCOUNTER — Encounter: Payer: Self-pay | Admitting: Medical

## 2020-03-21 VITALS — BP 132/98 | HR 94 | Ht 73.0 in | Wt 274.0 lb

## 2020-03-21 DIAGNOSIS — Z23 Encounter for immunization: Secondary | ICD-10-CM

## 2020-03-21 DIAGNOSIS — F988 Other specified behavioral and emotional disorders with onset usually occurring in childhood and adolescence: Secondary | ICD-10-CM

## 2020-03-21 DIAGNOSIS — F411 Generalized anxiety disorder: Secondary | ICD-10-CM | POA: Diagnosis not present

## 2020-03-21 DIAGNOSIS — F324 Major depressive disorder, single episode, in partial remission: Secondary | ICD-10-CM

## 2020-03-21 DIAGNOSIS — F819 Developmental disorder of scholastic skills, unspecified: Secondary | ICD-10-CM

## 2020-03-21 DIAGNOSIS — B360 Pityriasis versicolor: Secondary | ICD-10-CM

## 2020-03-21 DIAGNOSIS — L989 Disorder of the skin and subcutaneous tissue, unspecified: Secondary | ICD-10-CM | POA: Insufficient documentation

## 2020-03-21 MED ORDER — BUPROPION HCL ER (XL) 150 MG PO TB24
150.0000 mg | ORAL_TABLET | Freq: Every day | ORAL | 2 refills | Status: DC
Start: 2020-03-21 — End: 2020-06-27

## 2020-03-21 NOTE — Progress Notes (Signed)
Subjective: Chief Complaint  Patient presents with  . Medication Management    wants to increase Adderall-needs refill on Shampoo    Here for follow-up on mental health issues.  He just started back on Adderall.  He uses for focus and attention.  He still lives with his father.  His father feels like he does not have motivation like he needs.  He has been doing some auditions for some working parts.  Is an Pension scheme manager.  He does have a girlfriend currently.  He would like to go back on a medicine to help with anxiety and mood.  He denies any hallucinations or delusions.  He has had these in the past but none in recent months.  He does get irritated sometimes in people are talking about him under their breath, or when people confront him with things that are ridiculous.  For example lately he has had several people question his sexual identity.  He feels like that is his personal business and is irritated that people are asking him this question.  Otherwise he is still trying to work, trying to live his own life.  No other aggravating or relieving factors.  He would like a refill on the shampoo that he uses for fungus  He has a new growth on his left cheek that started a few months ago   No other complaint.  Past Medical History:  Diagnosis Date  . ADD (attention deficit disorder)   . Anxiety   . Depression   . Gastritis 03/2015   EGD Dr. [name]  . H/O echocardiogram 10/2007   normal stress echo, Dr. Jenkins Rouge  . Learning disability   . Obesity   . 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 Tinea corporis   . Wears glasses    ROS as in subjective   Objective: BP (!) 132/98   Pulse 94   Ht 6\' 1"  (1.854 m)   Wt 274 lb (124.3 kg)   SpO2 98%   BMI 36.15 kg/m   Gen: wd, wn, nad Psych: pleasant, good eye contact, answers questions appropriately Skin lesion left cheek 4 mm diameter raised, new Nodular somewhat pustular lesions within the  scalp    Assessment: Encounter Diagnoses  Name Primary?  . Attention deficit disorder, unspecified hyperactivity presence Yes  . Depression, major, single episode, in partial remission ([place])   . Generalized anxiety disorder   . Need for influenza vaccination   . Tinea versicolor   . Learning disability   . Skin lesion      Plan: ADHD, depression, anxiety-continue counseling, continue Adderall, add Wellbutrin.  Discussed risk and benefits of medication, proper use of medication.  We discussed prior visits.  He saw psychiatry earlier in the year but was lost to follow-up.  He would rather get his medications filled here.  We discussed that I am not a psychiatrist or a Social worker.  I will work with him but advise the more appropriate management would be through psychiatry.   tinea versicolor-continue shampoo for fungal infection periodically for face.  Can continue on the scalp once or twice per week.  Refer to dermatology for further eval and management  New skin lesion of face-refer to dermatology  Counseled on the influenza virus vaccine.  Vaccine information sheet given.  Influenza vaccine given after consent obtained.   Blythe was seen today for medication management.  Diagnoses and all orders for this visit:  Attention deficit disorder, unspecified hyperactivity presence  Depression, major, single  episode, in partial remission (HCC)  Generalized anxiety disorder  Need for influenza vaccination  Tinea versicolor  Learning disability  Skin lesion  Other orders -     Flu Vaccine QUAD 6+ mos PF IM (Fluarix Quad PF) -     buPROPion (WELLBUTRIN XL) 150 MG 24 hr tablet; Take 1 tablet (150 mg total) by mouth daily.   F/u with call back in 1 mo

## 2020-03-22 ENCOUNTER — Telehealth: Payer: Self-pay

## 2020-03-22 NOTE — Telephone Encounter (Signed)
Pt. Called stating that he has an apt. With Dermatology on 03/28/20 and he needs a referral put in for that due to his ins.

## 2020-03-22 NOTE — Progress Notes (Signed)
Patient was referred to [facility] dermatology previously. He is checking to see if he will need another referral or if he can just schedule the appointment.

## 2020-03-22 NOTE — Telephone Encounter (Signed)
Referral has been sent to [facility].

## 2020-03-30 ENCOUNTER — Ambulatory Visit: Payer: Medicaid Other | Admitting: Psychology

## 2020-04-27 ENCOUNTER — Ambulatory Visit (INDEPENDENT_AMBULATORY_CARE_PROVIDER_SITE_OTHER): Payer: Medicaid Other | Admitting: Psychology

## 2020-04-27 DIAGNOSIS — F4323 Adjustment disorder with mixed anxiety and depressed mood: Secondary | ICD-10-CM | POA: Diagnosis not present

## 2020-04-27 DIAGNOSIS — F900 Attention-deficit hyperactivity disorder, predominantly inattentive type: Secondary | ICD-10-CM

## 2020-04-27 DIAGNOSIS — F70 Mild intellectual disabilities: Secondary | ICD-10-CM

## 2020-05-02 ENCOUNTER — Ambulatory Visit: Payer: Medicaid Other | Admitting: Diagnostic Neuroimaging

## 2020-05-02 ENCOUNTER — Encounter: Payer: Self-pay | Admitting: Diagnostic Neuroimaging

## 2020-05-02 VITALS — BP 145/86 | HR 82 | Ht 73.0 in | Wt 277.2 lb

## 2020-05-02 DIAGNOSIS — D320 Benign neoplasm of cerebral meninges: Secondary | ICD-10-CM | POA: Diagnosis not present

## 2020-05-02 DIAGNOSIS — R9089 Other abnormal findings on diagnostic imaging of central nervous system: Secondary | ICD-10-CM

## 2020-05-02 DIAGNOSIS — G43109 Migraine with aura, not intractable, without status migrainosus: Secondary | ICD-10-CM

## 2020-05-02 MED ORDER — TOPIRAMATE 50 MG PO TABS: 50.0000 mg | ORAL_TABLET | Freq: Two times a day (BID) | ORAL | 12 refills | Status: DC

## 2020-05-02 MED ORDER — RIZATRIPTAN BENZOATE 10 MG PO TBDP
10.0000 mg | ORAL_TABLET | ORAL | 11 refills | Status: DC | PRN
Start: 2020-05-02 — End: 2020-06-27

## 2020-05-02 NOTE — Progress Notes (Signed)
[facility]  PATIENT: [name] DOB: Apr 14, 1986  REFERRING CLINICIAN: Carlena Hurl, PA-C HISTORY FROM: patient  REASON FOR VISIT: follow up   HISTORICAL  CHIEF COMPLAINT:  Chief Complaint  Patient presents with  . Migraine    Rm 7, 6 month FU "pressure on top of my head, pressuring my brain; Ibuprofen as needed with fair relief"     HISTORY OF PRESENT ILLNESS:    UPDATE (05/02/20, VRP): Since last visit, continues with headaches (2-3 per week). Symptoms are increasing. No alleviating or aggravating factors. Tolerating ibuprofen.    PRIOR HPI: 34 year old male here for evaluation of headaches.  Patient has had headaches since age 70 years old consisting of throbbing, vertex headaches, right-sided headaches, fatigue nausea sensitivity to light vertigo.  No specific triggering factors.  Symptom he sees floaters and spots.  Not having headaches to 3 times per week.  Patient also with history of learning disability and ADHD.  Also has history with depression, anxiety, auditory hallucinations.  Patient had MRI of the brain which showed small possible meningioma in the left CP angle.   REVIEW OF SYSTEMS: Full 14 system review of systems performed and negative with exception of: as per HPI.  ALLERGIES: Allergies  Allergen Reactions  . Grass Extracts [Gramineae Pollens] Swelling    HOME MEDICATIONS: Outpatient Medications Prior to Visit  Medication Sig Dispense Refill  . amphetamine-dextroamphetamine (ADDERALL) 30 MG tablet Take 1 tablet by mouth 2 (two) times daily. 60 tablet 0  . Cholecalciferol (D3 ADULT PO) Take by mouth.    . fluticasone (FLONASE) 50 MCG/ACT nasal spray Place 2 sprays into both nostrils daily. 16 g 6  . ketoconazole (NIZORAL) 2 % shampoo APPLY EXTERNALLY 2 TIMES A WEEK 120 mL 3  . pantoprazole (PROTONIX) 40 MG tablet Take 1 tablet (40 mg total) by mouth daily. 30 tablet 0  . albuterol (VENTOLIN HFA) 108 (90 Base)  MCG/ACT inhaler Inhale 2 puffs into the lungs every 6 (six) hours as needed for wheezing or shortness of breath. (Patient not taking: No sig reported) 6.7 g 0  . buPROPion (WELLBUTRIN XL) 150 MG 24 hr tablet Take 1 tablet (150 mg total) by mouth daily. (Patient not taking: Reported on 05/02/2020) 30 tablet 2  . Multiple Vitamins-Minerals (EMERGEN-C IMMUNE PLUS) PACK Take 1 tablet by mouth 2 (two) times daily. (Patient not taking: No sig reported) 10 each 0  . ondansetron (ZOFRAN ODT) 4 MG disintegrating tablet Take 1 tablet (4 mg total) by mouth every 6 (six) hours as needed for nausea or vomiting. (Patient not taking: No sig reported) 20 tablet 0   No facility-administered medications prior to visit.    PAST MEDICAL HISTORY: Past Medical History:  Diagnosis Date  . ADD (attention deficit disorder)   . Anxiety   . Depression   . Gastritis 03/2015   EGD Dr. [name]  . H/O echocardiogram 10/2007   normal stress echo, Dr. Jenkins Rouge  . Learning disability   . Obesity   . 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 Tinea corporis   . Wears glasses     PAST SURGICAL HISTORY: Past Surgical History:  Procedure Laterality Date  . ESOPHAGOGASTRODUODENOSCOPY  03/2015   gastritis; Dr. [name]    FAMILY HISTORY: Family History  Problem Relation Age of Onset  . Hypertension Mother   . Deep vein thrombosis Mother   . Multiple sclerosis Father   . Diabetes Paternal Aunt   . Heart disease  Paternal Uncle   . Prostate cancer Paternal Grandfather   . Cancer Paternal Grandmother        uterine  . Stroke Neg Hx     SOCIAL HISTORY: Social History   Socioeconomic History  . Marital status: Single    Spouse name: Not on file  . Number of children: 0  . Years of education: Not on file  . Highest education level: Associate degree: academic program  Occupational History    Comment: culinary arts  Tobacco Use  . Smoking status: Never Smoker  . Smokeless tobacco: Never  Used  Vaping Use  . Vaping Use: Never used  Substance and Sexual Activity  . Alcohol use: No  . Drug use: No  . Sexual activity: Yes    Partners: Female  Other Topics Concern  . Not on file  Social History Narrative   Lives with father.   Has degree from [employer] for [employer], does modeling and acting on the side.   Exercise- some weights, walking.  Diet - ok, avoids soda, tries to eat healthy.  04/2019   Social Determinants of Health   Financial Resource Strain: Not on file  Food Insecurity: Not on file  Transportation Needs: Not on file  Physical Activity: Not on file  Stress: Not on file  Social Connections: Not on file  Intimate Partner Violence: Not on file     PHYSICAL EXAM  GENERAL EXAM/CONSTITUTIONAL: Vitals:  Vitals:   05/02/20 1043  BP: (!) 145/86  Pulse: 82  Weight: 277 lb 3.2 oz (125.7 kg)  Height: 6\' 1"  (1.854 m)   Body mass index is 36.57 kg/m. Wt Readings from Last 3 Encounters:  05/02/20 277 lb 3.2 oz (125.7 kg)  03/21/20 274 lb (124.3 kg)  11/08/19 268 lb 3.2 oz (121.7 kg)    Patient is in no distress; well developed, nourished and groomed; neck is supple  CARDIOVASCULAR:  Examination of carotid arteries is normal; no carotid bruits  Regular rate and rhythm, no murmurs  Examination of peripheral vascular system by observation and palpation is normal  EYES:  Ophthalmoscopic exam of optic discs and posterior segments is normal; no papilledema or hemorrhages No exam data present  MUSCULOSKELETAL:  Gait, strength, tone, movements noted in Neurologic exam below  NEUROLOGIC: MENTAL STATUS:  No flowsheet data found.  awake, alert, oriented to person, place and time  recent and remote memory intact  normal attention and concentration  language fluent, comprehension intact, naming intact  fund of knowledge appropriate  CRANIAL NERVE:   2nd - no papilledema on fundoscopic exam  2nd, 3rd, 4th, 6th - pupils equal and reactive to  light, visual fields full to confrontation, extraocular muscles intact, no nystagmus  5th - facial sensation symmetric  7th - facial strength symmetric  8th - hearing intact  9th - palate elevates symmetrically, uvula midline  11th - shoulder shrug symmetric  12th - tongue protrusion midline  MOTOR:   normal bulk and tone, full strength in the BUE, BLE  SENSORY:   normal and symmetric to light touch, temperature, vibration  COORDINATION:   finger-nose-finger, fine finger movements normal  REFLEXES:   deep tendon reflexes present and symmetric  GAIT/STATION:   narrow based gait     DIAGNOSTIC DATA (LABS, IMAGING, TESTING) - I reviewed patient records, labs, notes, testing and imaging myself where available.  Lab Results  Component Value Date   WBC 9.0 07/14/2019   HGB 16.6 07/14/2019   HCT 49.8 07/14/2019  MCV 84.4 07/14/2019   PLT 276 07/14/2019      Component Value Date/Time   NA 138 07/14/2019 0034   NA 141 05/07/2019 0917   K 3.9 07/14/2019 0034   CL 104 07/14/2019 0034   CO2 24 07/14/2019 0034   GLUCOSE 96 07/14/2019 0034   BUN 13 07/14/2019 0034   BUN 10 05/07/2019 0917   CREATININE 1.04 07/14/2019 0034   CREATININE 1.03 07/21/2015 0001   CALCIUM 9.5 07/14/2019 0034   PROT 8.0 07/14/2019 0034   PROT 7.3 05/07/2019 0917   ALBUMIN 4.5 07/14/2019 0034   ALBUMIN 4.7 05/07/2019 0917   AST 24 07/14/2019 0034   ALT 42 07/14/2019 0034   ALKPHOS 45 07/14/2019 0034   BILITOT 0.7 07/14/2019 0034   BILITOT 0.4 05/07/2019 0917   GFRNONAA >60 07/14/2019 0034   GFRAA >60 07/14/2019 0034   Lab Results  Component Value Date   CHOL 203 (H) 05/07/2019   HDL 61 05/07/2019   LDLCALC 123 (H) 05/07/2019   TRIG 104 05/07/2019   CHOLHDL 3.3 05/07/2019   Lab Results  Component Value Date   HGBA1C 5.3 05/07/2019   No results found for: VITAMINB12 Lab Results  Component Value Date   TSH 0.644 05/07/2019     09/18/19 MRI brain (without) [I reviewed  images myself and agree with interpretation. -VRP]  - 9 mm dural based nodule along the left petrous ridge, focal and a possible calcified meningioma. There is projection into the left CP angle cistern in close to the root entry zone of the left trigeminal nerve, are headaches typical of trigeminal neuralgia? Consider postcontrast imaging.   11/28/19 MRI brain (with and without) demonstrating: -2 calcified lesions projecting from left petrous ridge of temporal bone measuring 6 and 9 mm in diameter.  These are in close proximity to the left trigeminal nerve and left cranial nerve VII/VIII complexes.  No abnormal enhancement noted.  May represent calcified meningiomas vs chondrosarcoma.     ASSESSMENT AND PLAN  34 y.o. year old male here with:  Dx:  1. Migraine with aura and without status migrainosus, not intractable   2. 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 Abnormal finding on MRI of brain      PLAN:  [facility] [place] -Stop or avoid smoking -Decrease or avoid caffeine / alcohol -Eat and sleep on a regular schedule -Exercise several times per week - start topiramate 50mg  at bedtime; after 1-2 weeks increase to 50mg  twice a day; drink plenty of water  MIGRAINE RESCUE  - ibuprofen, tylenol as needed - start rizatriptan (Maxalt) 10mg  as needed for breakthrough headache; may repeat x 1 after 2 hours; max 2 tabs per day or 8 per month  LEFT CP ANGLE CALCIFIED NODULES (likely incidental meningiomas) - follow up with neurosurgery  Meds ordered this encounter  Medications  . topiramate (TOPAMAX) 50 MG tablet    Sig: Take 1 tablet (50 mg total) by mouth 2 (two) times daily.    Dispense:  60 tablet    Refill:  12  . rizatriptan (MAXALT-MLT) 10 MG disintegrating tablet    Sig: Take 1 tablet (10 mg total) by mouth as needed for migraine. May repeat in 2 hours if needed    Dispense:  9 tablet    Refill:  11   Return in about 1 year  (around 05/02/2021).    Penni Bombard, MD 94/80/1655, 37:48 AM Certified in Neurology, Neurophysiology and Neuroimaging  [facility] [address] [place], [place] [address] [phone]

## 2020-05-02 NOTE — Patient Instructions (Signed)
  MIGRAINE PREVENTION  LIFESTYLE CHANGES -Stop or avoid smoking -Decrease or avoid caffeine / alcohol -Eat and sleep on a regular schedule -Exercise several times per week - start topiramate 50mg  at bedtime; after 1-2 weeks increase to 50mg  twice a day; drink plenty of water   MIGRAINE RESCUE  - ibuprofen, tylenol as needed - start rizatriptan (Maxalt) 10mg  as needed for breakthrough headache; may repeat x 1 after 2 hours; max 2 tabs per day or 8 per month

## 2020-05-08 ENCOUNTER — Other Ambulatory Visit: Payer: Self-pay

## 2020-05-08 ENCOUNTER — Ambulatory Visit: Payer: Medicaid Other | Admitting: Medical

## 2020-05-08 ENCOUNTER — Encounter: Payer: Self-pay | Admitting: Medical

## 2020-05-08 VITALS — BP 126/72 | HR 78 | Ht 73.0 in | Wt 273.6 lb

## 2020-05-08 DIAGNOSIS — Z1159 Encounter for screening for other viral diseases: Secondary | ICD-10-CM

## 2020-05-08 DIAGNOSIS — Z1329 Encounter for screening for other suspected endocrine disorder: Secondary | ICD-10-CM | POA: Insufficient documentation

## 2020-05-08 DIAGNOSIS — R0683 Snoring: Secondary | ICD-10-CM

## 2020-05-08 DIAGNOSIS — Z6836 Body mass index (BMI) 36.0-36.9, adult: Secondary | ICD-10-CM

## 2020-05-08 DIAGNOSIS — R718 Other abnormality of red blood cells: Secondary | ICD-10-CM | POA: Insufficient documentation

## 2020-05-08 DIAGNOSIS — Z131 Encounter for screening for diabetes mellitus: Secondary | ICD-10-CM | POA: Diagnosis not present

## 2020-05-08 DIAGNOSIS — Z0000 Encounter for general adult medical examination without abnormal findings: Secondary | ICD-10-CM | POA: Diagnosis not present

## 2020-05-08 DIAGNOSIS — G478 Other sleep disorders: Secondary | ICD-10-CM

## 2020-05-08 DIAGNOSIS — Z7185 Encounter for immunization safety counseling: Secondary | ICD-10-CM | POA: Diagnosis not present

## 2020-05-08 NOTE — Patient Instructions (Signed)
  Today you had a preventative care visit or wellness visit.    Topics today may have included healthy lifestyle, diet, exercise, preventative care, vaccinations, sick and well care, proper use of emergency dept and after hours care, as well as other concerns.     Recommendations: Continue to return yearly for your annual wellness and preventative care visits.  This gives [place] a chance to discuss healthy lifestyle, exercise, vaccinations, review your chart record, and perform screenings where appropriate.  I recommend you see your eye doctor yearly for routine vision care.  I recommend you see your dentist yearly for routine dental care including hygiene visits twice yearly.   Vaccination recommendations were reviewed  Advised HPV and covid vaccines He declines covid vaccine Up to date on flu and Td vaccine  You can go to the health department for HPV vaccine as insurance doesn't cover this here.   Screening for cancer: Colon cancer screening:  Age 34yo  Advised monthly testicular cancer screening  Skin cancer screening: Check your skin regularly for new changes, growing lesions, or other lesions of concern Come in for evaluation if you have skin lesions of concern.  Lung cancer screening: If you have a greater than 30 pack year history of tobacco use, then you qualify for lung cancer screening with a chest CT scan    Bone health: Get at least 150 minutes of aerobic exercise weekly Get weight bearing exercise at least once weekly   Heart health: Get at least 150 minutes of aerobic exercise weekly Limit alcohol It is important to maintain a healthy blood pressure and healthy cholesterol numbers   Separate significant issues discussed: Obesity - counseled on exercise, weight loss efforts  Skin issues - continue follow up with dermatology  ADD, mood - doing ok on current medication.   Seeing counseling.  Sleep issues - consider CBT through counseling, neurology  referral

## 2020-05-08 NOTE — Progress Notes (Signed)
Subjective:   HPI  [name] is a 34 y.o. male who presents for Chief Complaint  Patient presents with  . Annual Exam    Physical with fasting labs     Patient Care Team: Yoshie Kosel, Camelia Eng, PA-C as PCP - General (Family Medicine) Jyren Cerasoli, Camelia Eng, PA-C as Referring Physician (Family Medicine) Sees dentist  Sees eye doctor  Sees dermatology in [place]  Concerns: Doing fine  Knees feel warm at times, but no pain, no paresthesias  Regarding prior elevated RBCs, no hx/o lung or heart disease.  He does snore.  Prior sleep study 2017 not diagnostic as he never really had enough sleep that night for good data.   Compliant with medications for focus and mood  Sees dermatology for fungal and skin issues  Reviewed their medical, surgical, family, social, medication, and allergy history and updated chart as appropriate.  Past Medical History:  Diagnosis Date  . ADD (attention deficit disorder)   . Anxiety   . Depression   . Gastritis 03/2015   EGD Dr. [name]  . H/O echocardiogram 10/2007   normal stress echo, Dr. Jenkins Rouge  . Learning disability   . Obesity   . 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 Tinea corporis   . Wears glasses     Past Surgical History:  Procedure Laterality Date  . ESOPHAGOGASTRODUODENOSCOPY  03/2015   gastritis; Dr. [name]    Social History   Socioeconomic History  . Marital status: Single    Spouse name: Not on file  . Number of children: 0  . Years of education: Not on file  . Highest education level: Associate degree: academic program  Occupational History    Comment: culinary arts  Tobacco Use  . Smoking status: Never Smoker  . Smokeless tobacco: Never Used  Vaping Use  . Vaping Use: Never used  Substance and Sexual Activity  . Alcohol use: No  . Drug use: No  . Sexual activity: Yes    Partners: Female  Other Topics Concern  . Not on file  Social History Narrative   Lives with father.    Has degree from [employer] for [employer], does modeling and acting on the side.  Still has to take certifying exam for culinary arts.  Exercise- some weights, walking.  Diet - ok, avoids soda, tries to eat healthy.  04/2020   Social Determinants of Health   Financial Resource Strain: Not on file  Food Insecurity: Not on file  Transportation Needs: Not on file  Physical Activity: Not on file  Stress: Not on file  Social Connections: Not on file  Intimate Partner Violence: Not on file    Family History  Problem Relation Age of Onset  . Hypertension Mother   . Deep vein thrombosis Mother   . Multiple sclerosis Father   . Diabetes Paternal Aunt   . Heart disease Paternal Uncle   . Prostate cancer Paternal Grandfather   . Cancer Paternal Grandmother        uterine  . Stroke Neg Hx      Current Outpatient Medications:  .  amphetamine-dextroamphetamine (ADDERALL) 30 MG tablet, Take 1 tablet by mouth 2 (two) times daily., Disp: 60 tablet, Rfl: 0 .  Cholecalciferol (D3 ADULT PO), Take by mouth., Disp: , Rfl:  .  ketoconazole (NIZORAL) 2 % shampoo, APPLY EXTERNALLY 2 TIMES A WEEK, Disp: 120 mL, Rfl: 3 .  Multiple Vitamins-Minerals (EMERGEN-C IMMUNE PLUS) PACK, Take 1 tablet by  mouth 2 (two) times daily., Disp: 10 each, Rfl: 0 .  pantoprazole (PROTONIX) 40 MG tablet, Take 1 tablet (40 mg total) by mouth daily., Disp: 30 tablet, Rfl: 0 .  rizatriptan (MAXALT-MLT) 10 MG disintegrating tablet, Take 1 tablet (10 mg total) by mouth as needed for migraine. May repeat in 2 hours if needed, Disp: 9 tablet, Rfl: 11 .  topiramate (TOPAMAX) 50 MG tablet, Take 1 tablet (50 mg total) by mouth 2 (two) times daily., Disp: 60 tablet, Rfl: 12 .  albuterol (VENTOLIN HFA) 108 (90 Base) MCG/ACT inhaler, Inhale 2 puffs into the lungs every 6 (six) hours as needed for wheezing or shortness of breath. (Patient not taking: No sig reported), Disp: 6.7 g, Rfl: 0 .  buPROPion (WELLBUTRIN XL) 150 MG 24 hr tablet, Take 1  tablet (150 mg total) by mouth daily. (Patient not taking: No sig reported), Disp: 30 tablet, Rfl: 2 .  fluticasone (FLONASE) 50 MCG/ACT nasal spray, Place 2 sprays into both nostrils daily. (Patient not taking: Reported on 05/08/2020), Disp: 16 g, Rfl: 6 .  ondansetron (ZOFRAN ODT) 4 MG disintegrating tablet, Take 1 tablet (4 mg total) by mouth every 6 (six) hours as needed for nausea or vomiting. (Patient not taking: No sig reported), Disp: 20 tablet, Rfl: 0  Allergies  Allergen Reactions  . Grass Extracts [Gramineae Pollens] Swelling    Review of Systems Constitutional: -fever, -chills, -sweats, -unexpected weight change, -decreased appetite, -fatigue Allergy: -sneezing, -itching, -congestion Dermatology: -changing moles, --rash, -lumps ENT: -runny nose, -ear pain, -sore throat, -hoarseness, -sinus pain, -teeth pain, - ringing in ears, -hearing loss, -nosebleeds Cardiology: -chest pain, -palpitations, -swelling, -difficulty breathing when lying flat, -waking up short of breath Respiratory: -cough, -shortness of breath, -difficulty breathing with exercise or exertion, -wheezing, -coughing up blood Gastroenterology: -abdominal pain, -nausea, -vomiting, -diarrhea, -constipation, -blood in stool, -changes in bowel movement, -difficulty swallowing or eating Hematology: -bleeding, -bruising  Musculoskeletal: -joint aches, -muscle aches, -joint swelling, -back pain, -neck pain, -cramping, -changes in gait Ophthalmology: denies vision changes, eye redness, itching, discharge Urology: -burning with urination, -difficulty urinating, -blood in urine, -urinary frequency, -urgency, -incontinence Neurology: -headache, -weakness, -tingling, -numbness, -memory loss, -falls, -dizziness Psychology: -depressed mood, -agitation, -sleep problems Male GU: no testicular mass, pain, no lymph nodes swollen, no swelling, no rash.      Objective:  BP 126/72   Pulse 78   Ht 6\' 1"  (1.854 m)   Wt 273 lb 9.6 oz  (124.1 kg)   SpO2 97%   BMI 36.10 kg/m   Wt Readings from Last 3 Encounters:  05/08/20 273 lb 9.6 oz (124.1 kg)  05/02/20 277 lb 3.2 oz (125.7 kg)  03/21/20 274 lb (124.3 kg)    General appearance: alert, no distress, WD/WN, African American male Skin: facial splotchy coloration of face and scalp, otherwise no worrisome lesions today HEENT: normocephalic, conjunctiva/corneas normal, sclerae anicteric, PERRLA, EOMi, nares patent, no discharge or erythema, pharynx normal Oral cavity: MMM, tongue normal, teeth in good repair Neck: supple, no lymphadenopathy, no thyromegaly, no masses, normal ROM, no bruits Chest: non tender, normal shape and expansion Heart: RRR, normal S1, S2, no murmurs Lungs: CTA bilaterally, no wheezes, rhonchi, or rales Abdomen: +bs, soft, non tender, non distended, no masses, no hepatomegaly, no splenomegaly, no bruits Back: non tender, normal ROM, no scoliosis Musculoskeletal: upper extremities non tender, no obvious deformity, normal ROM throughout, lower extremities non tender, no obvious deformity, normal ROM throughout Extremities: no edema, no cyanosis, no clubbing Pulses: 2+ symmetric,  upper and lower extremities, normal cap refill Neurological: alert, oriented x 3, CN2-12 intact, strength normal upper extremities and lower extremities, sensation normal throughout, DTRs 2+ throughout, no cerebellar signs, gait normal Psychiatric: normal affect, behavior normal, pleasant  GU: normal male external genitalia,circumcised, nontender, no masses, no hernia, no lymphadenopathy Rectal: deferred    Assessment and Plan :   Encounter Diagnoses  Name Primary?  . Encounter for health maintenance examination in adult Yes  . Vaccine counseling   . Encounter for hepatitis C screening test for low risk patient   . Screening for diabetes mellitus   . Elevated red blood cell count   . Poor sleep pattern   . Snoring   . Screening for thyroid disorder   . BMI  36.0-36.9,adult     Physical exam - discussed and counseled on healthy lifestyle, diet, exercise, preventative care, vaccinations, sick and well care, proper use of emergency dept and after hours care, and addressed their concerns.    Today you had a preventative care visit or wellness visit.    Topics today may have included healthy lifestyle, diet, exercise, preventative care, vaccinations, sick and well care, proper use of emergency dept and after hours care, as well as other concerns.     Recommendations: Continue to return yearly for your annual wellness and preventative care visits.  This gives [place] a chance to discuss healthy lifestyle, exercise, vaccinations, review your chart record, and perform screenings where appropriate.  I recommend you see your eye doctor yearly for routine vision care.  I recommend you see your dentist yearly for routine dental care including hygiene visits twice yearly.   Vaccination recommendations were reviewed  advised HPV and covid vaccines He declines covid vaccine Up to date on flu and Td vaccine   Screening for cancer: Colon cancer screening:  Age 73yo  Advised monthly testicular cancer screening  Skin cancer screening: Check your skin regularly for new changes, growing lesions, or other lesions of concern Come in for evaluation if you have skin lesions of concern.  Lung cancer screening: If you have a greater than 30 pack year history of tobacco use, then you qualify for lung cancer screening with a chest CT scan    Bone health: Get at least 150 minutes of aerobic exercise weekly Get weight bearing exercise at least once weekly   Heart health: Get at least 150 minutes of aerobic exercise weekly Limit alcohol It is important to maintain a healthy blood pressure and healthy cholesterol numbers   Separate significant issues discussed: Obesity - counseled on exercise, weight loss efforts  Skin issues - c/t follow up with  dermatology  ADD, mood - doing ok on current medication.   Seeing counseling.  Sleep issues - consider CBT through counseling, neurology referral         Melanie was seen today for annual exam.  Diagnoses and all orders for this visit:  Encounter for health maintenance examination in adult -     Hepatitis C antibody -     Basic metabolic panel -     Hemoglobin A1c -     TSH -     CBC with Differential/Platelet  Vaccine counseling  Encounter for hepatitis C screening test for low risk patient -     Hepatitis C antibody  Screening for diabetes mellitus -     Hemoglobin A1c  Elevated red blood cell count -     CBC with Differential/Platelet  Poor sleep pattern -  CBC with Differential/Platelet  Snoring  Screening for thyroid disorder -     TSH  BMI 36.0-36.9,adult    Follow-up pending labs, yearly for physical

## 2020-05-09 LAB — CBC WITH DIFFERENTIAL/PLATELET
Basophils Absolute: 0 10*3/uL (ref 0.0–0.2)
Basos: 1 %
EOS (ABSOLUTE): 0.1 10*3/uL (ref 0.0–0.4)
Eos: 1 %
Hematocrit: 47.9 % (ref 37.5–51.0)
Hemoglobin: 16.4 g/dL (ref 13.0–17.7)
Immature Grans (Abs): 0 10*3/uL (ref 0.0–0.1)
Immature Granulocytes: 0 %
Lymphocytes Absolute: 3.9 10*3/uL — ABNORMAL HIGH (ref 0.7–3.1)
Lymphs: 65 %
MCH: 27.9 pg (ref 26.6–33.0)
MCHC: 34.2 g/dL (ref 31.5–35.7)
MCV: 82 fL (ref 79–97)
Monocytes Absolute: 0.5 10*3/uL (ref 0.1–0.9)
Monocytes: 8 %
Neutrophils Absolute: 1.5 10*3/uL (ref 1.4–7.0)
Neutrophils: 25 %
Platelets: 327 10*3/uL (ref 150–450)
RBC: 5.88 x10E6/uL — ABNORMAL HIGH (ref 4.14–5.80)
RDW: 12.6 % (ref 11.6–15.4)
WBC: 6 10*3/uL (ref 3.4–10.8)

## 2020-05-09 LAB — BASIC METABOLIC PANEL
BUN/Creatinine Ratio: 11 (ref 9–20)
BUN: 14 mg/dL (ref 6–20)
CO2: 23 mmol/L (ref 20–29)
Calcium: 10.1 mg/dL (ref 8.7–10.2)
Chloride: 104 mmol/L (ref 96–106)
Creatinine, Ser: 1.22 mg/dL (ref 0.76–1.27)
GFR calc Af Amer: 89 mL/min/{1.73_m2} (ref >59)
GFR calc non Af Amer: 77 mL/min/{1.73_m2} (ref >59)
Glucose: 99 mg/dL (ref 65–99)
Potassium: 4.7 mmol/L (ref 3.5–5.2)
Sodium: 142 mmol/L (ref 134–144)

## 2020-05-09 LAB — HEMOGLOBIN A1C
Est. average glucose Bld gHb Est-mCnc: 111 mg/dL
Hgb A1c MFr Bld: 5.5 % (ref 4.8–5.6)

## 2020-05-09 LAB — TSH: TSH: 1.69 u[IU]/mL (ref 0.450–4.500)

## 2020-05-09 LAB — HEPATITIS C ANTIBODY: Hep C Virus Ab: <0.1 s/co ratio (ref 0.0–0.9)

## 2020-05-10 ENCOUNTER — Other Ambulatory Visit: Payer: Self-pay

## 2020-05-10 DIAGNOSIS — G478 Other sleep disorders: Secondary | ICD-10-CM

## 2020-05-25 ENCOUNTER — Ambulatory Visit: Payer: Medicaid Other | Admitting: Psychology

## 2020-06-07 ENCOUNTER — Encounter: Payer: Self-pay | Admitting: Medical

## 2020-06-07 ENCOUNTER — Other Ambulatory Visit: Payer: Self-pay

## 2020-06-07 ENCOUNTER — Ambulatory Visit: Payer: Medicaid Other | Admitting: Medical

## 2020-06-07 VITALS — BP 128/82 | HR 75 | Ht 73.0 in | Wt 273.0 lb

## 2020-06-07 DIAGNOSIS — R52 Pain, unspecified: Secondary | ICD-10-CM

## 2020-06-07 DIAGNOSIS — L089 Local infection of the skin and subcutaneous tissue, unspecified: Secondary | ICD-10-CM | POA: Diagnosis not present

## 2020-06-07 DIAGNOSIS — Z20822 Contact with and (suspected) exposure to covid-19: Secondary | ICD-10-CM | POA: Diagnosis not present

## 2020-06-07 LAB — POC COVID19 BINAXNOW: SARS Coronavirus 2 Ag: NEGATIVE

## 2020-06-07 MED ORDER — DOXYCYCLINE HYCLATE 100 MG PO TABS
100.0000 mg | ORAL_TABLET | Freq: Two times a day (BID) | ORAL | 0 refills | Status: DC
Start: 2020-06-07 — End: 2020-06-27

## 2020-06-07 NOTE — Progress Notes (Signed)
Subjective:  [name] is a 35 y.o. male who presents for Chief Complaint  Patient presents with  . Insect Bite    Bite on back      Here for skin infection/bite.   Noticed this 2 weeks ago after coming home from beach.  gradually got worse.  He was able to get some pus out of this yesterday.  Today it feels better.  He denies any prior cyst in the same location.  He thinks he got bit by a spider last week while at the beach.  No other aggravating or relieving factors.    He also wanted to get tested for COVID.  He found out a few days ago that someone he was around briefly last week tested positive for COVID.  He has some body aches and slight congestion.  No other symptoms.  No other c/o.  The following portions of the patient's history were reviewed and updated as appropriate: allergies, current medications, past family history, past medical history, past social history, past surgical history and problem list.  ROS Otherwise as in subjective above  Objective: BP 128/82   Pulse 75   Ht 6\' 1"  (1.854 m)   Wt 273 lb (123.8 kg)   SpO2 96%   BMI 36.02 kg/m   General appearance: alert, no distress, well developed, well nourished Skin: Left upper back/lateral superior chest wall with 2 cm diameter area of slight induration without erythema or fluctuance.  Mildly tender.  No discharge. HEENT unremarkable    Assessment: Encounter Diagnoses  Name Primary?  . Skin infection Yes  . Exposure to confirmed case of COVID-19   . Body aches      Plan: Skin infection- seems to be healing.  No induration or fluctuance warranting I&D today.  Begin doxycycline, warm compresses, and if worse or not improving over the next 4 to 5 days let me know  COVID exposure, body aches- screening labs today for COVID.  Discussed supportive care, isolation until he gets test results   Mir was seen today for insect bite.  Diagnoses and all orders for this visit:  Skin  infection Comments: left lateral chest/upper back Orders: -     Novel Coronavirus, NAA ([facility]) -     Cancel: POC COVID-19 BinaxNow  Exposure to confirmed case of COVID-19 -     POC COVID-19 -     Cancel: Novel Coronavirus, NAA ([facility]) -     Novel Coronavirus, NAA ([facility]) -     Cancel: POC COVID-19 BinaxNow  Body aches -     POC COVID-19 -     Cancel: Novel Coronavirus, NAA ([facility]) -     Novel Coronavirus, NAA ([facility]) -     Cancel: POC COVID-19 BinaxNow  Other orders -     doxycycline (VIBRA-TABS) 100 MG tablet; Take 1 tablet (100 mg total) by mouth 2 (two) times daily.   Follow up: pending labs

## 2020-06-08 ENCOUNTER — Ambulatory Visit (INDEPENDENT_AMBULATORY_CARE_PROVIDER_SITE_OTHER): Payer: Medicaid Other | Admitting: Psychology

## 2020-06-08 DIAGNOSIS — F4323 Adjustment disorder with mixed anxiety and depressed mood: Secondary | ICD-10-CM | POA: Diagnosis not present

## 2020-06-08 DIAGNOSIS — F70 Mild intellectual disabilities: Secondary | ICD-10-CM | POA: Diagnosis not present

## 2020-06-08 DIAGNOSIS — F900 Attention-deficit hyperactivity disorder, predominantly inattentive type: Secondary | ICD-10-CM | POA: Diagnosis not present

## 2020-06-09 LAB — NOVEL CORONAVIRUS, NAA: SARS-CoV-2, NAA: NOT DETECTED

## 2020-06-09 LAB — SARS-COV-2, NAA 2 DAY TAT

## 2020-06-22 ENCOUNTER — Ambulatory Visit: Payer: Medicaid Other | Admitting: Psychology

## 2020-06-27 ENCOUNTER — Ambulatory Visit: Payer: Medicaid Other | Admitting: Neurology

## 2020-06-27 ENCOUNTER — Encounter: Payer: Self-pay | Admitting: Neurology

## 2020-06-27 VITALS — BP 158/102 | HR 73 | Ht 73.0 in | Wt 273.0 lb

## 2020-06-27 DIAGNOSIS — G4721 Circadian rhythm sleep disorder, delayed sleep phase type: Secondary | ICD-10-CM | POA: Diagnosis not present

## 2020-06-27 DIAGNOSIS — Z6836 Body mass index (BMI) 36.0-36.9, adult: Secondary | ICD-10-CM | POA: Diagnosis not present

## 2020-06-27 DIAGNOSIS — F901 Attention-deficit hyperactivity disorder, predominantly hyperactive type: Secondary | ICD-10-CM

## 2020-06-27 DIAGNOSIS — G479 Sleep disorder, unspecified: Secondary | ICD-10-CM | POA: Diagnosis not present

## 2020-06-27 DIAGNOSIS — R519 Headache, unspecified: Secondary | ICD-10-CM

## 2020-06-27 MED ORDER — TRAZODONE HCL 50 MG PO TABS: ORAL_TABLET | ORAL | 3 refills | Status: DC

## 2020-06-27 NOTE — Progress Notes (Signed)
SLEEP MEDICINE CLINIC    Provider:  Larey Seat, MD  Primary Care Physician:  Carlena Hurl, PA-C [place] [address]     Referring Provider: Carlena Hurl, [address] [place],  [place] [address]          Chief Complaint according to patient   Patient presents with:    . New Patient (Initial Visit)     takes until in the morning 6/7 am before falls asleep then sleeps until 3/4 pm. 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 Had a SS 2017 but was unable to sleep and provide data      HISTORY OF PRESENT ILLNESS:  [name] is a 35 y.o. year old  African- American male patient of Dr Gladstone Lighter and referred for a sleep consultation by PA Tysinger. seen  on 06/27/2020. Chief concern according to patient : " I am not able to sleep, I am always looking around, and easily woken up- I have a hard time with concentration , easily distracted. I am falling asleep when the sun rises and sleep into the afternoon".     [name] is a left-handed [place] American male with chronic insomnia, delayed sleep phase syndrome, sleeping in daytime . 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   The patient had the first sleep study in the year 2017  with no sleep time recorded. Advise was given to addre delayed sleep phase syndrome and address with psychology.    Sleep relevant medical history:  No ENT surgery, no Tonsillectomy.    Family medical /sleep history: no other family member on CPAP with OSA, insomnia, not aware of any psychiatric illness.  Social history:  Patient is working as a Engineer, maintenance (IT), Scientist, research (life sciences)- looking for work. Unemployed since 04-2018. He lives in a household alone.  Tobacco use-  never .  ETOH use ; none , Caffeine intake in form of Soda( 1 a day)  Regular exercise ; weight lifting. Hobbies :cooking.     Sleep habits are as follows: The patient's dinner time is between 11 PM. The patient takes a hot shower, goes to bed at 12 PM and is awake - for hours, once asleep he  continues to sleep for 5-6  hours, wakes rarely for  bathroom breaks.  He has racing thoughts, incoherent thoughts. Watches T in bed, or reads.  The preferred sleep position is supine , with the support of 1 pillow.  Dreams are reportedly rare.  3-4  PM is the usual rise time. The patient wakes up spontaneously.  He reports not feeling refreshed or restored from sleep, with symptoms such as dry mouth, morning headaches, and residual fatigue.  Naps are taken infrequent.    Review of Systems: Out of a complete 14 system review, the patient complains of only the following symptoms, and all other reviewed systems are negative.:  Fatigue, sleepiness , snoring, fragmented sleep,elayed sleep phase syndrome.    How likely are you to doze in the following situations: 0 = not likely, 1 = slight chance, 2 = moderate chance, 3 = high chance   Sitting and Reading? Watching Television? Sitting inactive in a public place (theater or meeting)? As a passenger in a car for an hour without a break?  Lying down in the afternoon when circumstances permit? Sitting and talking to someone? Sitting quietly after lunch without alcohol? In a car, while stopped for a few minutes in traffic?   Total = 5/ 24 points   FSS endorsed at 35/ 63 points.   Social History   Socioeconomic History  . Marital status: Single    Spouse name: Not on file  . Number of children: 0  . Years of education: Not on file  . Highest education level: Associate degree: academic program  Occupational History    Comment: culinary arts  Tobacco Use  . Smoking status: Never Smoker  . Smokeless tobacco: Never Used  Vaping Use  . Vaping Use:  Never used  Substance and Sexual Activity  . Alcohol use: No  . Drug use: No  . Sexual activity: Yes    Partners: Female  Other Topics Concern  . Not on file  Social History Narrative   Lives with father.   Has degree from [employer] for [employer], does modeling and acting on the side.  Still has to take certifying exam for culinary arts.  Exercise- some weights, walking.  Diet - ok, avoids soda, tries to eat healthy.  04/2020   Social Determinants of Health   Financial Resource Strain: Not on file  Food Insecurity: Not on file  Transportation Needs: Not on file  Physical Activity: Not on file  Stress: Not on file  Social Connections: Not on file    Family History  Problem Relation Age of Onset  . Hypertension Mother   . Deep vein thrombosis Mother   . Multiple sclerosis Father   . Diabetes Paternal Aunt   . Heart disease Paternal Uncle   . Prostate cancer Paternal Grandfather   . Cancer Paternal Grandmother        uterine  . Stroke Neg Hx     Past Medical History:  Diagnosis Date  . ADD (attention deficit disorder)   . Anxiety   . Depression   . Gastritis 03/2015   EGD Dr. [name]  . H/O echocardiogram 10/2007   normal stress echo, Dr. Jenkins [place]  . Learning disability   . Obesity   . 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 Tinea corporis   . Wears glasses     Past Surgical History:  Procedure Laterality Date  . ESOPHAGOGASTRODUODENOSCOPY  03/2015   gastritis; Dr. [name]     Current Outpatient Medications on File Prior to Visit  Medication Sig Dispense Refill  . amphetamine-dextroamphetamine (ADDERALL) 30 MG tablet Take 1 tablet by mouth 2 (two) times daily. 60 tablet 0  . Multiple Vitamins-Minerals (EMERGEN-C IMMUNE PLUS) PACK Take 1 tablet by mouth 2 (two) times daily. 10 each 0  . pantoprazole (PROTONIX) 40 MG tablet Take 1 tablet (40 mg total) by mouth daily. 30 tablet 0  . VITAMIN D PO Take 1 tablet by mouth daily.     No current  facility-administered medications on file prior to visit.    Allergies  Allergen Reactions  . Grass Extracts [Gramineae Pollens] Swelling    Physical exam:  Today's Vitals   06/27/20 1132  BP: (!) 158/102  Pulse: 73  Weight: 273 lb (123.8 kg)  Height: 6\' 1"  (1.854 m)   Body mass index is 36.02 kg/m.   Wt Readings from Last 3 Encounters:  06/27/20 273 lb (123.8 kg)  06/07/20 273 lb (123.8 kg)  05/08/20 273 lb 9.6 oz (124.1 kg)  Ht Readings from Last 3 Encounters:  06/27/20 6\' 1"  (1.854 m)  06/07/20 6\' 1"  (1.854 m)  05/08/20 6\' 1"  (1.854 m)      General: The patient is awake, alert and appears not in acute distress. The patient is groomed, pleasant and cooperative . Head: Normocephalic, atraumatic. Neck is supple. Mallampati 3 plus ,  neck circumference: 19.5 inches . Nasal airflow congested .   Retrognathia is seen.  Dental status:  Biological . Cardiovascular:  Regular rate and cardiac rhythm by pulse,  without distended neck veins. Respiratory: Lungs are clear to auscultation.  Skin:  Without evidence of ankle edema, or rash. Trunk: The patient's posture is erect.   Neurologic exam : The patient is awake and alert, oriented to place and time.   Memory subjective described as intact.  Attention span & concentration ability appears limited.   Speech is fluent,  without  dysarthria, dysphonia or aphasia.  Mood and affect are appropriate.   Cranial nerves: no loss of smell or taste reported  Pupils are equal and briskly reactive to light.  Funduscopic exam deferred.   Extraocular movements in vertical and horizontal planes were intact and without nystagmus. No Diplopia. Visual fields by finger perimetry are intact. Hearing was intact to soft voice and finger rubbing.    Facial sensation intact to fine touch.  Facial motor strength is symmetric and tongue and uvula move midline.  Neck ROM : rotation, tilt and flexion extension were normal for age and shoulder  shrug was symmetrical.    Motor exam:  Symmetric bulk, tone and ROM.   Normal tone without cog wheeling, symmetric grip strength .   Sensory:  Fine touch, pinprick and vibration were tested  and  normal.  Proprioception tested in the upper extremities was normal.   Coordination: Rapid alternating movements in the fingers/hands were of normal speed.  The Finger-to-nose maneuver was intact without evidence of ataxia, dysmetria or tremor.   Gait and station: Patient could rise unassisted from a seated position, walked without assistive device.  Stance is of normal width/ base.  Toe and heel walk were deferred.  Deep tendon reflexes: in the  upper and lower extremities are symmetric and intact.  Babinski response was deferred .      After spending a total time of  60 minutes face to face and additional time for physical and neurologic examination, review of laboratory studies,  personal review of imaging studies, reports and results of other testing and review of referral information / records as far as provided in visit, I have established the following assessments:  Delayed sleep phase. 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  On the other hand he is unable to initiate sleep when others are sleeping at night.  He is not sure that he snores he seems to be not thinking that he may have apnea but he does have a history of migraines with aura, occasional morning headaches, and dry mouth when he wakes up.  It may also be related to stimulants that his sleep phase has been delayed as metabolites of these medications often postpone sleep onset.   ADHD and psychological factors of insmnia- He has a history of attention deficit hyperactivity disorder and the medication is usually used to treat this disorder can affect sleep latency.  We discussed briefly some sleep hygiene  issues hot  shower, the room should be cool dark and quiet no TV no screen night of warm light such as an orange glow nightlight for a low wattage in can do some light bulb to read may help to initiate sleep.  I also like for him to try to advance his bedtime by 1 hour each week that means going at midnight to bed for the coming week taking 5 mg or less of melatonin hopefully helping him to ease into sleep.  In addition it is important to get up at a set time in the morning routines of the essence of better sleep habits.  So bedtime at midnight would correlate with a rise time no later than 8:30 AM and he should expose himself today night early on in the day.  He may have caffeinated drink at that time and find at least a spot in his apartment that has good daylight exposure.  That will help him to feel more awake.  Regular exercise is also encouraged.    He takes 30 mg immediate release adderall every 12 hours- we will change that to 9 AM and 15 hours.    OSA :  I do have a suspicion that he may have apnea by high-grade Mallampati larger neck and an elevated body mass index.  BMI currently is 36.  I like for him to have an home sleep test as he will not fit into the sleep labs with a opening hours.  And the home sleep test can rule out obstructive sleep apnea and he would be invited to pick the device up here at our sleep lab use it over a 24-hour.  As long as he feels he can sleep and then returned the device the next morning of business day.    My Plan is to proceed with:  1) HST .  Trazodone,  change in adderall intake times.   NIH brochure of better sleep.    I would like to thank Andrey Spearman ,MD and Glade Lloyd Camelia Eng, [address] [place],  [place] [address] for allowing me to meet with and to take care of this pleasant patient.   In short, [name] is to follow up through our NP within 3 month.   .  Electronically signed by: Larey Seat, MD 06/27/2020 11:39  AM  [facility] Board certified by The [employer] of Sleep Medicine and Diplomate of the [employer] of Sleep Medicine. Board certified In Neurology through the [place], Fellow of the [employer] of Neurology. Medical Director of [facility].

## 2020-06-27 NOTE — Patient Instructions (Signed)
Quality Sleep Information, Adult Quality sleep is important for your mental and physical health. It also improves your quality of life. Quality sleep means you:  Are asleep for most of the time you are in bed.  Fall asleep within 30 minutes.  Wake up no more than once a night.  Are awake for no longer than 20 minutes if you do wake up during the night. Most adults need 7-8 hours of quality sleep each night. How can poor sleep affect me? If you do not get enough quality sleep, you may have:  Mood swings.  Daytime sleepiness.  Confusion.  Decreased reaction time.  Sleep disorders, such as insomnia and sleep apnea.  Difficulty with: ? Solving problems. ? Coping with stress. ? Paying attention. These issues may affect your performance and productivity at work, school, and at home. Lack of sleep may also put you at higher risk for accidents, suicide, and risky behaviors. If you do not get quality sleep you may also be at higher risk for several health problems, including:  Infections.  Type 2 diabetes.  Heart disease.  High blood pressure.  Obesity.  Worsening of long-term conditions, like arthritis, kidney disease, depression, Parkinson's disease, and epilepsy. What actions can I take to get more quality sleep?  Stick to a sleep schedule. Go to sleep and wake up at about the same time each day. Do not try to sleep less on weekdays and make up for lost sleep on weekends. This does not work.  Try to get about 30 minutes of exercise on most days. Do not exercise 2-3 hours before going to bed.  Limit naps during the day to 30 minutes or less.  Do not use any products that contain nicotine or tobacco, such as cigarettes or e-cigarettes. If you need help quitting, ask your health care provider.  Do not drink caffeinated beverages for at least 8 hours before going to bed. Coffee, tea, and some sodas contain caffeine.  Do not drink alcohol close to bedtime.  Do not eat  large meals close to bedtime.  Do not take naps in the late afternoon.  Try to get at least 30 minutes of sunlight every day. Morning sunlight is best.  Make time to relax before bed. Reading, listening to music, or taking a hot bath promotes quality sleep.  Make your bedroom a place that promotes quality sleep. Keep your bedroom dark, quiet, and at a comfortable room temperature. Make sure your bed is comfortable. Take out sleep distractions like TV, a computer, smartphone, and bright lights.  If you are lying awake in bed for longer than 20 minutes, get up and do a relaxing activity until you feel sleepy.  Work with your health care provider to treat medical conditions that may affect sleeping, such as: ? Nasal obstruction. ? Snoring. ? Sleep apnea and other sleep disorders.  Talk to your health care provider if you think any of your prescription medicines may cause you to have difficulty falling or staying asleep.  If you have sleep problems, talk with a sleep consultant. If you think you have a sleep disorder, talk with your health care provider about getting evaluated by a specialist.      Where to find more information  National Sleep Foundation website: https://sleepfoundation.org  National Heart, Lung, and Blood Institute (NHLBI): www.nhlbi.nih.gov/files/docs/public/sleep/healthy_sleep.pdf  Centers for Disease Control and Prevention (CDC): www.cdc.gov/sleep/index.html Contact a health care provider if you:  Have trouble getting to sleep or staying asleep.  Often wake   up very early in the morning and cannot get back to sleep.  Have daytime sleepiness.  Have daytime sleep attacks of suddenly falling asleep and sudden muscle weakness (narcolepsy).  Have a tingling sensation in your legs with a strong urge to move your legs (restless legs syndrome).  Stop breathing briefly during sleep (sleep apnea).  Think you have a sleep disorder or are taking a medicine that is  affecting your quality of sleep. Summary  Most adults need 7-8 hours of quality sleep each night.  Getting enough quality sleep is an important part of health and well-being.  Make your bedroom a place that promotes quality sleep and avoid things that may cause you to have poor sleep, such as alcohol, caffeine, smoking, and large meals.  Talk to your health care provider if you have trouble falling asleep or staying asleep. This information is not intended to replace advice given to you by your health care provider. Make sure you discuss any questions you have with your health care provider. Document Revised: 08/13/2017 Document Reviewed: 08/13/2017 Elsevier Patient Education  2021 [employer] your intake time for adderall 30 mg to AM and early afternoon, none later than 5 PM !   Take trazodone at 11.30 PM to help falling asleep   Trazodone Tablets What is this medicine? TRAZODONE (TRAZ oh done) is used to treat depression. This medicine may be used for other purposes; ask your health care provider or pharmacist if you have questions. COMMON BRAND NAME(S): Desyrel What should I tell my health care provider before I take this medicine? They need to know if you have any of these conditions:  attempted suicide or thinking about it  bipolar disorder  bleeding problems  glaucoma  heart disease, or previous heart attack  irregular heart beat  kidney or liver disease  low levels of sodium in the blood  an unusual or allergic reaction to trazodone, other medicines, foods, dyes or preservatives  pregnant or trying to get pregnant  breast-feeding How should I use this medicine? Take this medicine by mouth with a glass of water. Follow the directions on the prescription label. Take this medicine shortly after a meal or a light snack. Take your medicine at regular intervals. Do not take your medicine more often than directed. Do not stop taking this medicine suddenly  except upon the advice of your doctor. Stopping this medicine too quickly may cause serious side effects or your condition may worsen. A special MedGuide will be given to you by the pharmacist with each prescription and refill. Be sure to read this information carefully each time. Talk to your pediatrician regarding the use of this medicine in children. Special care may be needed. Overdosage: If you think you have taken too much of this medicine contact a poison control center or emergency room at once. NOTE: This medicine is only for you. Do not share this medicine with others. What if I miss a dose? If you miss a dose, take it as soon as you can. If it is almost time for your next dose, take only that dose. Do not take double or extra doses. What may interact with this medicine? Do not take this medicine with any of the following medications:  certain medicines for fungal infections like fluconazole, itraconazole, ketoconazole, posaconazole, voriconazole  cisapride  dronedarone  linezolid  MAOIs like Carbex, Eldepryl, Marplan, Nardil, and Parnate  mesoridazine  methylene blue (injected into a vein)  pimozide  saquinavir  thioridazine This medicine may also interact with the following medications:  alcohol  antiviral medicines for HIV or AIDS  aspirin and aspirin-like medicines  barbiturates like phenobarbital  certain medicines for blood pressure, heart disease, irregular heart beat  certain medicines for depression, anxiety, or psychotic disturbances  certain medicines for migraine headache like almotriptan, eletriptan, frovatriptan, naratriptan, rizatriptan, sumatriptan, zolmitriptan  certain medicines for seizures like carbamazepine and phenytoin  certain medicines for sleep  certain medicines that treat or prevent blood clots like dalteparin, enoxaparin, warfarin  digoxin  fentanyl  lithium  NSAIDS, medicines for pain and inflammation, like ibuprofen or  naproxen  other medicines that prolong the QT interval (cause an abnormal heart rhythm) like dofetilide  rasagiline  supplements like St. John's wort, kava kava, valerian  tramadol  tryptophan This list may not describe all possible interactions. Give your health care provider a list of all the medicines, herbs, non-prescription drugs, or dietary supplements you use. Also tell them if you smoke, drink alcohol, or use illegal drugs. Some items may interact with your medicine. What should I watch for while using this medicine? Tell your doctor if your symptoms do not get better or if they get worse. Visit your doctor or health care professional for regular checks on your progress. Because it may take several weeks to see the full effects of this medicine, it is important to continue your treatment as prescribed by your doctor. Patients and their families should watch out for new or worsening thoughts of suicide or depression. Also watch out for sudden changes in feelings such as feeling anxious, agitated, panicky, irritable, hostile, aggressive, impulsive, severely restless, overly excited and hyperactive, or not being able to sleep. If this happens, especially at the beginning of treatment or after a change in dose, call your health care professional. Dennis Bast may get drowsy or dizzy. Do not drive, use machinery, or do anything that needs mental alertness until you know how this medicine affects you. Do not stand or sit up quickly, especially if you are an older patient. This reduces the risk of dizzy or fainting spells. Alcohol may interfere with the effect of this medicine. Avoid alcoholic drinks. This medicine may cause dry eyes and blurred vision. If you wear contact lenses you may feel some discomfort. Lubricating drops may help. See your eye doctor if the problem does not go away or is severe. Your mouth may get dry. Chewing sugarless gum, sucking hard candy and drinking plenty of water may help.  Contact your doctor if the problem does not go away or is severe. What side effects may I notice from receiving this medicine? Side effects that you should report to your doctor or health care professional as soon as possible:  allergic reactions like skin rash, itching or hives, swelling of the face, lips, or tongue  elevated mood, decreased need for sleep, racing thoughts, impulsive behavior  confusion  fast, irregular heartbeat  feeling faint or lightheaded, falls  feeling agitated, angry, or irritable  loss of balance or coordination  painful or prolonged erections  restlessness, pacing, inability to keep still  suicidal thoughts or other mood changes  tremors  trouble sleeping  seizures  unusual bleeding or bruising Side effects that usually do not require medical attention (report to your doctor or health care professional if they continue or are bothersome):  change in sex drive or performance  change in appetite or weight  constipation  headache  muscle aches or pains  nausea This list  may not describe all possible side effects. Call your doctor for medical advice about side effects. You may report side effects to FDA at 1-800-FDA-1088. Where should I keep my medicine? Keep out of the reach of children. Store at room temperature between 15 and 30 degrees C (59 to 86 degrees F). Protect from light. Keep container tightly closed. Throw away any unused medicine after the expiration date. NOTE: This sheet is a summary. It may not cover all possible information. If you have questions about this medicine, talk to your doctor, pharmacist, or health care provider.  2021 Elsevier/Gold Standard (2020-03-27 14:46:11) ing asleep.

## 2020-06-29 ENCOUNTER — Telehealth: Payer: Self-pay

## 2020-06-29 NOTE — Telephone Encounter (Signed)
LVM for pt to call me back to schedule sleep study  

## 2020-07-06 ENCOUNTER — Ambulatory Visit (INDEPENDENT_AMBULATORY_CARE_PROVIDER_SITE_OTHER): Payer: Medicaid Other | Admitting: Psychology

## 2020-07-06 DIAGNOSIS — F4323 Adjustment disorder with mixed anxiety and depressed mood: Secondary | ICD-10-CM

## 2020-07-06 DIAGNOSIS — F70 Mild intellectual disabilities: Secondary | ICD-10-CM | POA: Diagnosis not present

## 2020-07-06 DIAGNOSIS — F900 Attention-deficit hyperactivity disorder, predominantly inattentive type: Secondary | ICD-10-CM | POA: Diagnosis not present

## 2020-07-06 DIAGNOSIS — F3181 Bipolar II disorder: Secondary | ICD-10-CM | POA: Diagnosis not present

## 2020-07-17 ENCOUNTER — Ambulatory Visit: Payer: Medicaid Other | Admitting: Neurology

## 2020-07-17 DIAGNOSIS — F419 Anxiety disorder, unspecified: Secondary | ICD-10-CM

## 2020-07-17 DIAGNOSIS — R519 Headache, unspecified: Secondary | ICD-10-CM

## 2020-07-17 DIAGNOSIS — G4733 Obstructive sleep apnea (adult) (pediatric): Secondary | ICD-10-CM | POA: Diagnosis not present

## 2020-07-17 DIAGNOSIS — G479 Sleep disorder, unspecified: Secondary | ICD-10-CM

## 2020-07-17 DIAGNOSIS — F32A Depression, unspecified: Secondary | ICD-10-CM

## 2020-07-17 DIAGNOSIS — G4721 Circadian rhythm sleep disorder, delayed sleep phase type: Secondary | ICD-10-CM

## 2020-07-17 DIAGNOSIS — Z6836 Body mass index (BMI) 36.0-36.9, adult: Secondary | ICD-10-CM

## 2020-07-17 DIAGNOSIS — F901 Attention-deficit hyperactivity disorder, predominantly hyperactive type: Secondary | ICD-10-CM

## 2020-07-20 ENCOUNTER — Ambulatory Visit: Payer: Medicaid Other | Admitting: Psychology

## 2020-07-20 NOTE — Progress Notes (Signed)
   [facility] TEST ( HST Watch PAT)  STUDY DATA  Available on 07/19/20  DOB: 1985/11/10  MRN: [id number]  ORDERING CLINICIAN: Larey Seat, MD   REFERRING CLINICIAN: Tysinger, Camelia Eng, PA-C   CLINICAL INFORMATION/HISTORY: [name] a 35 y.o. year old  African- American male patientof Dr Gladstone Lighter and referred for a sleep consultation by PA Tysinger, for which he was seen on 06/27/2020. Chiefconcern : " I am not able to sleep, I am always looking around, and easily woken up- I have a hard time with concentration , feel easily distracted. I am falling asleep when the sun rises and sleep into the afternoon".  [name] isa left-handed African American male with chronic insomnia, delayed sleep phase syndrome, mostly sleeping in daytime . 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  The patient had attempted a sleep study in the year 2017  with no sleep time being recorded.  Advise was given to address his delayed sleep phase syndrome and address Anxiety with psychology/ psychiatry.  Epworth sleepiness score: 5/24. BMI: 36.2 kg/m Neck Circumference: 19.5"  FINDINGS:  Total Record Time (hours, min): 10 h 1 min Total Sleep Time (hours, min):  8 h 11 min Percent REM (%):    32.15 %   Calculated pAHI (per hour): 31.9       REM pAHI: 34.5    NREM pAHI: 30.6 RDI: 37.5/h   Oxygen Saturation (%) Mean: 95  Minimum oxygen saturation (%):         87   O2 Saturation Range (%): 87-99  O2Saturation (minutes) <=88%: 0.1 min   Pulse Mean (bpm):    66  Pulse Range was 45-113 bpm   IMPRESSION:This HST documented a severe degree of OSA (obstructive sleep apnea) with additional RDI arousals, likely through Snoring and Upper airway resistance. There was no  clinically significant hypoxia noted.    RECOMMENDATION: I would prefer to address this degree of OSA with positive airway pressure therapy, unless the patient feels opposed to this.  His BMI excludes the option of  inspire therapy and his AHI is too high to be significantly reduced by a dental device.  I will order CPAP autotitration with an interface that has to be fitted - please consider nasal mask or pillow in spite of snoring when claustrophobia is present. Settings from 6-18 cm water, with 2 cm EPR under heated humidity.   The main risk treatment is weight loss.    INTERPRETING PHYSICIAN:  Larey Seat, MD  [facility] and [facility] Sleep Board certified by The [employer] of Sleep Medicine and Fellow of the [employer] of Neurology. Medical Director of [employer].

## 2020-07-29 NOTE — Procedures (Signed)
[facility] TEST ( HST Watch PAT)  STUDY DATA  Available on 07/19/20  DOB: 11-15-1985  MRN: [id number]  ORDERING CLINICIAN: Larey Seat, MD   REFERRING CLINICIAN: Tysinger, Camelia Eng, PA-C   CLINICAL INFORMATION/HISTORY: Jason Closson Rutledgeis a 35 y.o. year old  African- American male patientof Dr Gladstone Lighter and referred for a sleep consultation by PA Tysinger, for which he was seen on 06/27/2020. Chiefconcern : " I am not able to sleep, I am always looking around, and easily woken up- I have a hard time with concentration , feel easily distracted. I am falling asleep when the sun rises and sleep into the afternoon".  [name] isa left-handed African American male with chronic insomnia, delayed sleep phase syndrome, mostly sleeping in daytime . 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  The patient had attempted a sleep study in the year 2017  with no sleep time being recorded.  Advise was given to address his delayed sleep phase syndrome and address Anxiety with psychology/ psychiatry.  Epworth sleepiness score: 5/24. BMI: 36.2 kg/m Neck Circumference: 19.5"  FINDINGS:  Total Record Time (hours, min): 10 h 1 min Total Sleep Time (hours, min):  8 h 11 min Percent REM (%):    32.15 %   Calculated pAHI (per hour): 31.9       REM pAHI: 34.5    NREM pAHI: 30.6 RDI: 37.5/h   Oxygen Saturation (%) Mean: 95  Minimum oxygen saturation (%):         87   O2 Saturation Range (%): 87-99  O2Saturation (minutes) <=88%: 0.1 min   Pulse Mean (bpm):    66  Pulse Range was 45-113 bpm   IMPRESSION:This HST documented a severe degree of OSA (obstructive sleep apnea) with additional RDI arousals, likely through Snoring and Upper airway resistance. There was no clinically  significant hypoxia noted.    RECOMMENDATION: I would prefer to address this degree of OSA with positive airway pressure therapy, unless the patient feels opposed to this.  His BMI excludes the option of  inspire therapy and his AHI is too high to be significantly reduced by a dental device.  I will order CPAP autotitration with an interface that has to be fitted - please consider nasal mask or pillow in spite of snoring when claustrophobia is present. Settings from 6-18 cm water, with 2 cm EPR under heated humidity.   The main risk treatment is weight loss.    INTERPRETING PHYSICIAN:  Larey Seat, MD  [facility] and [facility] Sleep Board certified by The [employer] of Sleep Medicine and Fellow of the [employer] of Neurology. Medical Director of [employer].

## 2020-07-29 NOTE — Progress Notes (Signed)
IMPRESSION:This HST documented a severe degree of OSA (obstructive sleep apnea) with additional RDI arousals, likely through Snoring and Upper Airway Resistance. There was no clinically significant hypoxia noted.    RECOMMENDATION: I would prefer to address this degree of OSA with positive airway pressure therapy, unless the patient feels opposed to this.  His BMI excludes the option of  inspire therapy and his AHI is too high to be significantly reduced by a dental device.  I will order CPAP autotitration with an interface that has to be fitted - please consider nasal mask or pillow in spite of snoring when claustrophobia is present. Settings from 6-18 cm water, with 2 cm EPR under heated humidity.  The main OSA risk of obesity is treated by weight loss.

## 2020-07-29 NOTE — Addendum Note (Signed)
Addended by: Larey Seat on: 07/29/2020 02:53 PM   Modules accepted: Orders

## 2020-08-01 ENCOUNTER — Telehealth: Payer: Self-pay | Admitting: Neurology

## 2020-08-01 ENCOUNTER — Encounter: Payer: Self-pay | Admitting: Neurology

## 2020-08-01 NOTE — Telephone Encounter (Signed)
-----   Message from Larey Seat, MD sent at 07/29/2020  2:53 PM EST ----- IMPRESSION:This HST documented a severe degree of OSA (obstructive sleep apnea) with additional RDI arousals, likely through Snoring and Upper Airway Resistance. There was no clinically significant hypoxia noted.    RECOMMENDATION: I would prefer to address this degree of OSA with positive airway pressure therapy, unless the patient feels opposed to this.  His BMI excludes the option of  inspire therapy and his AHI is too high to be significantly reduced by a dental device.  I will order CPAP autotitration with an interface that has to be fitted - please consider nasal mask or pillow in spite of snoring when claustrophobia is present. Settings from 6-18 cm water, with 2 cm EPR under heated humidity.  The main OSA risk of obesity is treated by weight loss.

## 2020-08-01 NOTE — Telephone Encounter (Signed)
Called patient to discuss sleep study results. No answer at this time. LVM for the patient to call back.   

## 2020-08-02 NOTE — Telephone Encounter (Signed)
Pt returned call.D I advised pt that Dr. Brett Fairy reviewed their sleep study results and found that pt has sleep apnea. Dr. Brett Fairy recommends that pt starts auto CPAP. I reviewed PAP compliance expectations with the pt. Pt is agreeable to starting a CPAP. I advised pt that an order will be sent to a DME, [facility], and [facility] will call the pt within about one week after they file with the pt's insurance. [facility] will show the pt how to use the machine, fit for masks, and troubleshoot the CPAP if needed. A follow up appt was made for insurance purposes with Debbora Presto, NP on June 27,2022 at 10 am. Pt verbalized understanding to arrive 15 minutes early and bring their CPAP. A letter with all of this information in it will be mailed to the pt as a reminder. I verified with the pt that the address we have on file is correct. Pt verbalized understanding of results. Pt had no questions at this time but was encouraged to call back if questions arise. I have sent the order to [facility] and have received confirmation that they have received the order.

## 2020-08-03 ENCOUNTER — Ambulatory Visit: Payer: Medicaid Other | Admitting: Psychology

## 2020-08-17 ENCOUNTER — Ambulatory Visit (INDEPENDENT_AMBULATORY_CARE_PROVIDER_SITE_OTHER): Payer: Medicaid Other | Admitting: Psychology

## 2020-08-17 DIAGNOSIS — F4323 Adjustment disorder with mixed anxiety and depressed mood: Secondary | ICD-10-CM | POA: Diagnosis not present

## 2020-08-31 ENCOUNTER — Ambulatory Visit: Payer: Medicaid Other | Admitting: Psychology

## 2020-09-14 ENCOUNTER — Ambulatory Visit (INDEPENDENT_AMBULATORY_CARE_PROVIDER_SITE_OTHER): Payer: Medicaid Other | Admitting: Psychology

## 2020-09-14 DIAGNOSIS — F900 Attention-deficit hyperactivity disorder, predominantly inattentive type: Secondary | ICD-10-CM | POA: Diagnosis not present

## 2020-09-14 DIAGNOSIS — F70 Mild intellectual disabilities: Secondary | ICD-10-CM | POA: Diagnosis not present

## 2020-09-14 DIAGNOSIS — F4323 Adjustment disorder with mixed anxiety and depressed mood: Secondary | ICD-10-CM | POA: Diagnosis not present

## 2020-09-28 ENCOUNTER — Ambulatory Visit: Payer: Medicaid Other | Admitting: Psychology

## 2020-10-10 ENCOUNTER — Ambulatory Visit (INDEPENDENT_AMBULATORY_CARE_PROVIDER_SITE_OTHER): Payer: Medicaid Other

## 2020-10-10 ENCOUNTER — Other Ambulatory Visit: Payer: Self-pay

## 2020-10-10 DIAGNOSIS — Z23 Encounter for immunization: Secondary | ICD-10-CM

## 2020-10-12 ENCOUNTER — Ambulatory Visit (INDEPENDENT_AMBULATORY_CARE_PROVIDER_SITE_OTHER): Payer: Medicaid Other | Admitting: Psychology

## 2020-10-12 DIAGNOSIS — F70 Mild intellectual disabilities: Secondary | ICD-10-CM

## 2020-10-12 DIAGNOSIS — F4323 Adjustment disorder with mixed anxiety and depressed mood: Secondary | ICD-10-CM

## 2020-10-12 DIAGNOSIS — F900 Attention-deficit hyperactivity disorder, predominantly inattentive type: Secondary | ICD-10-CM | POA: Diagnosis not present

## 2020-10-22 ENCOUNTER — Other Ambulatory Visit: Payer: Self-pay | Admitting: Medical

## 2020-10-24 ENCOUNTER — Telehealth: Payer: Self-pay

## 2020-10-24 ENCOUNTER — Other Ambulatory Visit: Payer: Self-pay | Admitting: Medical

## 2020-10-24 MED ORDER — AMPHETAMINE-DEXTROAMPHETAMINE 30 MG PO TABS: 30.0000 mg | ORAL_TABLET | Freq: Two times a day (BID) | ORAL | 0 refills | Status: DC

## 2020-10-24 MED ORDER — PANTOPRAZOLE SODIUM 40 MG PO TBEC: 40.0000 mg | DELAYED_RELEASE_TABLET | Freq: Every day | ORAL | 0 refills | Status: DC

## 2020-10-24 NOTE — Telephone Encounter (Signed)
Schedule med check now.  With this medication should have f/u q4-6 mo

## 2020-10-24 NOTE — Telephone Encounter (Signed)
Pt. Called stating he needs a refill on his Adderall and his pantoprazole, his last apt was 06/07/20 and next apt is 05/09/21. [facility] ON [address]

## 2020-10-25 ENCOUNTER — Ambulatory Visit: Payer: Medicaid Other | Admitting: Psychology

## 2020-10-25 NOTE — Telephone Encounter (Signed)
I called pt. And got him scheduled on 11/01/20 for med check apt.

## 2020-10-26 ENCOUNTER — Ambulatory Visit: Payer: Medicaid Other | Admitting: Psychology

## 2020-10-31 DIAGNOSIS — Z0271 Encounter for disability determination: Secondary | ICD-10-CM

## 2020-11-01 ENCOUNTER — Ambulatory Visit: Payer: Medicaid Other | Admitting: Medical

## 2020-11-01 ENCOUNTER — Other Ambulatory Visit: Payer: Self-pay

## 2020-11-01 ENCOUNTER — Encounter: Payer: Self-pay | Admitting: Medical

## 2020-11-01 VITALS — BP 122/88 | HR 71 | Ht 73.0 in | Wt 270.0 lb

## 2020-11-01 DIAGNOSIS — G4733 Obstructive sleep apnea (adult) (pediatric): Secondary | ICD-10-CM

## 2020-11-01 DIAGNOSIS — F819 Developmental disorder of scholastic skills, unspecified: Secondary | ICD-10-CM | POA: Diagnosis not present

## 2020-11-01 DIAGNOSIS — F411 Generalized anxiety disorder: Secondary | ICD-10-CM | POA: Diagnosis not present

## 2020-11-01 DIAGNOSIS — F901 Attention-deficit hyperactivity disorder, predominantly hyperactive type: Secondary | ICD-10-CM

## 2020-11-01 DIAGNOSIS — Z23 Encounter for immunization: Secondary | ICD-10-CM | POA: Diagnosis not present

## 2020-11-01 DIAGNOSIS — G479 Sleep disorder, unspecified: Secondary | ICD-10-CM

## 2020-11-01 MED ORDER — TRAZODONE HCL 50 MG PO TABS: 25.0000 mg | ORAL_TABLET | Freq: Every day | ORAL | 3 refills | Status: DC

## 2020-11-01 MED ORDER — AMPHETAMINE-DEXTROAMPHETAMINE 30 MG PO TABS: 30.0000 mg | ORAL_TABLET | Freq: Two times a day (BID) | ORAL | 0 refills | Status: DC

## 2020-11-01 MED ORDER — AMPHETAMINE-DEXTROAMPHETAMINE 30 MG PO TABS
30.0000 mg | ORAL_TABLET | Freq: Every day | ORAL | 0 refills | Status: DC
Start: 2021-01-01 — End: 2021-05-07

## 2020-11-01 MED ORDER — AMPHETAMINE-DEXTROAMPHETAMINE 30 MG PO TABS
30.0000 mg | ORAL_TABLET | Freq: Every day | ORAL | 0 refills | Status: DC
Start: 2020-12-01 — End: 2022-08-07

## 2020-11-01 MED ORDER — RISPERIDONE 1 MG PO TABS: 0.5000 mg | ORAL_TABLET | Freq: Two times a day (BID) | ORAL | 2 refills | Status: DC

## 2020-11-01 NOTE — Progress Notes (Signed)
Subjective: Chief Complaint  Patient presents with   Medication Management    Pt here for med check. No concerns    Here for follow-up on mental health issues.  He has a history of ADD, depression, anxiety and prior history of hallucinations and delusions.  He continues on Adderall 30 mg twice daily.  This does help with focus and attention.  In recent months mood has been ok. He lives with his father.  He just found out that he has other biological brother he wasn't aware of.  He notes that his father recently told him about this.  These brothers are older than him.  He has not met them.   He is not sure about meeting them.  He found out that they were adopted by some teachers in the past.    He has hx/o delusions and hallucinations.  He still hears voices sometimes, but no delusions.   No major mood swings.  These voices don't speak violent or worrisome talk.   No thoughts of SI or HI.    Hasn't been taking Trazodone 50 mg at night for sleep, but would like refill on this.   He does need some help with sleep.    Saw neurology in 06/2020, diagnosed with sleep apnea, and still pending CPAP as CPAP supplies have been on backorder.    Here for 2nd covid vaccine today.  He saw psychiatrist Maudry Mayhew last year briefly.  No other complaint.  Past Medical History:  Diagnosis Date   ADD (attention deficit disorder)    Anxiety    Depression    Gastritis 03/2015   EGD Dr. [name]   H/O echocardiogram 10/2007   normal stress echo, Dr. 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   Attention  deficit hyperactivity disorder (ADHD), predominantly hyperactive type Yes   Sleep disturbance    Generalized anxiety disorder    Learning disability    OSA (obstructive sleep apnea)    Need for COVID-19 vaccine       Plan: Reviewed PHQ9 and Mood questionnaires today  ADHD, depression, anxiety, hx/o mood disorder - begin back on Risperidone which he has used in past briefly.  Discussed risks/benefits of medicaiton.  C/t Adderall.    Counseled on [employer] Covid virus vaccine.  Vaccine information sheet given.  Covid vaccine given after consent obtained.  OSA - f/u with neurology as planned, start CPAP as soon as supplies are available as there has been a national back order  Insomnia, sleep disturbance - continue trazodone for sleep  Akin was seen today for medication management.  Diagnoses and all orders for this visit:  Attention deficit hyperactivity disorder (ADHD), predominantly hyperactive type  Sleep disturbance  Generalized anxiety disorder  Learning disability  OSA (obstructive sleep apnea)  Need for COVID-19 vaccine  Other orders -     traZODone (DESYREL) 50 MG tablet; Take 0.5-1 tablets (25-50 mg total) by mouth at bedtime. At 11 .30 PM po. -  amphetamine-dextroamphetamine (ADDERALL) 30 MG tablet; Take 1 tablet by mouth 2 (two) times daily. -     amphetamine-dextroamphetamine (ADDERALL) 30 MG tablet; Take 1 tablet by mouth daily. -     amphetamine-dextroamphetamine (ADDERALL) 30 MG tablet; Take 1 tablet by mouth daily. -     risperiDONE (RISPERDAL) 1 MG tablet; Take 0.5 tablets (0.5 mg total) by mouth 2 (two) times daily. Increase to 21m BID after 2 weeks   Recheck 353mo

## 2020-11-02 NOTE — Addendum Note (Signed)
Addended by: Edgar Frisk on: 11/02/2020 09:30 AM   Modules accepted: Orders

## 2020-11-09 ENCOUNTER — Other Ambulatory Visit: Payer: Self-pay

## 2020-11-09 ENCOUNTER — Telehealth: Payer: Self-pay

## 2020-11-09 ENCOUNTER — Ambulatory Visit (INDEPENDENT_AMBULATORY_CARE_PROVIDER_SITE_OTHER): Payer: Medicaid Other | Admitting: Psychology

## 2020-11-09 DIAGNOSIS — F4323 Adjustment disorder with mixed anxiety and depressed mood: Secondary | ICD-10-CM

## 2020-11-09 NOTE — Telephone Encounter (Signed)
Called LVM, doesn't seem to have started cpap. Left message to see if he has received his cpap.may need to r/s appt or inquire if he wants to start treatment.

## 2020-11-13 ENCOUNTER — Ambulatory Visit: Payer: Self-pay | Admitting: Family Medicine

## 2020-11-23 ENCOUNTER — Ambulatory Visit: Payer: Medicaid Other | Admitting: Psychology

## 2020-12-03 IMAGING — DX DG CHEST 1V PORT
1 series · 1 of 1 positions shown · non-contrast
Comparison: 06/03/2017

CLINICAL DATA: Cough for several days

EXAM:
PORTABLE CHEST 1 VIEW

[chest ap]
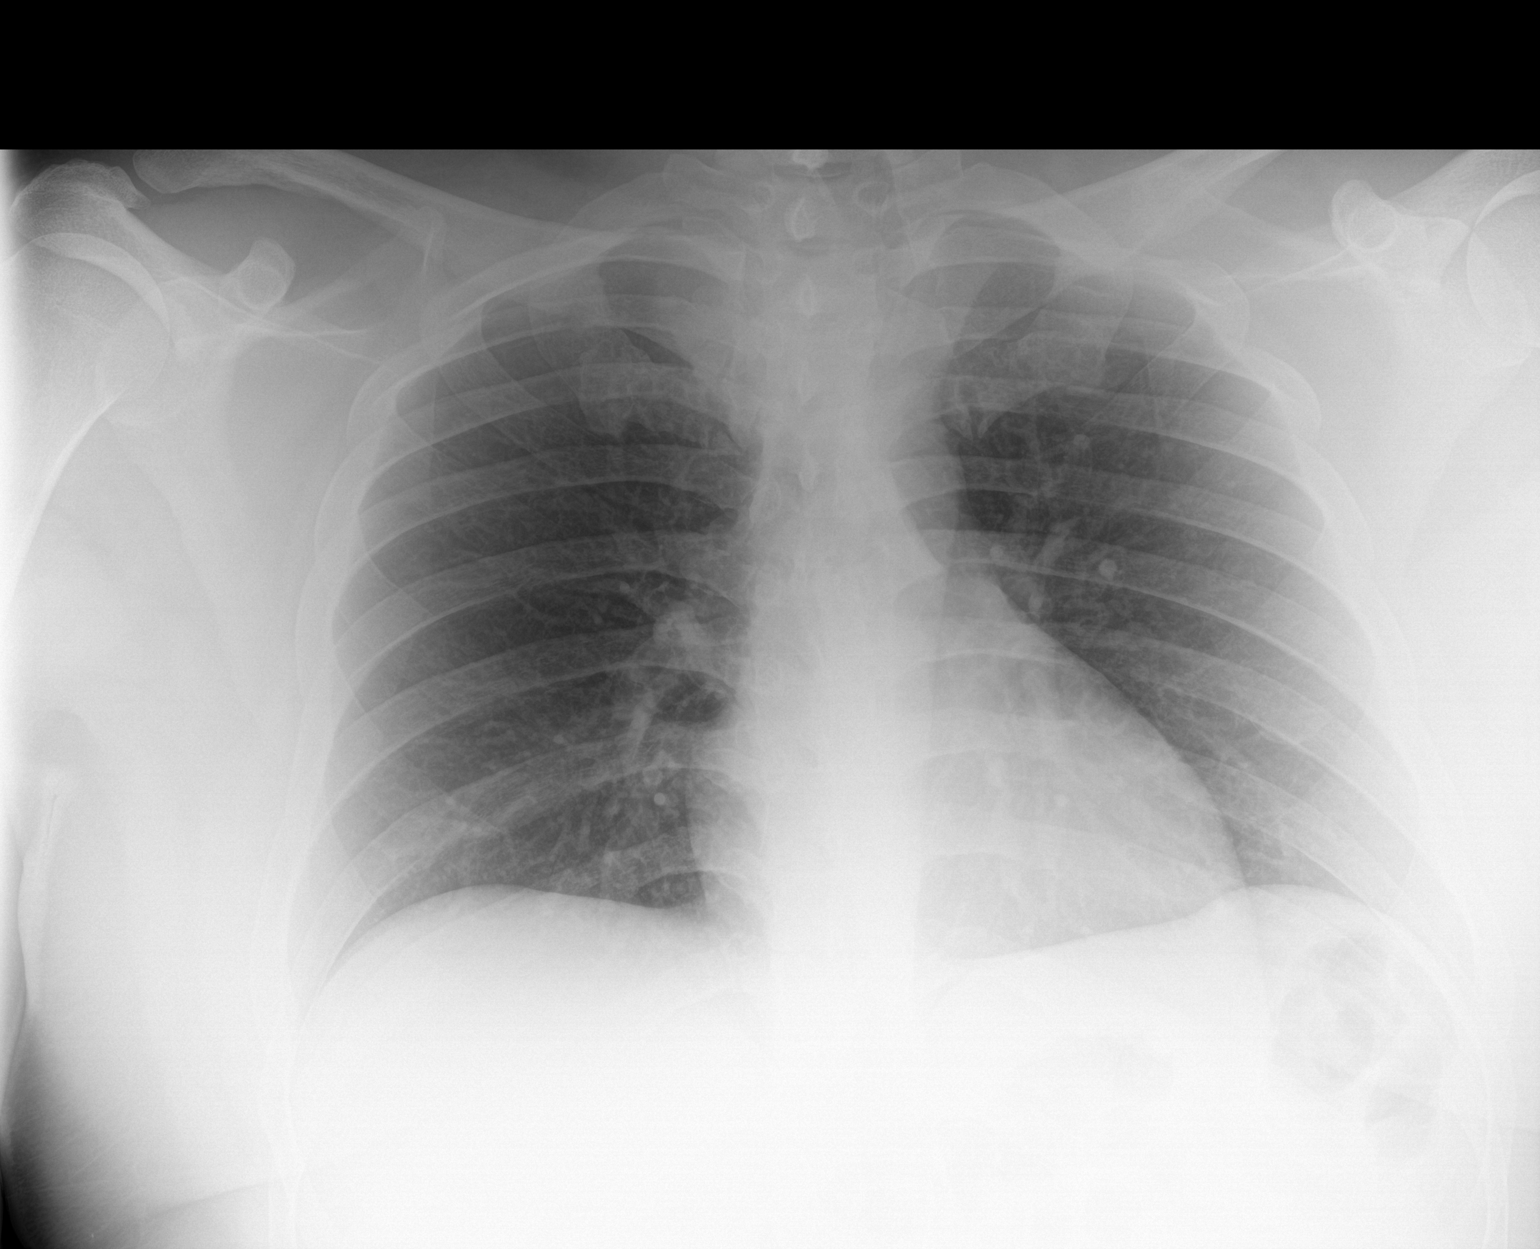

[1 of 1 positions shown; findings below may reference images not displayed]

FINDINGS: The heart size and mediastinal contours are within normal limits.
Both lungs are clear. The visualized skeletal structures are
unremarkable.
IMPRESSION: No active disease.

## 2020-12-07 ENCOUNTER — Ambulatory Visit: Payer: Medicaid Other | Admitting: Psychology

## 2020-12-13 ENCOUNTER — Telehealth: Payer: Self-pay | Admitting: Neurology

## 2020-12-13 NOTE — Telephone Encounter (Signed)
[facility] Ivin Booty) called, would like a call from Dr. Brett Fairy to discuss Medicaid approval. Will be available after 2:30pm today.  Contact info: [phone]

## 2020-12-13 NOTE — Telephone Encounter (Signed)
Called back and spoke w/ Diane who transferred me to [place]. They submitted CMM but insurance denied CPAP since patient has HST. Covid flexibility ended 11/16/2020 so Medicaid no longer will allow HST for CPAP approval. She will send [place] denial letter at [phone].

## 2020-12-13 NOTE — Telephone Encounter (Signed)
He would need a more recent sleep study, HST or PSG, within the last 6 month.

## 2020-12-13 NOTE — Telephone Encounter (Signed)
Took call and spoke w/ [name]. Had sleep study but never got machine back in 2017. Going to resend most recent sleep study we faxed over today to insurance to ask for reconsideration for approval of cpap. They will let us know if anything else is needed moving forward.

## 2020-12-13 NOTE — Telephone Encounter (Signed)
Spoke w/ Barrie Lyme in sleep lab. She mentioned Medicaid does accept HST. May be denying because he perviously was set up w/ cpap but was non compliant and machine returned. He may not have reported this. I called Ivin Booty back at [employer]. She tried calling pt while I was on the phone to discuss but could not reach him. They did not have detailed HST report w/ graph. Would like [place] to fax this. Once she reached pt, she will call us back.   I faxed HST detailed report to them at [phone]. Received fax confirmation.

## 2020-12-21 ENCOUNTER — Ambulatory Visit: Payer: Medicaid Other | Admitting: Psychology

## 2020-12-27 NOTE — Telephone Encounter (Signed)
Received fax from [facility] that pt set up for auto-CPAP 12/27/20. Settings: 6-18cm. Resmed Airfit F20 (med). Compliance appt needed within 90 days.

## 2021-01-15 ENCOUNTER — Other Ambulatory Visit: Payer: Self-pay | Admitting: Medical

## 2021-01-18 ENCOUNTER — Other Ambulatory Visit: Payer: Self-pay

## 2021-01-18 ENCOUNTER — Ambulatory Visit (INDEPENDENT_AMBULATORY_CARE_PROVIDER_SITE_OTHER): Payer: Medicaid Other | Admitting: Psychology

## 2021-01-18 DIAGNOSIS — F70 Mild intellectual disabilities: Secondary | ICD-10-CM | POA: Diagnosis not present

## 2021-01-18 DIAGNOSIS — F4323 Adjustment disorder with mixed anxiety and depressed mood: Secondary | ICD-10-CM

## 2021-01-18 DIAGNOSIS — F900 Attention-deficit hyperactivity disorder, predominantly inattentive type: Secondary | ICD-10-CM | POA: Diagnosis not present

## 2021-02-01 ENCOUNTER — Ambulatory Visit: Payer: Medicaid Other | Admitting: Psychology

## 2021-02-05 ENCOUNTER — Telehealth: Payer: Self-pay | Admitting: Medical

## 2021-02-05 NOTE — Telephone Encounter (Signed)
Pt and his dad called and stated that they need a letter stating that Torey is mentally ill. Pts dad has been claiming him for years on his taxes and he was audited this year. So he will need proof that Eytan is mentally ill

## 2021-02-06 NOTE — Telephone Encounter (Signed)
I thought so too. If you would prefer them both come in and discuss this in person I can schedule him. The dad did say he has been claiming him for years but this year he randomly got audited.

## 2021-02-14 ENCOUNTER — Other Ambulatory Visit: Payer: Self-pay

## 2021-02-14 ENCOUNTER — Encounter: Payer: Self-pay | Admitting: Medical

## 2021-02-14 ENCOUNTER — Ambulatory Visit: Payer: Medicaid Other | Admitting: Medical

## 2021-02-14 VITALS — BP 120/80 | HR 76 | Wt 257.4 lb

## 2021-02-14 DIAGNOSIS — F411 Generalized anxiety disorder: Secondary | ICD-10-CM

## 2021-02-14 DIAGNOSIS — F29 Unspecified psychosis not due to a substance or known physiological condition: Secondary | ICD-10-CM | POA: Diagnosis not present

## 2021-02-14 DIAGNOSIS — F901 Attention-deficit hyperactivity disorder, predominantly hyperactive type: Secondary | ICD-10-CM

## 2021-02-14 DIAGNOSIS — F819 Developmental disorder of scholastic skills, unspecified: Secondary | ICD-10-CM

## 2021-02-14 DIAGNOSIS — G4733 Obstructive sleep apnea (adult) (pediatric): Secondary | ICD-10-CM

## 2021-02-14 NOTE — Progress Notes (Signed)
Subjective:  [name] is a 35 y.o. male who presents for Chief Complaint  Patient presents with   discuss letter    Needs a letter stating pt has disability and parent has been auditted due to pt being over 21 and parent has been claiming him on his taxes as a dependent     Here today with father about request for letter.  [name] has always lived with his parents, has always been dependent upon them, his father provides his housing, food, and support.  [name] has never had a full time job.    Recently father has been audited by state tax dept and is needing documentation of [name]'s issues that would require him to be dependent upon his father  [name] has hx/o learning disability, attention deficit disorder, anxiety, depression, slow learning speed, limited reading and writing skills, psychotic disorder with hallucinations.  He also has sleep apnea.  He had formal psychological evaluation 09/22/2006 attesting to learning disability, anxiety, depression, slow learning speeds.   He has seen psychiatry in 2021 regarding anxiety, depression and psychotic disorder.  We have helped managed his medicaiton and chronic conditions here since 2008.     [name] currently is unemployed.   He has no particular concerns today otherwise.  Compliant with medicaiton  No other aggravating or relieving factors.    No other c/o.  Past Medical History:  Diagnosis Date   ADD (attention deficit disorder)    Anxiety    Depression    Gastritis 03/2015   EGD Dr. [name]   H/O echocardiogram 10/2007   normal stress echo, Dr. Jenkins Rouge   Learning disability    Obesity    Psychotic disorder with hallucinations ([place])    Tinea corporis    Wears glasses    Current Outpatient Medications on File Prior to Visit  Medication Sig Dispense Refill   amphetamine-dextroamphetamine (ADDERALL) 30 MG tablet Take 1 tablet by mouth 2 (two) times daily. 60 tablet 0   amphetamine-dextroamphetamine (ADDERALL) 30  MG tablet Take 1 tablet by mouth daily. 60 tablet 0   amphetamine-dextroamphetamine (ADDERALL) 30 MG tablet Take 1 tablet by mouth daily. 60 tablet 0   pantoprazole (PROTONIX) 40 MG tablet TAKE 1 TABLET(40 MG) BY MOUTH DAILY 30 tablet 4   VITAMIN D PO Take 1 tablet by mouth daily.     hydrocortisone 2.5 % lotion Apply topically daily as needed.     ketoconazole (NIZORAL) 2 % shampoo Apply topically.     No current facility-administered medications on file prior to visit.     The following portions of the patient's history were reviewed and updated as appropriate: allergies, current medications, past family history, past medical history, past social history, past surgical history and problem list.  ROS Otherwise as in subjective above  Objective: BP 120/80   Pulse 76   Wt 257 lb 6.4 oz (116.8 kg)   BMI 33.96 kg/m   General appearance: alert, no distress, well developed, well nourished Psych: pleasant, answers questions approprietly   Assessment: Encounter Diagnoses  Name Primary?   Psychotic disorder with hallucinations ([name]) Yes   Attention deficit hyperactivity disorder (ADHD), predominantly hyperactive type    Generalized anxiety disorder    Learning disability    OSA (obstructive sleep apnea)      Plan: We discussed their concerns and needs for letter.  I reviewed his chart records from 2008 including psychological evaluation done in 09/2006, psychiatry visit notes from 2021, and my chart records with  him.  Continue current medicaiton.    Birch was seen today for discuss letter.  Diagnoses and all orders for this visit:  Psychotic disorder with hallucinations ([name])  Attention deficit hyperactivity disorder (ADHD), predominantly hyperactive type  Generalized anxiety disorder  Learning disability  OSA (obstructive sleep apnea)   Follow up: q88mo

## 2021-02-15 ENCOUNTER — Ambulatory Visit: Payer: Medicaid Other | Admitting: Psychology

## 2021-03-01 ENCOUNTER — Ambulatory Visit: Payer: Medicaid Other | Admitting: Psychology

## 2021-03-09 ENCOUNTER — Other Ambulatory Visit: Payer: Self-pay

## 2021-03-09 ENCOUNTER — Ambulatory Visit (INDEPENDENT_AMBULATORY_CARE_PROVIDER_SITE_OTHER): Payer: Medicaid Other

## 2021-03-09 DIAGNOSIS — Z23 Encounter for immunization: Secondary | ICD-10-CM

## 2021-03-15 ENCOUNTER — Ambulatory Visit: Payer: Medicaid Other | Admitting: Psychology

## 2021-03-20 ENCOUNTER — Encounter: Payer: Self-pay | Admitting: Family Medicine

## 2021-03-20 ENCOUNTER — Ambulatory Visit: Payer: Medicaid Other | Admitting: Family Medicine

## 2021-03-20 VITALS — BP 128/91 | HR 74 | Ht 74.0 in | Wt 256.0 lb

## 2021-03-20 DIAGNOSIS — Z9989 Dependence on other enabling machines and devices: Secondary | ICD-10-CM

## 2021-03-20 DIAGNOSIS — G4733 Obstructive sleep apnea (adult) (pediatric): Secondary | ICD-10-CM

## 2021-03-20 NOTE — Patient Instructions (Addendum)
Please continue using your CPAP regularly. While your insurance requires that you use CPAP at least 4 hours each night on 70% of the nights, I recommend, that you not skip any nights and use it throughout the night if you can. Getting used to CPAP and staying with the treatment long term does take time and patience and discipline. Untreated obstructive sleep apnea when it is moderate to severe can have an adverse impact on cardiovascular health and raise her risk for heart disease, arrhythmias, hypertension, congestive heart failure, stroke and diabetes. Untreated obstructive sleep apnea causes sleep disruption, nonrestorative sleep, and sleep deprivation. This can have an impact on your day to day functioning and cause daytime sleepiness and impairment of cognitive function, memory loss, mood disturbance, and problems focussing. Using CPAP regularly can improve these symptoms.  I will follow up with you in 4 months

## 2021-03-20 NOTE — Progress Notes (Signed)
PATIENT: [name] DOB: Oct 20, 1985  REASON FOR VISIT: follow up HISTORY FROM: patient  Chief Complaint  Patient presents with   Obstructive Sleep Apnea    Rm 11, alone. Here for initial CPAP f/u. Pt reports doing well. Pt is waking up less tired. Mask fits good, no issues or concerns.      HISTORY OF PRESENT ILLNESS:  03/20/21 ALL:  [name] is a 35 y.o. male here today for follow up for OSA on CPAP. He was seen by Dr Dohmeier in 05/2020 for difficulty initiating and maintaining sleep. HST 06/2020 showed severe OSA with total AHI of 31.9/hr. AutoPAP ordered and set up 12/2020. Since, he is doing well. He is adjusting to CPAP therapy. He admits that he doesn't always use therapy but does report feeling better when he does.     HISTORY: (copied from Dr Dohmeier's previous note)  [name] is a 35 y.o. year old  African- American male patient of Dr Gladstone Lighter and referred for a sleep consultation by PA Tysinger. seen  on 06/27/2020. Chief concern according to patient : " I am not able to sleep, I am always looking around, and easily woken up- I have a hard time with concentration , easily distracted. I am falling asleep when the sun rises and sleep into the afternoon".   [name] is a left-handed [place] American male with chronic insomnia, delayed sleep phase syndrome, sleeping in daytime . He  has a past medical history of ADHD (attention deficit disorder with hyperactivity), Anxiety, Depression, Gastritis (03/2015), Learning disability, Obesity,  Insomnia,  Psychotic disorder with hallucinations ([place]), Tinea corporis, and Wears glasses.   The patient had the first sleep study in the year 2017  with no sleep time recorded. Advise was given to addre delayed sleep phase syndrome and address with psychology.    Sleep relevant medical history:  No ENT surgery, no Tonsillectomy.   Family medical /sleep history: no other family member on CPAP with OSA,  insomnia, not aware of any psychiatric illness.  Social history:  Patient is working as a Engineer, maintenance (IT), Scientist, research (life sciences)- looking for work. Unemployed since 04-2018. He lives in a household alone.  Tobacco use- never .  ETOH use ; none , Caffeine intake in form of Soda( 1 a day)  Regular exercise ; weight lifting. Hobbies :cooking.    Sleep habits are as follows: The patient's dinner time is between 11 PM. The patient takes a hot shower, goes to bed at 12 PM and is awake - for hours, once asleep he  continues to sleep for 5-6  hours, wakes rarely for  bathroom breaks.  He has racing thoughts, incoherent thoughts. Watches T in bed, or reads.  The preferred sleep position is supine , with the support of 1 pillow.  Dreams are reportedly rare.  3-4  PM is the usual rise time. The patient wakes up spontaneously.  He reports not feeling refreshed or restored from sleep, with symptoms such as dry mouth, morning headaches, and residual fatigue.  Naps are taken infrequent.    REVIEW OF SYSTEMS: Out of a complete 14 system review of symptoms, the patient complains only of the following symptoms, none and all other reviewed systems are negative.  ESS: previously 5/24, 7/24  ALLERGIES: Allergies  Allergen Reactions   Grass Extracts [Gramineae Pollens] Swelling    HOME MEDICATIONS: Outpatient Medications Prior to Visit  Medication Sig Dispense Refill   amphetamine-dextroamphetamine (ADDERALL) 30  MG tablet Take 1 tablet by mouth 2 (two) times daily. 60 tablet 0   amphetamine-dextroamphetamine (ADDERALL) 30 MG tablet Take 1 tablet by mouth daily. 60 tablet 0   amphetamine-dextroamphetamine (ADDERALL) 30 MG tablet Take 1 tablet by mouth daily. 60 tablet 0   hydrocortisone 2.5 % lotion Apply topically daily as needed.     ketoconazole (NIZORAL) 2 % shampoo Apply topically.     pantoprazole (PROTONIX) 40 MG tablet TAKE 1 TABLET(40 MG) BY MOUTH DAILY 30 tablet 4   VITAMIN D PO Take 1 tablet by  mouth daily.     No facility-administered medications prior to visit.    PAST MEDICAL HISTORY: Past Medical History:  Diagnosis Date   ADD (attention deficit disorder)    Anxiety    Depression    Gastritis 03/2015   EGD Dr. [name]   H/O echocardiogram 10/2007   normal stress echo, Dr. Jenkins Rouge   Learning disability    Obesity    Psychotic disorder with hallucinations ([place])    Tinea corporis    Wears glasses     PAST SURGICAL HISTORY: Past Surgical History:  Procedure Laterality Date   ESOPHAGOGASTRODUODENOSCOPY  03/2015   gastritis; Dr. [name]    FAMILY HISTORY: Family History  Problem Relation Age of Onset   Hypertension Mother    Deep vein thrombosis Mother    Multiple sclerosis Father    Diabetes Paternal Aunt    Heart disease Paternal Uncle    Prostate cancer Paternal Grandfather    Cancer Paternal Grandmother        uterine   Stroke Neg Hx     SOCIAL HISTORY: Social History   Socioeconomic History   Marital status: Single    Spouse name: Not on file   Number of children: 0   Years of education: Not on file   Highest education level: Associate degree: academic program  Occupational History    Comment: Health and safety inspector arts  Tobacco Use   Smoking status: Never   Smokeless tobacco: Never  Vaping Use   Vaping Use: Never used  Substance and Sexual Activity   Alcohol use: No   Drug use: No   Sexual activity: Yes    Partners: Female  Other Topics Concern   Not on file  Social History Narrative   Lives with father.   Has degree from [employer] for [employer], does modeling and acting on the side.  Still has to take certifying exam for culinary arts.  Exercise- some weights, walking.  Diet - ok, avoids soda, tries to eat healthy.  04/2020   Social Determinants of Health   Financial Resource Strain: Not on file  Food Insecurity: Not on file  Transportation Needs: Not on file  Physical Activity: Not on file  Stress: Not on file  Social  Connections: Not on file  Intimate Partner Violence: Not on file     PHYSICAL EXAM  Vitals:   03/20/21 1320  BP: (!) 128/91  Pulse: 74  Weight: 256 lb (116.1 kg)  Height: 6\' 2"  (1.88 m)   Body mass index is 32.87 kg/m.  Generalized: Well developed, in no acute distress  Cardiology: normal rate and rhythm, no murmur noted Respiratory: clear to auscultation bilaterally  Neurological examination  Mentation: Alert oriented to time, place, history taking. Follows all commands speech and language fluent Cranial nerve II-XII: Pupils were equal round reactive to light. Extraocular movements were full, visual field were full  Motor: The motor testing reveals 5  over 5 strength of all 4 extremities. Good symmetric motor tone is noted throughout.  Gait and station: Gait is normal.    DIAGNOSTIC DATA (LABS, IMAGING, TESTING) - I reviewed patient records, labs, notes, testing and imaging myself where available.  No flowsheet data found.   Lab Results  Component Value Date   WBC 6.0 05/08/2020   HGB 16.4 05/08/2020   HCT 47.9 05/08/2020   MCV 82 05/08/2020   PLT 327 05/08/2020      Component Value Date/Time   NA 142 05/08/2020 0900   K 4.7 05/08/2020 0900   CL 104 05/08/2020 0900   CO2 23 05/08/2020 0900   GLUCOSE 99 05/08/2020 0900   GLUCOSE 96 07/14/2019 0034   BUN 14 05/08/2020 0900   CREATININE 1.22 05/08/2020 0900   CREATININE 1.03 07/21/2015 0001   CALCIUM 10.1 05/08/2020 0900   PROT 8.0 07/14/2019 0034   PROT 7.3 05/07/2019 0917   ALBUMIN 4.5 07/14/2019 0034   ALBUMIN 4.7 05/07/2019 0917   AST 24 07/14/2019 0034   ALT 42 07/14/2019 0034   ALKPHOS 45 07/14/2019 0034   BILITOT 0.7 07/14/2019 0034   BILITOT 0.4 05/07/2019 0917   GFRNONAA 77 05/08/2020 0900   GFRAA 89 05/08/2020 0900   Lab Results  Component Value Date   CHOL 203 (H) 05/07/2019   HDL 61 05/07/2019   LDLCALC 123 (H) 05/07/2019   TRIG 104 05/07/2019   CHOLHDL 3.3 05/07/2019   Lab Results   Component Value Date   HGBA1C 5.5 05/08/2020   No results found for: [id number] Lab Results  Component Value Date   TSH 1.690 05/08/2020     ASSESSMENT AND PLAN 34 y.o. year old male  has a past medical history of ADD (attention deficit disorder), Anxiety, Depression, Gastritis (03/2015), H/O echocardiogram (10/2007), Learning disability, Obesity, Psychotic disorder with hallucinations ([place]), Tinea corporis, and Wears glasses. here with     ICD-10-CM   1. OSA on CPAP  G47.33    Z99.89         LINK BURGESON is doing well on CPAP therapy. He continues to adjust to therapy but is excited about continuing. Compliance report reveals suboptimal compliance. He was encouraged to continue using CPAP nightly and for greater than 4 hours each night. We will update supply orders as indicated. Risks of untreated sleep apnea review and education materials provided. Healthy lifestyle habits encouraged. He will follow up in 4 months, sooner if needed. He verbalizes understanding and agreement with this plan.    No orders of the defined types were placed in this encounter.    No orders of the defined types were placed in this encounter.     Debbora Presto, FNP-C 03/20/2021, 1:36 PM [facility] [address], [place], [place] [address] [phone]

## 2021-03-29 ENCOUNTER — Telehealth: Payer: Self-pay

## 2021-03-29 ENCOUNTER — Ambulatory Visit: Payer: Medicaid Other | Admitting: Psychology

## 2021-03-29 DIAGNOSIS — L089 Local infection of the skin and subcutaneous tissue, unspecified: Secondary | ICD-10-CM

## 2021-03-29 NOTE — Telephone Encounter (Signed)
Pt. Called stating he needs a new referral sent to [facility] in [place]. He has appointment with them tomorrow for a f/u. If that could be sent in today if possible since his apt. Is tomorrow.

## 2021-03-29 NOTE — Telephone Encounter (Signed)
I have put order in for referral

## 2021-05-02 ENCOUNTER — Ambulatory Visit: Payer: Medicaid Other | Admitting: Diagnostic Neuroimaging

## 2021-05-07 ENCOUNTER — Telehealth: Payer: Self-pay | Admitting: Medical

## 2021-05-07 ENCOUNTER — Other Ambulatory Visit: Payer: Self-pay | Admitting: Medical

## 2021-05-07 MED ORDER — AMPHETAMINE-DEXTROAMPHETAMINE 30 MG PO TABS: 30.0000 mg | ORAL_TABLET | Freq: Every day | ORAL | 0 refills | Status: DC

## 2021-05-07 NOTE — Telephone Encounter (Signed)
Pt was notified  and I have gone ahead and put in referral to [facility]

## 2021-05-07 NOTE — Telephone Encounter (Signed)
Pt left a vm and needs a refill on Adderall sent to the [facility] on [address].

## 2021-05-09 ENCOUNTER — Encounter: Payer: Medicaid Other | Admitting: Medical

## 2021-05-09 ENCOUNTER — Telehealth: Payer: Self-pay | Admitting: Medical

## 2021-05-09 ENCOUNTER — Encounter: Payer: Self-pay | Admitting: Medical

## 2021-05-09 NOTE — Telephone Encounter (Signed)
This patient no showed for their appointment today.Which of the following is necessary for this patient.   A) No follow-up necessary   B) Follow-up urgent. Locate Patient Immediately.   C) Follow-up necessary. Contact patient and Schedule visit in ____ Days.   D) Follow-up Advised. Contact patient and Schedule visit in ____ Days.   E) Please Send no show letter to patient. Charge no show fee if no show was a CPE.    Same day cancellation due to overslept, send letter, note the no show

## 2021-05-09 NOTE — Progress Notes (Deleted)
Psych Sleep study Labs Neurosurg consult per scan 7/21 Cpap, study 2/22, see 11/22 notes Obesity  Flu  Medical team: Dr. Asencion Partridge Dohmeier, neurology/sleep medicine Dr. Andrey Spearman, neurology Neurosurgery? [facility]

## 2021-05-09 NOTE — Telephone Encounter (Signed)
Letter sent.

## 2021-06-13 ENCOUNTER — Other Ambulatory Visit: Payer: Self-pay

## 2021-06-13 ENCOUNTER — Encounter: Payer: Self-pay | Admitting: Medical

## 2021-06-13 ENCOUNTER — Ambulatory Visit: Payer: Medicaid Other | Admitting: Medical

## 2021-06-13 VITALS — BP 122/80 | HR 74 | Ht 73.0 in | Wt 265.8 lb

## 2021-06-13 DIAGNOSIS — Z6835 Body mass index (BMI) 35.0-35.9, adult: Secondary | ICD-10-CM

## 2021-06-13 DIAGNOSIS — B360 Pityriasis versicolor: Secondary | ICD-10-CM | POA: Diagnosis not present

## 2021-06-13 DIAGNOSIS — Z7185 Encounter for immunization safety counseling: Secondary | ICD-10-CM

## 2021-06-13 DIAGNOSIS — Z13220 Encounter for screening for lipoid disorders: Secondary | ICD-10-CM

## 2021-06-13 DIAGNOSIS — G4733 Obstructive sleep apnea (adult) (pediatric): Secondary | ICD-10-CM

## 2021-06-13 DIAGNOSIS — F901 Attention-deficit hyperactivity disorder, predominantly hyperactive type: Secondary | ICD-10-CM

## 2021-06-13 DIAGNOSIS — F819 Developmental disorder of scholastic skills, unspecified: Secondary | ICD-10-CM

## 2021-06-13 DIAGNOSIS — Z0000 Encounter for general adult medical examination without abnormal findings: Secondary | ICD-10-CM | POA: Diagnosis not present

## 2021-06-13 DIAGNOSIS — F411 Generalized anxiety disorder: Secondary | ICD-10-CM

## 2021-06-13 DIAGNOSIS — Z131 Encounter for screening for diabetes mellitus: Secondary | ICD-10-CM

## 2021-06-13 NOTE — Progress Notes (Signed)
Subjective:   HPI  [name] is a 36 y.o. male who presents for Chief Complaint  Patient presents with   fasting cpe    Fasting cpe, declines flu shot    Patient Care Team: Taffany Heiser, Camelia Eng, PA-C as PCP - General (Family Medicine) Emmalyne Giacomo, Camelia Eng, PA-C as Referring Physician (Family Medicine) Sees dentist Sees eye doctor Dermatology in [place], [place] Dr. Asencion Partridge Dohmeier and Debbora Presto, NP, neurology Prior psychiatry with Dr. Magda Kiel  Concerns: No recent concerns.  Saw dermatology recently.  Using nizoral topically every other day  Reviewed their medical, surgical, family, social, medication, and allergy history and updated chart as appropriate.  Past Medical History:  Diagnosis Date   ADD (attention deficit disorder)    Anxiety    Depression    Gastritis 03/2015   EGD Dr. [name]   H/O echocardiogram 10/2007   normal stress echo, Dr. 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 [name]    Family History  Problem Relation Age of Onset   Hypertension Mother    Deep vein thrombosis Mother    Cancer Father 99       father   Multiple sclerosis Father    Diabetes Paternal Aunt    Heart disease Paternal Uncle    Cancer Paternal Grandmother        uterine   Prostate cancer Paternal Grandfather    Stroke Neg Hx      Current Outpatient Medications:    amphetamine-dextroamphetamine (ADDERALL) 30 MG tablet, Take 1 tablet by mouth 2 (two) times daily., Disp: 60 tablet, Rfl: 0   amphetamine-dextroamphetamine (ADDERALL) 30 MG tablet, Take 1 tablet by mouth daily., Disp: 60 tablet, Rfl: 0   amphetamine-dextroamphetamine (ADDERALL) 30 MG tablet, Take 1 tablet by mouth daily., Disp: 60 tablet, Rfl: 0   hydrocortisone 2.5 % lotion, Apply topically  daily as needed., Disp: , Rfl:    ketoconazole (NIZORAL) 2 % shampoo, Apply topically., Disp: , Rfl:    pantoprazole (PROTONIX) 40 MG tablet, TAKE 1 TABLET(40 MG) BY MOUTH DAILY, Disp: 30 tablet, Rfl: 4   VITAMIN D PO, Take 1 tablet by mouth daily., Disp: , Rfl:   Allergies  Allergen Reactions   Grass Extracts [Gramineae Pollens] Swelling     Review of Systems Constitutional: -fever, -chills, -sweats, -unexpected weight change, -decreased appetite, -fatigue Allergy: -sneezing, -itching, -congestion Dermatology: -changing moles, --rash, -lumps ENT: -runny nose, -ear pain, -sore throat, -hoarseness, -sinus pain, -teeth pain, - ringing in ears, -hearing loss, -nosebleeds Cardiology: -chest pain, -palpitations, -swelling, -difficulty breathing when lying flat, -waking up short of breath Respiratory: -cough, -shortness of breath, -difficulty breathing with exercise or exertion, -wheezing, -coughing up blood Gastroenterology: -abdominal pain, -nausea, -vomiting, -diarrhea, -constipation, -blood in stool, -changes in bowel movement, -difficulty swallowing or eating Hematology: -bleeding, -bruising  Musculoskeletal: -joint aches, -muscle aches, -joint swelling, -back pain, -neck pain, -cramping, -changes in gait Ophthalmology: denies vision changes, eye redness, itching, discharge Urology: -burning with urination, -difficulty urinating, -blood in urine, -urinary frequency, -urgency, -incontinence Neurology: -headache, -weakness, -tingling, -numbness, -memory loss, -falls, -dizziness Psychology: -depressed mood, -agitation, -sleep problems Male GU: no testicular mass, pain, no lymph nodes swollen, no swelling, no rash.  Depression screen [facility] 2/9 06/13/2021 02/14/2021 11/01/2020 05/08/2020 05/07/2019  Decreased Interest 0 0 2 0 0  Down, Depressed, Hopeless 0 0 1 0 0  PHQ - 2 Score 0 0 3 0 0  Altered sleeping 0 0 2 - -  Tired, decreased energy 1 1 3  - -  Change in appetite 0 0 0 - -  Feeling bad  or failure about yourself  0 0 1 - -  Trouble concentrating 2 0 3 - -  Moving slowly or fidgety/restless 0 0 2 - -  Suicidal thoughts 0 0 1 - -  PHQ-9 Score 3 1 15  - -  Difficult doing work/chores Not difficult at all Not difficult at all Not difficult at all - -  Some recent data might be hidden        Objective:  BP 122/80    Pulse 74    Ht 6\' 1"  (1.854 m)    Wt 265 lb 12.8 oz (120.6 kg)    BMI 35.07 kg/m   General appearance: alert, no distress, WD/WN, African American male Skin: some hypopigmentation of face, cheeks, but stable from prior, no other worrisome lesions HEENT: normocephalic, conjunctiva/corneas normal, sclerae anicteric, PERRLA, EOMi Neck: supple, no lymphadenopathy, no thyromegaly, no masses, normal ROM, no bruits Chest: non tender, normal shape and expansion Heart: RRR, normal S1, S2, no murmurs Lungs: CTA bilaterally, no wheezes, rhonchi, or rales Abdomen: +bs, soft, non tender, non distended, no masses, no hepatomegaly, no splenomegaly, no bruits Back: non tender, normal ROM, no scoliosis Musculoskeletal: upper extremities non tender, no obvious deformity, normal ROM throughout, lower extremities non tender, no obvious deformity, normal ROM throughout Extremities: no edema, no cyanosis, no clubbing Pulses: 2+ symmetric, upper and lower extremities, normal cap refill Neurological: alert, oriented x 3, CN2-12 intact, strength normal upper extremities and lower extremities, sensation normal throughout, DTRs 2+ throughout, no cerebellar signs, gait normal Psychiatric: normal affect, behavior normal, pleasant  GU/rectal - deferred/declined    Assessment and Plan :   Encounter Diagnoses  Name Primary?   Routine general medical examination at a health care facility Yes   Vaccine counseling    Tinea versicolor    Attention deficit hyperactivity disorder (ADHD), predominantly hyperactive type    BMI 35.0-35.9,adult    Generalized anxiety disorder    Learning  disability    OSA (obstructive sleep apnea)    Screening for diabetes mellitus    Screening for lipid disorders     This visit was a preventative care visit, also known as wellness visit or routine physical.   Topics typically include healthy lifestyle, diet, exercise, preventative care, vaccinations, sick and well care, proper use of emergency dept and after hours care, as well as other concerns.     Recommendations: Continue to return yearly for your annual wellness and preventative care visits.  This gives [place] a chance to discuss healthy lifestyle, exercise, vaccinations, review your chart record, and perform screenings where appropriate.  I recommend you see your eye doctor yearly for routine vision care.  I recommend you see your dentist yearly for routine dental care including hygiene visits twice yearly.   Vaccination recommendations were reviewed Immunization History  Administered Date(s) Administered   Influenza Whole 04/13/2007   Influenza,inj,Quad PF,6+ Mos 03/22/2013, 07/21/2015, 02/22/2016, 02/23/2019, 03/21/2020   PFIZER Comirnaty(Gray Top)Covid-19 Tri-Sucrose Vaccine 10/10/2020, 11/01/2020   Pfizer Covid-19 Vaccine Bivalent Booster 38yrs & up 03/09/2021   Tdap 08/11/2014      Screening for cancer: Colon cancer screening: Age 61  Testicular cancer screening You should do a  monthly self testicular exam if you are between 15-8 years old  We discussed PSA, prostate exam, and prostate cancer screening risks/benefits.   Age 78  Skin cancer screening: Check your skin regularly for new changes, growing lesions, or other lesions of concern Come in for evaluation if you have skin lesions of concern.  Lung cancer screening: If you have a greater than 20 pack year history of tobacco use, then you may qualify for lung cancer screening with a chest CT scan.   Please call your insurance company to inquire about coverage for this test.  We currently don't have screenings  for other cancers besides breast, cervical, colon, and lung cancers.  If you have a strong family history of cancer or have other cancer screening concerns, please let me know.    Bone health: Get at least 150 minutes of aerobic exercise weekly Get weight bearing exercise at least once weekly Bone density test:  A bone density test is an imaging test that uses a type of X-ray to measure the amount of calcium and other minerals in your bones. The test may be used to diagnose or screen you for a condition that causes weak or thin bones (osteoporosis), predict your risk for a broken bone (fracture), or determine how well your osteoporosis treatment is working. The bone density test is recommended for females 82 and older, or females or males <43 if certain risk factors such as thyroid disease, long term use of steroids such as for asthma or rheumatological issues, vitamin D deficiency, estrogen deficiency, family history of osteoporosis, self or family history of fragility fracture in first degree relative.    Heart health: Get at least 150 minutes of aerobic exercise weekly Limit alcohol It is important to maintain a healthy blood pressure and healthy cholesterol numbers  Heart disease screening: Screening for heart disease includes screening for blood pressure, fasting lipids, glucose/diabetes screening, BMI height to weight ratio, reviewed of smoking status, physical activity, and diet.    Goals include blood pressure 120/80 or less, maintaining a healthy lipid/cholesterol profile, preventing diabetes or keeping diabetes numbers under good control, not smoking or using tobacco products, exercising most days per week or at least 150 minutes per week of exercise, and eating healthy variety of fruits and vegetables, healthy oils, and avoiding unhealthy food choices like fried food, fast food, high sugar and high cholesterol foods.    Other tests may possibly include EKG test, CT coronary calcium  score, echocardiogram, exercise treadmill stress test.     Medical care options: I recommend you continue to seek care here first for routine care.  We try really hard to have available appointments Monday through Friday daytime hours for sick visits, acute visits, and physicals.  Urgent care should be used for after hours and weekends for significant issues that cannot wait till the next day.  The emergency department should be used for significant potentially life-threatening emergencies.  The emergency department is expensive, can often have long wait times for less significant concerns, so try to utilize primary care, urgent care, or telemedicine when possible to avoid unnecessary trips to the emergency department.  Virtual visits and telemedicine have been introduced since the pandemic started in 2020, and can be convenient ways to receive medical care.  We offer virtual appointments as well to assist you in a variety of options to seek medical care.   Separate significant issues discussed: OSA - recent diagnosis, on CPAP.  Reviewed recent neurology notes  Tinea  versicolor chronic - sees dermatology  ADHD, anxiety, hx/o psychotic disorder - recommended follow up with psychiatry.      Bunny was seen today for fasting cpe.  Diagnoses and all orders for this visit:  Routine general medical examination at a health care facility -     Comprehensive metabolic panel -     CBC -     Lipid panel -     Hemoglobin A1c  Vaccine counseling  Tinea versicolor  Attention deficit hyperactivity disorder (ADHD), predominantly hyperactive type  BMI 35.0-35.9,adult  Generalized anxiety disorder  Learning disability  OSA (obstructive sleep apnea)  Screening for diabetes mellitus -     Hemoglobin A1c  Screening for lipid disorders -     Lipid panel    Follow-up pending labs, yearly for physical

## 2021-06-14 LAB — COMPREHENSIVE METABOLIC PANEL
ALT: 33 IU/L (ref 0–44)
AST: 20 IU/L (ref 0–40)
Albumin/Globulin Ratio: 2 (ref 1.2–2.2)
Albumin: 4.8 g/dL (ref 4.0–5.0)
Alkaline Phosphatase: 50 IU/L (ref 44–121)
BUN/Creatinine Ratio: 10 (ref 9–20)
BUN: 13 mg/dL (ref 6–20)
Bilirubin Total: 0.4 mg/dL (ref 0.0–1.2)
CO2: 27 mmol/L (ref 20–29)
Calcium: 10.2 mg/dL (ref 8.7–10.2)
Chloride: 102 mmol/L (ref 96–106)
Creatinine, Ser: 1.29 mg/dL — ABNORMAL HIGH (ref 0.76–1.27)
Globulin, Total: 2.4 g/dL (ref 1.5–4.5)
Glucose: 93 mg/dL (ref 70–99)
Potassium: 4.9 mmol/L (ref 3.5–5.2)
Sodium: 141 mmol/L (ref 134–144)
Total Protein: 7.2 g/dL (ref 6.0–8.5)
eGFR: 75 mL/min/{1.73_m2} (ref >59)

## 2021-06-14 LAB — CBC
Hematocrit: 49 % (ref 37.5–51.0)
Hemoglobin: 16.2 g/dL (ref 13.0–17.7)
MCH: 27.6 pg (ref 26.6–33.0)
MCHC: 33.1 g/dL (ref 31.5–35.7)
MCV: 84 fL (ref 79–97)
Platelets: 324 10*3/uL (ref 150–450)
RBC: 5.87 x10E6/uL — ABNORMAL HIGH (ref 4.14–5.80)
RDW: 13.2 % (ref 11.6–15.4)
WBC: 5.1 10*3/uL (ref 3.4–10.8)

## 2021-06-14 LAB — HEMOGLOBIN A1C
Est. average glucose Bld gHb Est-mCnc: 108 mg/dL
Hgb A1c MFr Bld: 5.4 % (ref 4.8–5.6)

## 2021-06-14 LAB — LIPID PANEL
Chol/HDL Ratio: 4.8 ratio (ref 0.0–5.0)
Cholesterol, Total: 242 mg/dL — ABNORMAL HIGH (ref 100–199)
HDL: 50 mg/dL (ref >39)
LDL Chol Calc (NIH): 170 mg/dL — ABNORMAL HIGH (ref 0–99)
Triglycerides: 120 mg/dL (ref 0–149)
VLDL Cholesterol Cal: 22 mg/dL (ref 5–40)

## 2021-07-18 ENCOUNTER — Ambulatory Visit: Payer: Medicaid Other | Admitting: Medical

## 2021-07-18 ENCOUNTER — Other Ambulatory Visit: Payer: Self-pay

## 2021-07-18 VITALS — BP 110/80 | HR 81 | Wt 269.0 lb

## 2021-07-18 DIAGNOSIS — R040 Epistaxis: Secondary | ICD-10-CM

## 2021-07-18 DIAGNOSIS — G4733 Obstructive sleep apnea (adult) (pediatric): Secondary | ICD-10-CM

## 2021-07-18 DIAGNOSIS — Z9989 Dependence on other enabling machines and devices: Secondary | ICD-10-CM

## 2021-07-18 MED ORDER — MUPIROCIN CALCIUM 2 % NA OINT: 1.0000 "application " | TOPICAL_OINTMENT | Freq: Two times a day (BID) | NASAL | 0 refills | Status: DC

## 2021-07-18 NOTE — Progress Notes (Signed)
Subjective: ? [name] is a 36 y.o. male who presents for ?Chief Complaint  ?Patient presents with  ? Epistaxis  ?  Nose bleed and mouth. Took 5-7 mins to stop the bleeding  ?   ?Here for nosebleed. Started earlier today out of the blue, just in left nostril.   Took about 8 minutes to stop the nosebleed.   Wonders if related to CPAP as he started this recently and also recently had pressure adjusted.   This was a single episode.   No other recent nosebleeds.  No other aggravating or relieving factors.   ? ?No other c/o. ? ?The following portions of the patient's history were reviewed and updated as appropriate: allergies, current medications, past family history, past medical history, past social history, past surgical history and problem list. ? ?ROS ?Otherwise as in subjective above ? ?Objective: ?BP 110/80   Pulse 81   Wt 269 lb (122 kg)   BMI 35.49 kg/m?  ? ?General appearance: alert, no distress, well developed, well nourished ?HEENT: normocephalic, sclerae anicteric, conjunctiva pink and moist,  nares with some turbinate edema, there is some irritated area and redness along the septum bilaterally but worse on the left.  There is some dried blood just inside the naris on the left septum.  Otherwise pharynx normal ?Oral cavity: MMM, no lesions ?Neck: supple, no lymphadenopathy, no thyromegaly, no masses ? ? ?Assessment: ?Encounter Diagnoses  ?Name Primary?  ? Nosebleed Yes  ? OSA on CPAP   ? ? ? ?Plan: ?Nosebleed ?For the next several days use a Q-tip to apply mupirocin ointment into each nostril to help moisturize the dry irritated skin.  This is an effort to help prevent nosebleed ?If you happen to develop a nosebleed again in the next few days, hold pressure for 5 or more minutes ?Also consider getting nasal plug/nasal tampon over-the-counter to have in your first-aid kit for future nosebleeds.  You can use these along with pressure to stop a nosebleed.  However leave these in for least a few  hours before pulling them out gently so you do not disrupt the blood clot ?If you continue to have nosebleeds with the heavier pressure of the CPAP you may need a less pressure. ?Make sure you drink plenty of water throughout the day ?Recheck if not improving or if you are getting more frequent nosebleeds over the next week ? ?[name] was seen today for epistaxis. ? ?Diagnoses and all orders for this visit: ? ?Nosebleed ? ?OSA on CPAP ? ?Other orders ?-     mupirocin nasal ointment (BACTROBAN) 2 %; Place 1 application into the nose 2 (two) times daily. Use one-half of tube in each nostril twice daily for five (5) days. After application, press sides of nose together and gently massage. ? ? ? ?Follow up: prn ?

## 2021-07-18 NOTE — Patient Instructions (Addendum)
Nosebleed ?For the next several days use a Q-tip to apply mupirocin ointment into each nostril to help moisturize the dry irritated skin.  This is an effort to help prevent nosebleed ?If you happen to develop a nosebleed again in the next few days, hold pressure for 5 or more minutes ?Also consider getting nasal plug/nasal tampon over-the-counter to have in your first-aid kit for future nosebleeds.  You can use these along with pressure to stop a nosebleed.  However leave these in for least a few hours before pulling them out gently so you do not disrupt the blood clot ?If you continue to have nosebleeds with the heavier pressure of the CPAP you may need a less pressure. ?Make sure you drink plenty of water throughout the day ?Recheck if not improving or if you are getting more frequent nosebleeds over the next week ? ? ? ?Nosebleed, Adult ?A nosebleed is when blood comes out of the nose. Nosebleeds are common and can be caused by many things. They are usually not a sign of a serious medical problem. ?Follow these instructions at home: ?When you have a nosebleed: ? ?Sit down. ?Tilt your head a little forward. ?Follow these steps: ?Pinch your nose with a clean towel or tissue. ?Keep pinching your nose for 5 minutes. Do not let go. ?After 5 minutes, let go of your nose. ?If there is still bleeding, do these steps again. Keep doing these steps until the bleeding stops. ?Do not put tissues or other things in your nose to stop the bleeding. ?Avoid lying down or putting your head back. ?Use a nose spray decongestant as told by your doctor. ?After a nosebleed: ?Try not to blow your nose or sniffle for several hours. ?Try not to strain, lift, or bend at the waist for several days. ?Aspirin and blood-thinning medicines make bleeding more likely. If you take these medicines: ?Ask your doctor if you should stop taking them or if you should change how much you take. ?Do not stop taking the medicine unless your doctor tells you  to. ?If your nosebleed was caused by dryness, use over-the-counter saline nasal spray or gel and humidifier as told by your doctor. This will keep the inside of your nose moist and allow it to heal. If you need to use one of these products: ?Choose one that is water-soluble. ?Use only as much as you need and use it only as often as needed. ?Do not lie down right away after you use it. ?If you get nosebleeds often, talk with your doctor about treatments. These may include: ?Nasal cautery. A chemical swab or electrical device is used to lightly burn tiny blood vessels inside the nose. This helps stop or prevent nosebleeds. ?Nasal packing. A gauze or other material is placed in the nose to keep constant pressure on the bleeding area. ?Contact a doctor if: ?You have a fever. ?You get nosebleeds often. ?You are getting nosebleeds more often than usual. ?You bruise very easily. ?You have something stuck in your nose. ?You have bleeding in your mouth. ?You vomit or cough up brown material. ?You get a nosebleed after you start a new medicine. ?Get help right away if: ?You have a nosebleed after you fall or hurt your head. ?Your nosebleed does not go away after 20 minutes. ?You feel dizzy or weak. ?You have unusual bleeding from other parts of your body. ?You have unusual bruising on other parts of your body. ?You get sweaty. ?You vomit blood. ?Summary ?Nosebleeds are  common. They are usually not a sign of a serious medical problem. ?When you have a nosebleed, sit down and tilt your head a little forward. Pinch your nose with a clean tissue for 5 minutes. ?Use saline spray or saline gel and a humidifier as told by your doctor. ?Get help right away if your nosebleed does not go away after 20 minutes. ?This information is not intended to replace advice given to you by your health care provider. Make sure you discuss any questions you have with your health care provider. ?Document Revised: 03/04/2019 Document Reviewed:  03/04/2019 ?Elsevier Patient Education ? [place]. ? ?

## 2021-08-06 ENCOUNTER — Encounter: Payer: Self-pay | Admitting: Family Medicine

## 2021-08-06 ENCOUNTER — Ambulatory Visit: Payer: Medicaid Other | Admitting: Family Medicine

## 2021-08-06 VITALS — BP 136/89 | HR 66 | Ht 73.0 in | Wt 267.5 lb

## 2021-08-06 DIAGNOSIS — G4733 Obstructive sleep apnea (adult) (pediatric): Secondary | ICD-10-CM | POA: Diagnosis not present

## 2021-08-06 DIAGNOSIS — Z9989 Dependence on other enabling machines and devices: Secondary | ICD-10-CM | POA: Diagnosis not present

## 2021-08-06 NOTE — Progress Notes (Signed)
? ? ?PATIENT: [name] ?DOB: 1985/06/04 ? ?REASON FOR VISIT: follow up ?HISTORY FROM: patient ? ?Chief Complaint  ?Patient presents with  ? Follow-up  ?  Rm 1, alone. Here for 4 month f/u for CPAP. Pt has not used CPAP since dealing w nosebleed since March 1st. Feels like something is stuck in throat, possibly drainage.   ?  ? ?HISTORY OF PRESENT ILLNESS: ? ?08/06/21 ALL:  ?[name] returns for follow up for OSA on CPAP. He continues to work on meeting compliance with therapy. He has not used CPAP since 07/18/2021 due to having a nose bleed. He was afraid CPAP was contributing and has been hesitant to restart. No further episodes of nasal bleeding. He was seen by PCP. He does report concerns of seasonal allergies.  ? ? ? ? ? ?03/20/2021 ALL:  ?[name] is a 36 y.o. male here today for follow up for OSA on CPAP. He was seen by Dr Dohmeier in 05/2020 for difficulty initiating and maintaining sleep. HST 06/2020 showed severe OSA with total AHI of 31.9/hr. AutoPAP ordered and set up 12/2020. Since, he is doing well. He is adjusting to CPAP therapy. He admits that he doesn't always use therapy but does report feeling better when he does.  ? ? ? ?HISTORY: (copied from Dr Dohmeier's previous note) ? ?[name] is a 36 y.o. year old  African- American male patient of Dr Gladstone Lighter and referred for a sleep consultation by PA Tysinger. seen  on 06/27/2020. ?Chief concern according to patient : " I am not able to sleep, I am always looking around, and easily woken up- I have a hard time with concentration , easily distracted. I am falling asleep when the sun rises and sleep into the afternoon". ?  ?[name] is a left-handed African American male with chronic insomnia, delayed sleep phase syndrome, sleeping in daytime . 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 ?  ?The patient had the first sleep study in the year 2017  with no sleep time recorded. Advise was given to addre delayed sleep phase syndrome and address with psychology.  ?  ?Sleep relevant medical history:  No ENT surgery, no Tonsillectomy. ?  ?Family medical /sleep history: no other family member on CPAP with OSA, insomnia, not aware of any psychiatric illness.  ?Social history:  Patient is working as a Engineer, maintenance (IT), Scientist, research (life sciences)- looking for work. Unemployed since 04-2018. He lives in a household alone.  ?Tobacco use- never .  ETOH use ; none , Caffeine intake in form of Soda( 1 a day)  ?Regular exercise ; weight lifting. Hobbies :cooking.  ?  ?Sleep habits are as follows: The patient's dinner time is between 11 PM. The patient takes a hot shower, goes to bed at 12 PM and is awake - for hours, once asleep he  continues to sleep for 5-6  hours, wakes rarely for  bathroom breaks.  He has racing thoughts, incoherent thoughts. Watches T in bed, or reads.  ?The preferred sleep position is supine , with the support of 1 pillow.  ?Dreams are reportedly rare.  ?3-4  PM is the usual rise time. The patient wakes up spontaneously.  ?He reports not feeling refreshed or restored from sleep, with symptoms such as dry mouth, morning headaches, and residual fatigue.  ?  Naps are taken infrequent.  ? ? ?REVIEW OF SYSTEMS: Out of a complete 14 system review of symptoms, the patient complains only of the following symptoms, nose bleed, seasonal allergies  and all other reviewed systems are negative. ? ?ESS: 7/24, previously 5/24, 7/24 ? ?ALLERGIES: ?Allergies  ?Allergen Reactions  ? Grass Extracts [Gramineae Pollens] Swelling  ? ? ?HOME MEDICATIONS: ?Outpatient Medications Prior to Visit  ?Medication Sig Dispense Refill  ? amphetamine-dextroamphetamine (ADDERALL) 30 MG tablet Take 1 tablet by mouth 2 (two) times daily. 60 tablet 0  ?  amphetamine-dextroamphetamine (ADDERALL) 30 MG tablet Take 1 tablet by mouth daily. 60 tablet 0  ? amphetamine-dextroamphetamine (ADDERALL) 30 MG tablet Take 1 tablet by mouth daily. 60 tablet 0  ? hydrocortisone 2.5 % lotion Apply topically daily as needed.    ? ketoconazole (NIZORAL) 2 % shampoo Apply topically.    ? mupirocin nasal ointment (BACTROBAN) 2 % Place 1 application into the nose 2 (two) times daily. Use one-half of tube in each nostril twice daily for five (5) days. After application, press sides of nose together and gently massage. 10 g 0  ? pantoprazole (PROTONIX) 40 MG tablet TAKE 1 TABLET(40 MG) BY MOUTH DAILY 30 tablet 4  ? VITAMIN D PO Take 1 tablet by mouth daily.    ? ?No facility-administered medications prior to visit.  ? ? ?PAST MEDICAL HISTORY: ?Past Medical History:  ?Diagnosis Date  ? ADD (attention deficit disorder)   ? Anxiety   ? Depression   ? Gastritis 03/2015  ? EGD Dr. [name]  ? H/O echocardiogram 10/2007  ? normal stress echo, Dr. Jenkins Rouge  ? Learning disability   ? Obesity   ? 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 Tinea corporis   ? Wears glasses   ? ? ?PAST SURGICAL HISTORY: ?Past Surgical History:  ?Procedure Laterality Date  ? ESOPHAGOGASTRODUODENOSCOPY  03/2015  ? gastritis; Dr. [name]  ? ? ?FAMILY HISTORY: ?Family History  ?Problem Relation Age of Onset  ? Hypertension Mother   ? Deep vein thrombosis Mother   ? Cancer Father 65  ?     father  ? Multiple sclerosis Father   ? Diabetes Paternal Aunt   ? Heart disease Paternal Uncle   ? Cancer Paternal Grandmother   ?     uterine  ? Prostate cancer Paternal Grandfather   ? Stroke Neg Hx   ? ? ?SOCIAL HISTORY: ?Social History  ? ?Socioeconomic History  ? Marital status: Single  ?  Spouse name: Not on file  ? Number of children: 0  ? Years of education: Not on file  ? Highest education level: Associate degree: academic program  ?Occupational History  ?  Comment: culinary arts  ?Tobacco Use  ? Smoking  status: Never  ? Smokeless tobacco: Never  ?Vaping Use  ? Vaping Use: Never used  ?Substance and Sexual Activity  ? Alcohol use: No  ? Drug use: No  ? Sexual activity: Yes  ?  Partners: Female  ?Other Topics Concern  ? Not on file  ?Social History Narrative  ? Lives with father.   Has degree from [employer] for [employer], does modeling and acting on the side.   Exercise- some weights, walking.  05/2021  ? ?Social Determinants of Health  ? ?Financial Resource Strain: Not on file  ?Food Insecurity: Not on file  ?Transportation Needs: Not on file  ?Physical Activity: Not on file  ?Stress: Not on file  ?Social Connections:  Not on file  ?Intimate Partner Violence: Not on file  ? ? ? ?PHYSICAL EXAM ? ?Vitals:  ? 08/06/21 1251  ?BP: 136/89  ?Pulse: 66  ?Weight: 267 lb 8 oz (121.3 kg)  ?Height: '6\' 1"'$  (1.854 m)  ? ? ?Body mass index is 35.29 kg/m?. ? ?Generalized: Well developed, in no acute distress  ?Cardiology: normal rate and rhythm, no murmur noted ?Respiratory: clear to auscultation bilaterally  ?Neurological examination  ?Mentation: Alert oriented to time, place, history taking. Follows all commands speech and language fluent ?Cranial nerve II-XII: Pupils were equal round reactive to light. Extraocular movements were full, visual field were full  ?Motor: The motor testing reveals 5 over 5 strength of all 4 extremities. Good symmetric motor tone is noted throughout.  ?Gait and station: Gait is normal.  ? ? ?DIAGNOSTIC DATA (LABS, IMAGING, TESTING) ?- I reviewed patient records, labs, notes, testing and imaging myself where available. ? ?No flowsheet data found.  ? ?Lab Results  ?Component Value Date  ? WBC 5.1 06/13/2021  ? HGB 16.2 06/13/2021  ? HCT 49.0 06/13/2021  ? MCV 84 06/13/2021  ? PLT 324 06/13/2021  ? ?   ?Component Value Date/Time  ? NA 141 06/13/2021 1457  ? K 4.9 06/13/2021 1457  ? CL 102 06/13/2021 1457  ? CO2 27 06/13/2021 1457  ? GLUCOSE 93 06/13/2021 1457  ? GLUCOSE 96 07/14/2019 0034  ? BUN 13 06/13/2021  1457  ? CREATININE 1.29 (H) 06/13/2021 1457  ? CREATININE 1.03 07/21/2015 0001  ? CALCIUM 10.2 06/13/2021 1457  ? PROT 7.2 06/13/2021 1457  ? ALBUMIN 4.8 06/13/2021 1457  ? AST 20 06/13/2021 1457  ? ALT 33 01/25/2

## 2021-08-06 NOTE — Progress Notes (Signed)
Order faxed to DME: [facility] ?F: [phone]  ? ?Fax confirmation received  ?

## 2021-08-06 NOTE — Patient Instructions (Addendum)
Please continue using your CPAP regularly. While your insurance requires that you use CPAP at least 4 hours each night on 70% of the nights, I recommend, that you not skip any nights and use it throughout the night if you can. Getting used to CPAP and staying with the treatment long term does take time and patience and discipline. Untreated obstructive sleep apnea when it is moderate to severe can have an adverse impact on cardiovascular health and raise her risk for heart disease, arrhythmias, hypertension, congestive heart failure, stroke and diabetes. Untreated obstructive sleep apnea causes sleep disruption, nonrestorative sleep, and sleep deprivation. This can have an impact on your day to day functioning and cause daytime sleepiness and impairment of cognitive function, memory loss, mood disturbance, and problems focussing. Using CPAP regularly can improve these symptoms. ? ?Try adjusting humidity on your CPAP machine when you get home. Try Aquaphor or Saline spray for dry nose. Continue close follow up with PCP for allergy symptoms  ? ?Follow up with me in 6 months  ?

## 2021-08-22 ENCOUNTER — Other Ambulatory Visit: Payer: Self-pay | Admitting: Medical

## 2021-08-22 ENCOUNTER — Ambulatory Visit: Payer: Medicaid Other | Admitting: Medical

## 2021-08-22 VITALS — BP 120/70 | HR 78 | Temp 97.7°F | Wt 260.0 lb

## 2021-08-22 DIAGNOSIS — Z8719 Personal history of other diseases of the digestive system: Secondary | ICD-10-CM

## 2021-08-22 DIAGNOSIS — K219 Gastro-esophageal reflux disease without esophagitis: Secondary | ICD-10-CM

## 2021-08-22 DIAGNOSIS — R142 Eructation: Secondary | ICD-10-CM

## 2021-08-22 MED ORDER — SUCRALFATE 1 G PO TABS: 1.0000 g | ORAL_TABLET | Freq: Three times a day (TID) | ORAL | 0 refills | Status: DC

## 2021-08-22 MED ORDER — FAMOTIDINE 20 MG PO TABS: 20.0000 mg | ORAL_TABLET | Freq: Every day | ORAL | 1 refills | Status: DC

## 2021-08-22 MED ORDER — DEXLANSOPRAZOLE 60 MG PO CPDR: 60.0000 mg | DELAYED_RELEASE_CAPSULE | Freq: Every day | ORAL | 1 refills | Status: DC

## 2021-08-22 MED ORDER — OMEPRAZOLE 40 MG PO CPDR: 40.0000 mg | DELAYED_RELEASE_CAPSULE | Freq: Every day | ORAL | 0 refills | Status: DC

## 2021-08-22 NOTE — Patient Instructions (Signed)
Gastroesophageal Reflux Disease, Adult ? ? ?Gastroesophageal reflux disease (GERD) happens when acid from your stomach flows up into the esophagus. When acid comes in contact with the esophagus, the acid causes soreness (inflammation) in the esophagus. Over time, GERD may create small holes (ulcers) in the lining of the esophagus. ? ?CAUSES  ?Increased body weight. This puts pressure on the stomach, making acid rise from the stomach into the esophagus.  ?Smoking. This increases acid production in the stomach.  ?Drinking alcohol. This causes decreased pressure in the lower esophageal sphincter (valve or ring of muscle between the esophagus and stomach), allowing acid from the stomach into the esophagus.  ?Late evening meals and a full stomach. This increases pressure and acid production in the stomach.  ?A malformed lower esophageal sphincter.  ?Sometimes, no cause is found. ? ?SYMPTOMS  ?Burning pain in the lower part of the mid-chest behind the breastbone and in the mid-stomach area. This may occur twice a week or more often.  ?Trouble swallowing.  ?Sore throat.  ?Dry cough.  ?Asthma-like symptoms including chest tightness, shortness of breath, or wheezing.  ? ?DIAGNOSIS  ?Your caregiver may be able to diagnose GERD based on your symptoms. In some cases, X-rays and other tests may be done to check for complications or to check the condition of your stomach and esophagus. ? ?TREATMENT  ?Begin sucralfate 1 tablet about 30 minutes before meals.  This will be to coat the stomach and to reduce inflammation ?Begin Dexilant 1 capsule daily in the morning.  Stop Protonix for now. ?Begin famotidine Pepcid at night at bedtime ?If not much improved over the next 3 weeks you may need to go back to see gastroenterology ? ? ?HOME CARE INSTRUCTIONS  ?Change the factors that you can control. Ask your caregiver for guidance concerning weight loss, quitting smoking, and alcohol consumption.  ?Avoid foods and drinks that make your  symptoms worse, such as:  ?Caffeine or alcoholic drinks.  ?Chocolate.  ?Peppermint or mint flavorings.  ?Garlic and onions.  ?Spicy foods.  ?Citrus fruits, such as oranges, lemons, or limes.  ?Tomato-based foods such as sauce, chili, salsa, and pizza.  ?Fried and fatty foods.  ?Avoid lying down for the 3 hours prior to your bedtime or prior to taking a nap.  ?Eat small, frequent meals instead of large meals.  ?Wear loose-fitting clothing. Do not wear anything tight around your waist that causes pressure on your stomach.  ?Raise the head of your bed 6 to 8 inches with wood blocks to help you sleep. Extra pillows will not help.  ?Only take over-the-counter or prescription medicines for pain, discomfort, or fever as directed by your caregiver.  ?Do not take aspirin, ibuprofen, or other nonsteroidal anti-inflammatory drugs (NSAIDs).  ? ?SEEK IMMEDIATE MEDICAL CARE IF:  ?You have pain in your arms, neck, jaw, teeth, or back.  ?Your pain increases or changes in intensity or duration.  ?You develop nausea, vomiting, or sweating (diaphoresis).  ?You develop shortness of breath, or you faint.  ?Your vomit is green, yellow, black, or looks like coffee grounds or blood.  ?Your stool is red, bloody, or black.  ?These symptoms could be signs of other problems, such as heart disease, gastric bleeding, or esophageal bleeding. ?MAKE SURE YOU:  ?Understand these instructions.  ?Will watch your condition.  ?Will get help right away if you are not doing well or get worse.  ?Document Released: 02/13/2005 Document Revised: 01/16/2011 Document Reviewed: 11/23/2010 ?ExitCare? Patient Information ?[address], [place]. ?

## 2021-08-22 NOTE — Progress Notes (Signed)
Subjective: ?Chief Complaint  ?Patient presents with  ? possible reflux  ?  Feels like something is stuck in throat. Been going on a week. Can't eat much and if he does he throws it back up  ? ?Gastroesophageal Reflux ?He complains of abdominal pain (epigastric), belching, chest pain, coughing (dry), dysphagia (stuck sensation in throat), globus sensation, heartburn, nausea and a sore throat. This is a new problem. The current episode started in the past 7 days. The problem occurs frequently. The problem has been unchanged. The heartburn duration is several minutes. The heartburn is of moderate intensity. The symptoms are aggravated by certain foods. Pertinent negatives include no anemia, fatigue, melena or weight loss. There are no known risk factors. He has tried a PPI for the symptoms. The treatment provided no relief. Past procedures include an EGD (reviewed - 2016, Dr. [name], gastritis).  ? ?Past Medical History:  ?Diagnosis Date  ? ADD (attention deficit disorder)   ? Anxiety   ? Depression   ? Gastritis 03/2015  ? EGD Dr. [name]  ? H/O echocardiogram 10/2007  ? normal stress echo, Dr. Jenkins Rouge  ? Learning disability   ? Obesity   ? 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 Tinea corporis   ? Wears glasses   ? ?Current Outpatient Medications on File Prior to Visit  ?Medication Sig Dispense Refill  ? amphetamine-dextroamphetamine (ADDERALL) 30 MG tablet Take 1 tablet by mouth 2 (two) times daily. 60 tablet 0  ? amphetamine-dextroamphetamine (ADDERALL) 30 MG tablet Take 1 tablet by mouth daily. 60 tablet 0  ? amphetamine-dextroamphetamine (ADDERALL) 30 MG tablet Take 1 tablet by mouth daily. 60 tablet 0  ? hydrocortisone 2.5 % lotion Apply topically daily as needed.    ? ketoconazole (NIZORAL) 2 % shampoo Apply topically.    ? mupirocin nasal ointment (BACTROBAN) 2 % Place 1 application into the nose 2 (two) times daily. Use one-half of tube in each nostril twice daily for five (5) days. After  application, press sides of nose together and gently massage. 10 g 0  ? VITAMIN D PO Take 1 tablet by mouth daily.    ? ?No current facility-administered medications on file prior to visit.  ? ?ROS as in subjective ? ? ?Objective: ?BP 120/70   Pulse 78   Temp 97.7 ?F (36.5 ?C)   Wt 260 lb (117.9 kg)   BMI 34.30 kg/m?  ? ?General appearence: alert, no distress, WD/WN,  ?HEENT: normocephalic, sclerae anicteric, TMs pearly, nares patent, no discharge or erythema, pharynx normal ?Oral cavity: MMM, no lesions ?Neck: supple, no lymphadenopathy, no thyromegaly, no masses ?Heart: RRR, normal S1, S2, no murmurs ?Lungs: CTA bilaterally, no wheezes, rhonchi, or rales ?Abdomen: +bs, soft, +tender epigastric and LUQ region, otherwise non tender, non distended, no masses, no hepatomegaly, no splenomegaly ?Pulses: 2+ symmetric, upper and lower extremities, normal cap refill ? ? ? ?Assessment: ?Encounter Diagnoses  ?Name Primary?  ? Belching Yes  ? Gastroesophageal reflux disease, unspecified whether esophagitis present   ? History of gastritis   ? ? ? ?Plan: ?We discussed symptoms and concerns.  He has prior gastritis in 2016 on endoscopy.  He is already on Protonix but symptoms have worsened of late. ? ?TREATMENT  ?Begin sucralfate 1 tablet about 30 minutes before meals.  This will be to coat the stomach and to reduce inflammation ?Begin Dexilant 1 capsule daily in the morning.  Stop Protonix for now. ?Begin famotidine Pepcid at night at bedtime ?If not  much improved over the next 3 weeks you may need to go back to see gastroenterology ? ?Srihan was seen today for possible reflux. ? ?Diagnoses and all orders for this visit: ? ?Belching ? ?Gastroesophageal reflux disease, unspecified whether esophagitis present ? ?History of gastritis ? ?Other orders ?-     famotidine (PEPCID) 20 MG tablet; Take 1 tablet (20 mg total) by mouth at bedtime. ?-     sucralfate (CARAFATE) 1 g tablet; Take 1 tablet (1 g total) by mouth 4 (four)  times daily -  with meals and at bedtime. ?-     dexlansoprazole (DEXILANT) 60 MG capsule; Take 1 capsule (60 mg total) by mouth daily. ? ? ? ?F/u 2-3 weeks ? ? ?

## 2021-09-12 ENCOUNTER — Ambulatory Visit (INDEPENDENT_AMBULATORY_CARE_PROVIDER_SITE_OTHER): Payer: Medicaid Other | Admitting: Medical

## 2021-09-12 ENCOUNTER — Encounter: Payer: Self-pay | Admitting: Nurse Practitioner

## 2021-09-12 VITALS — BP 112/80 | HR 92 | Wt 255.4 lb

## 2021-09-12 DIAGNOSIS — K219 Gastro-esophageal reflux disease without esophagitis: Secondary | ICD-10-CM

## 2021-09-12 DIAGNOSIS — K2950 Unspecified chronic gastritis without bleeding: Secondary | ICD-10-CM | POA: Diagnosis not present

## 2021-09-12 DIAGNOSIS — R142 Eructation: Secondary | ICD-10-CM | POA: Diagnosis not present

## 2021-09-12 DIAGNOSIS — R112 Nausea with vomiting, unspecified: Secondary | ICD-10-CM | POA: Diagnosis not present

## 2021-09-12 DIAGNOSIS — R1310 Dysphagia, unspecified: Secondary | ICD-10-CM

## 2021-09-12 NOTE — Patient Instructions (Signed)
Dr. Lucio Edward, gastroenterology ?Address: [address], [place], [place] [address] ?Phone: [phone] ?

## 2021-09-12 NOTE — Progress Notes (Signed)
Subjective: ?Chief Complaint  ?Patient presents with  ? follow-up  ?  Follow-up on acid reflux. Still feels like its stuck in throat and in back of neck. Sometimes he will cough it up  ? ?Here for recheck.   I saw him April 5 for the same.  Despite adding sucralfate, going back on PPI and taking H2 blocker still not having any improvement.  He actually is getting some vomiting after eating now.  So the symptoms overall are worsening ? ?Gastroesophageal Reflux ?He complains of abdominal pain (epigastric), belching, chest pain, coughing (dry), dysphagia (stuck sensation in throat), globus sensation, heartburn, nausea and a sore throat. This is a recurrent problem. The current episode started 1 to 4 weeks ago. The problem occurs frequently. The problem has been gradually worsening. The heartburn duration is several minutes. The heartburn is of moderate intensity. The symptoms are aggravated by certain foods. Pertinent negatives include no anemia, fatigue, melena or weight loss. There are no known risk factors. He has tried a PPI and a histamine-2 antagonist for the symptoms. The treatment provided no relief. Past procedures include an EGD (reviewed - 2016, Dr. [name], gastritis).  ? ?Past Medical History:  ?Diagnosis Date  ? ADD (attention deficit disorder)   ? Anxiety   ? Depression   ? Gastritis 03/2015  ? EGD Dr. [name]  ? H/O echocardiogram 10/2007  ? normal stress echo, Dr. Jenkins Rouge  ? Learning disability   ? Obesity   ? 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 Tinea corporis   ? Wears glasses   ? ?Current Outpatient Medications on File Prior to Visit  ?Medication Sig Dispense Refill  ? amphetamine-dextroamphetamine (ADDERALL) 30 MG tablet Take 1 tablet by mouth 2 (two) times daily. 60 tablet 0  ? amphetamine-dextroamphetamine (ADDERALL) 30 MG tablet Take 1 tablet by mouth daily. 60 tablet 0  ? amphetamine-dextroamphetamine (ADDERALL) 30 MG tablet Take 1 tablet by mouth daily. 60 tablet 0  ?  famotidine (PEPCID) 20 MG tablet Take 1 tablet (20 mg total) by mouth at bedtime. 30 tablet 1  ? hydrocortisone 2.5 % lotion Apply topically daily as needed.    ? ketoconazole (NIZORAL) 2 % shampoo Apply topically.    ? mupirocin nasal ointment (BACTROBAN) 2 % Place 1 application into the nose 2 (two) times daily. Use one-half of tube in each nostril twice daily for five (5) days. After application, press sides of nose together and gently massage. 10 g 0  ? omeprazole (PRILOSEC) 40 MG capsule Take 1 capsule (40 mg total) by mouth daily. 30 capsule 0  ? sucralfate (CARAFATE) 1 g tablet Take 1 tablet (1 g total) by mouth 4 (four) times daily -  with meals and at bedtime. 45 tablet 0  ? VITAMIN D PO Take 1 tablet by mouth daily.    ? ?No current facility-administered medications on file prior to visit.  ? ?ROS as in subjective ? ? ?Objective: ?BP 112/80   Pulse 92   Wt 255 lb 6.4 oz (115.8 kg)   BMI 33.70 kg/m?  ? ?Wt Readings from Last 3 Encounters:  ?09/12/21 255 lb 6.4 oz (115.8 kg)  ?08/22/21 260 lb (117.9 kg)  ?08/06/21 267 lb 8 oz (121.3 kg)  ? ?General appearence: alert, no distress, WD/WN,  ?Neck: supple, no lymphadenopathy, no thyromegaly, no masses ?Abdomen: +bs, soft, +tender epigastric and LUQ region, otherwise non tender, non distended, no masses, no hepatomegaly, no splenomegaly ?Pulses: 2+ symmetric, upper and lower extremities,  normal cap refill ? ? ? ?Assessment: ?Encounter Diagnoses  ?Name Primary?  ? Nausea and vomiting, unspecified vomiting type Yes  ? Chronic gastritis without bleeding, unspecified gastritis type   ? Gastroesophageal reflux disease, unspecified whether esophagitis present   ? Belching   ? Dysphagia, unspecified type   ? ? ? ?Plan: ?We discussed symptoms and concerns.  He has prior gastritis in 2016 on endoscopy.   ? ?Continue sucralfate, Dexilant, famotidine, avoid reflux triggers, avoid NSAIDs, avoid eating close to bedtime.  Referral back to gastroenterology since he is  worsening and now starting to have vomiting after eating. ? ? ?[name] was seen today for follow-up. ? ?Diagnoses and all orders for this visit: ? ?Nausea and vomiting, unspecified vomiting type ?-     Ambulatory referral to Gastroenterology ? ?Chronic gastritis without bleeding, unspecified gastritis type ?-     Ambulatory referral to Gastroenterology ? ?Gastroesophageal reflux disease, unspecified whether esophagitis present ?-     Ambulatory referral to Gastroenterology ? ?Belching ?-     Ambulatory referral to Gastroenterology ? ?Dysphagia, unspecified type ?-     Ambulatory referral to Gastroenterology ? ? ? ?F/u 2-3 weeks ? ? ?

## 2021-09-25 ENCOUNTER — Other Ambulatory Visit: Payer: Self-pay | Admitting: Medical

## 2021-09-25 ENCOUNTER — Telehealth: Payer: Self-pay | Admitting: Medical

## 2021-09-25 MED ORDER — AMPHETAMINE-DEXTROAMPHETAMINE 30 MG PO TABS: 30.0000 mg | ORAL_TABLET | Freq: Two times a day (BID) | ORAL | 0 refills | Status: DC

## 2021-09-25 MED ORDER — KETOCONAZOLE 2 % EX SHAM: MEDICATED_SHAMPOO | CUTANEOUS | 2 refills | Status: DC

## 2021-09-25 NOTE — Telephone Encounter (Signed)
Pt called and is requesting a refill on his adderall and his nizoral shampoo please send to the [id number] - [place], [place] AT [address] ?

## 2021-09-28 ENCOUNTER — Ambulatory Visit: Payer: Medicaid Other | Admitting: Nurse Practitioner

## 2021-09-28 ENCOUNTER — Encounter: Payer: Self-pay | Admitting: Nurse Practitioner

## 2021-09-28 VITALS — BP 132/84 | HR 76 | Ht 74.0 in | Wt 252.6 lb

## 2021-09-28 DIAGNOSIS — K5900 Constipation, unspecified: Secondary | ICD-10-CM | POA: Diagnosis not present

## 2021-09-28 DIAGNOSIS — R1310 Dysphagia, unspecified: Secondary | ICD-10-CM

## 2021-09-28 NOTE — Patient Instructions (Addendum)
You have been scheduled for a EGD. Please follow the written instructions given to you at your visit today. ?If you use inhalers (even only as needed), please bring them with you on the day of your procedure. ? ?RECOMMENDATIONS: ? ?Miralax- Dissolve one capful in 8 ounces of water and drink before bed to increase stool output. ?Benefiber- 1 tablespoon daily. ?Follow up with your primary care provider within 1-2 months to recheck you thyroid. ? ?Thank you for trusting me with your gastrointestinal care!   ? ?Noralyn Pick, CRNP ? ? ? ?BMI: ? ?If you are age 75 or older, your body mass index should be between 23-30. Your Body mass index is 32.43 kg/m?Marland Kitchen If this is out of the aforementioned range listed, please consider follow up with your Primary Care Provider. ? ?If you are age 27 or younger, your body mass index should be between 19-25. Your Body mass index is 32.43 kg/m?Marland Kitchen If this is out of the aformentioned range listed, please consider follow up with your Primary Care Provider.  ? ?MY CHART: ? ?The [facility] providers would like to encourage you to use [facility] to communicate with providers for non-urgent requests or questions.  Due to long hold times on the telephone, sending your provider a message by [facility] may be a faster and more efficient way to get a response.  Please allow 48 business hours for a response.  Please remember that this is for non-urgent requests.  ? ?

## 2021-09-28 NOTE — Progress Notes (Signed)
? ? ? ?09/28/2021 ?[name] ?[id number] ?1985-11-25 ? ? ?CHIEF COMPLAINT: Difficulty swallowing  ? ?HISTORY OF PRESENT ILLNESS: [name]. [name] is a 36 year old male with a past medical history of anxiety, depression, obesity, sleep apnea uses CPAP and GERD.  He presents to our office today as referred by [name] PA-C for further evaluation regarding acid reflux symptoms with dysphagia.  He underwent an EGD by Dr. [name] 03/21/2015 which showed reactive gastropathy.  He was prescribed Protonix 40 mg once daily which he continued until one month ago after his PCP switched him to Omeprazole 40 mg once daily and added Famotidine 20 mg nightly.  He complains of having a burning throat pain with dysphagia which started 2 months ago.  He describes episodes of food getting stuck to the throat/upper esophagus which results in gagging up the food, sometimes the food goes down spontaneously and other times he drinks a few sips of water and the food passes down.  He is also experiencing increased burping.  He has infrequent upper abdominal discomfort.  He had nausea for 1 day recently which abated.  No vomiting.  No NSAID use.  No alcohol use.  He is passing a normal formed brown bowel movement most days, he does not always feel emptied.  He sometimes feels as if there is a rock sitting in the center of his abdomen.  No rectal bleeding or black stools. ? ? ?  Latest Ref Rng & Units 06/13/2021  ?  2:57 PM 05/08/2020  ?  9:00 AM 07/14/2019  ? 12:34 AM  ?CBC  ?WBC 3.4 - 10.8 x10E3/uL 5.1   6.0   9.0    ?Hemoglobin 13.0 - 17.7 g/dL 16.2   16.4   16.6    ?Hematocrit 37.5 - 51.0 % 49.0   47.9   49.8    ?Platelets 150 - 450 x10E3/uL 324   327   276    ?  ? ? ?  Latest Ref Rng & Units 06/13/2021  ?  2:57 PM 05/08/2020  ?  9:00 AM 07/14/2019  ? 12:34 AM  ?CMP  ?Glucose 70 - 99 mg/dL 93   99   96    ?BUN 6 - 20 mg/dL '13   14   13    '$ ?Creatinine 0.76 - 1.27 mg/dL 1.29   1.22   1.04    ?Sodium 134 - 144 mmol/L 141   142    138    ?Potassium 3.5 - 5.2 mmol/L 4.9   4.7   3.9    ?Chloride 96 - 106 mmol/L 102   104   104    ?CO2 20 - 29 mmol/L '27   23   24    '$ ?Calcium 8.7 - 10.2 mg/dL 10.2   10.1   9.5    ?Total Protein 6.0 - 8.5 g/dL 7.2    8.0    ?Total Bilirubin 0.0 - 1.2 mg/dL 0.4    0.7    ?Alkaline Phos 44 - 121 IU/L 50    45    ?AST 0 - 40 IU/L 20    24    ?ALT 0 - 44 IU/L 33    42    ?  ?EGD 03/21/2015 by Dr. [name]:  ?1. Gastritis in the gastric body and gastric antrum; multiple biopsies performed ?2. The EGD otherwise appeared normal ?REACTIVE GASTROPATHY ?NO HELICOBACTER PYLORI ORGANISM ARE IDENTIFIED (WARTHIN-STARRY STAIN) ? ?Past Medical History:  ?Diagnosis Date  ?  ADD (attention deficit disorder)   ? Anxiety   ? Depression   ? Gastritis 03/2015  ? EGD Dr. [name]  ? GERD (gastroesophageal reflux disease)   ? H/O echocardiogram 10/2007  ? normal stress echo, Dr. Jenkins Rouge  ? Learning disability   ? Obesity   ? 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 Sleep apnea   ? Tinea corporis   ? Wears glasses   ? ?Past Surgical History:  ?Procedure Laterality Date  ? ESOPHAGOGASTRODUODENOSCOPY  03/2015  ? gastritis; Dr. [name]  ? ?Social History: He is single.  Non-smoker.  No alcohol use.  No drug use. ? ?Family History: family history includes Cancer in his paternal grandmother; Cancer (age of onset: 55) in his father; Deep vein thrombosis in his mother; Diabetes in his paternal aunt; Heart disease in his paternal uncle; Hypertension in his mother; Multiple sclerosis in his father; Prostate cancer in his paternal grandfather. ? ?Allergies  ?Allergen Reactions  ? Grass Extracts [Gramineae Pollens] Swelling  ? ? ?  ?Outpatient Encounter Medications as of 09/28/2021  ?Medication Sig  ? amphetamine-dextroamphetamine (ADDERALL) 30 MG tablet Take 1 tablet by mouth daily.  ? famotidine (PEPCID) 20 MG tablet Take 1 tablet (20 mg total) by mouth at bedtime.  ? hydrocortisone 2.5 % lotion Apply topically daily as needed.  ?  ketoconazole (NIZORAL) 2 % shampoo Apply topically 2 (two) times a week.  ? omeprazole (PRILOSEC) 40 MG capsule Take 1 capsule (40 mg total) by mouth daily.  ? VITAMIN D PO Take 1 tablet by mouth daily.  ? [DISCONTINUED] amphetamine-dextroamphetamine (ADDERALL) 30 MG tablet Take 1 tablet by mouth daily.  ? [DISCONTINUED] amphetamine-dextroamphetamine (ADDERALL) 30 MG tablet Take 1 tablet by mouth 2 (two) times daily.  ? [DISCONTINUED] mupirocin nasal ointment (BACTROBAN) 2 % Place 1 application into the nose 2 (two) times daily. Use one-half of tube in each nostril twice daily for five (5) days. After application, press sides of nose together and gently massage.  ? [DISCONTINUED] sucralfate (CARAFATE) 1 g tablet Take 1 tablet (1 g total) by mouth 4 (four) times daily -  with meals and at bedtime. (Patient not taking: Reported on 09/28/2021)  ? ?No facility-administered encounter medications on file as of 09/28/2021.  ? ? ?REVIEW OF SYSTEMS:  ?Gen: Denies fever, sweats or chills. No weight loss.  ?CV: Denies chest pain, palpitations or edema. ?Resp: + Cough. No hemoptysis.  ?GI: See HPI. ?GU : Denies urinary burning, blood in urine, increased urinary frequency or incontinence. ?MS: Denies joint pain, muscles aches or weakness. ?Derm: Denies rash, itchiness, skin lesions or unhealing ulcers. ?Psych: + Anxiety and depression.  ?Heme: Denies bruising, easy bleeding. ?Neuro:  Denies headaches, dizziness or paresthesias. ?Endo:  Denies any problems with DM, thyroid or adrenal function. ? ?PHYSICAL EXAM: ?BP 132/84   Pulse 76   Ht '6\' 2"'$  (1.88 m)   Wt 252 lb 9.6 oz (114.6 kg)   SpO2 98%   BMI 32.43 kg/m?  ?General: 36 year old male in no acute distress. ?Head: Normocephalic and atraumatic. ?Eyes:  Sclerae non-icteric, conjunctive pink. ?Ears: Normal auditory acuity. ?Mouth: Dentition intact. No ulcers or lesions.  ?Neck: Supple. Thick neck, right thyroid lobe > left without discrete nodules.  ?Lungs: Clear bilaterally to  auscultation without wheezes, crackles or rhonchi. ?Heart: Regular rate and rhythm. No murmur, rub or gallop appreciated.  ?Abdomen: Soft, non distended.  Mild periumbilical tenderness without rebound or guarding.  No masses. No hepatosplenomegaly. Normoactive bowel  sounds x 4 quadrants.  ?Rectal: Deferred. ?Musculoskeletal: Symmetrical with no gross deformities. ?Skin: Warm and dry. No rash or lesions on visible extremities. ?Extremities: No edema. ?Neurological: Alert oriented x 4, no focal deficits.  ?Psychological:  Alert and cooperative. Normal mood and affect. ? ?ASSESSMENT AND PLAN: ? ?70) 36 year old male with history of GERD with new onset dysphagia x 2 months ?-Continue Omeprazole 40 mg daily and Famotidine 1 tab nightly ?-EGD benefits and risks discussed including risk with sedation, risk of bleeding, perforation and infection  ?-Patient instructed to drink 3 separate sips of water prior to eating any food, cut food into small pieces, chew food thoroughly and to avoid eating large pieces of meat, bread or rice ?-Patient to contact her office if his symptoms worsen ?-Further recommendations to be determined after EGD completed ? ?2) Constipation with associated central abdominal discomfort ?-MiraLAX nightly to increase stool output ?-Benefiber 1 tablespoon daily as tolerated ?-I discussed scheduling a CTAP if he continues to have central abdominal discomfort ? ?3) Questionable enlarged right thyroid lobe on exam  ?-Patient to follow-up with PCP within the next 1 to 2 months for repeat thyroid exam ? ? ? ? ? ? ? ?CC:  Tysinger, Camelia Eng, PA-C ? ? ? ?

## 2021-11-08 ENCOUNTER — Ambulatory Visit (AMBULATORY_SURGERY_CENTER): Payer: Medicaid Other | Admitting: Gastroenterology

## 2021-11-08 ENCOUNTER — Encounter: Payer: Self-pay | Admitting: Gastroenterology

## 2021-11-08 VITALS — BP 134/87 | HR 64 | Temp 97.5°F | Resp 22 | Ht 74.0 in | Wt 252.0 lb

## 2021-11-08 DIAGNOSIS — K2950 Unspecified chronic gastritis without bleeding: Secondary | ICD-10-CM

## 2021-11-08 DIAGNOSIS — R1310 Dysphagia, unspecified: Secondary | ICD-10-CM

## 2021-11-08 DIAGNOSIS — K2100 Gastro-esophageal reflux disease with esophagitis, without bleeding: Secondary | ICD-10-CM | POA: Diagnosis not present

## 2021-11-08 DIAGNOSIS — K219 Gastro-esophageal reflux disease without esophagitis: Secondary | ICD-10-CM

## 2021-11-08 DIAGNOSIS — K2970 Gastritis, unspecified, without bleeding: Secondary | ICD-10-CM

## 2021-11-08 MED ORDER — SODIUM CHLORIDE 0.9 % IV SOLN: 500.0000 mL | Freq: Once | INTRAVENOUS | Status: DC

## 2021-11-08 MED ORDER — OMEPRAZOLE 40 MG PO CPDR: 40.0000 mg | DELAYED_RELEASE_CAPSULE | Freq: Two times a day (BID) | ORAL | 11 refills | Status: DC

## 2021-11-08 MED ORDER — FAMOTIDINE 40 MG PO TABS: 40.0000 mg | ORAL_TABLET | Freq: Every day | ORAL | 11 refills | Status: DC

## 2021-11-08 NOTE — Progress Notes (Signed)
History & Physical  Primary Care Physician:  Carlena Hurl, PA-C Primary Gastroenterologist: Lucio Edward, MD  CHIEF COMPLAINT:  GERD, dysphagia   HPI: [name] is a 36 y.o. male with GERD and dysphagia for EGD.   Past Medical History:  Diagnosis Date   ADD (attention deficit disorder)    Anxiety    Depression    Gastritis 03/2015   EGD Dr. [name]   GERD (gastroesophageal reflux disease)    H/O echocardiogram 10/2007   normal stress echo, Dr. 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 [name]    Prior to Admission medications   Medication Sig Start Date End Date Taking? Authorizing Provider  amphetamine-dextroamphetamine (ADDERALL) 30 MG tablet Take 1 tablet by mouth daily. 12/01/20  Yes Tysinger, Camelia Eng, PA-C  hydrocortisone 2.5 % lotion Apply topically daily as needed. 10/10/20  Yes [provider]  ketoconazole (NIZORAL) 2 % shampoo Apply topically 2 (two) times a week. 09/27/21  Yes Tysinger, Camelia Eng, PA-C  omeprazole (PRILOSEC) 40 MG capsule Take 1 capsule (40 mg total) by mouth daily. 08/22/21  Yes Tysinger, Camelia Eng, PA-C  VITAMIN D PO Take 1 tablet by mouth daily.   Yes [provider]  famotidine (PEPCID) 20 MG tablet Take 1 tablet (20 mg total) by mouth at bedtime. 08/22/21   Tysinger, Camelia Eng, PA-C    Current Outpatient Medications  Medication Sig Dispense Refill   amphetamine-dextroamphetamine (ADDERALL) 30 MG tablet Take 1 tablet by mouth daily. 60 tablet 0   hydrocortisone 2.5 % lotion Apply topically daily as needed.     ketoconazole (NIZORAL) 2 % shampoo Apply topically 2 (two) times a week. 120 mL 2   omeprazole (PRILOSEC) 40 MG capsule Take 1 capsule (40 mg total) by mouth daily. 30 capsule 0   VITAMIN  D PO Take 1 tablet by mouth daily.     famotidine (PEPCID) 20 MG tablet Take 1 tablet (20 mg total) by mouth at bedtime. 30 tablet 1   Current Facility-Administered Medications  Medication Dose Route Frequency Provider Last Rate Last Admin   0.9 %  sodium chloride infusion  500 mL Intravenous Once Ladene Artist, MD        Allergies as of 11/08/2021 - Review Complete 11/08/2021  Allergen Reaction Noted   Grass extracts [gramineae pollens] Swelling 12/29/2017    Family History  Problem Relation Age of Onset   Hypertension Mother    Deep vein thrombosis Mother    Cancer Father 75       father   Multiple sclerosis Father    Cancer Paternal Grandmother        uterine   Prostate cancer Paternal Grandfather    Diabetes Paternal Aunt    Heart disease Paternal Uncle    Stroke Neg Hx    Colon cancer Neg Hx    Stomach cancer Neg Hx    Esophageal cancer Neg Hx     Social History   Socioeconomic History   Marital status: Single    Spouse name: Not on file   Number of children: 0   Years of education: Not on file   Highest education level: Associate degree: academic program  Occupational History  Comment: culinary arts  Tobacco Use   Smoking status: Never   Smokeless tobacco: Never  Vaping Use   Vaping Use: Never used  Substance and Sexual Activity   Alcohol use: No   Drug use: No   Sexual activity: Yes    Partners: Female  Other Topics Concern   Not on file  Social History Narrative   Lives with father.   Has degree from [employer] for [employer], does modeling and acting on the side.   Exercise- some weights, walking.  05/2021   Social Determinants of Health   Financial Resource Strain: Not on file  Food Insecurity: Not on file  Transportation Needs: Not on file  Physical Activity: Not on file  Stress: Not on file  Social Connections: Not on file  Intimate Partner Violence: Not on file    Review of Systems:  All systems reviewed an negative except where noted  in HPI.  Gen: Denies any fever, chills, sweats, anorexia, fatigue, weakness, malaise, weight loss, and sleep disorder CV: Denies chest pain, angina, palpitations, syncope, orthopnea, PND, peripheral edema, and claudication. Resp: Denies dyspnea at rest, dyspnea with exercise, cough, sputum, wheezing, coughing up blood, and pleurisy. GI: Denies vomiting blood, jaundice, and fecal incontinence.   Denies dysphagia or odynophagia. GU : Denies urinary burning, blood in urine, urinary frequency, urinary hesitancy, nocturnal urination, and urinary incontinence. MS: Denies joint pain, limitation of movement, and swelling, stiffness, low back pain, extremity pain. Denies muscle weakness, cramps, atrophy.  Derm: Denies rash, itching, dry skin, hives, moles, warts, or unhealing ulcers.  Psych: Denies depression, anxiety, memory loss, suicidal ideation, hallucinations, paranoia, and confusion. Heme: Denies bruising, bleeding, and enlarged lymph nodes. Neuro:  Denies any headaches, dizziness, paresthesias. Endo:  Denies any problems with DM, thyroid, adrenal function.   Physical Exam: General:  Alert, well-developed, in NAD Head:  Normocephalic and atraumatic. Eyes:  Sclera clear, no icterus.   Conjunctiva pink. Ears:  Normal auditory acuity. Mouth:  No deformity or lesions.  Neck:  Supple; no masses . Lungs:  Clear throughout to auscultation.   No wheezes, crackles, or rhonchi. No acute distress. Heart:  Regular rate and rhythm; no murmurs. Abdomen:  Soft, nondistended, nontender. No masses, hepatomegaly. No obvious masses.  Normal bowel .    Rectal:  Deferred   Msk:  Symmetrical without gross deformities.. Pulses:  Normal pulses noted. Extremities:  Without edema. Neurologic:  Alert and  oriented x4;  grossly normal neurologically. Skin:  Intact without significant lesions or rashes. Cervical Nodes:  No significant cervical adenopathy. Psych:  Alert and cooperative. Normal mood and  affect.  Impression / Plan:   GERD and dysphagia for EGD.  Pricilla Riffle. [name]  11/08/2021, 9:36 AM See [name], [facility], to contact our on call provider

## 2021-11-08 NOTE — Progress Notes (Signed)
°  Vs by DT in adm ° °Pt's states no medical or surgical changes since previsit or office visit.  °

## 2021-11-08 NOTE — Patient Instructions (Signed)
Await pathology  Please read over handouts about gastritis, esophagitis, and anti-reflux measures  Follow up with [name] in office Thursday 12-20-21 at 10:00 am.    Change Omeprazole 40 mg to twice daily- take 30 minutes before breakfast and supper Change Famotidine to 40 mg at bedtime  Diet- clear liquids for 2 hours, then soft diet the rest of today- see handout.  Resume normal diet tomorrow   YOU HAD AN ENDOSCOPIC PROCEDURE TODAY AT [facility]:   Refer to the procedure report that was given to you for any specific questions about what was found during the examination.  If the procedure report does not answer your questions, please call your gastroenterologist to clarify.  If you requested that your care partner not be given the details of your procedure findings, then the procedure report has been included in a sealed envelope for you to review at your convenience later.  YOU SHOULD EXPECT: Some feelings of bloating in the abdomen. Passage of more gas than usual.  Walking can help get rid of the air that was put into your GI tract during the procedure and reduce the bloating. I Please Note:  You might notice some irritation and congestion in your nose or some drainage.  This is from the oxygen used during your procedure.  There is no need for concern and it should clear up in a day or so.  SYMPTOMS TO REPORT IMMEDIATELY:  Following upper endoscopy (EGD)  Vomiting of blood or coffee ground material  New chest pain or pain under the shoulder blades  Painful or persistently difficult swallowing  New shortness of breath  Fever of 100F or higher  Black, tarry-looking stools  For urgent or emergent issues, a gastroenterologist can be reached at any hour by calling [phone]. Do not use MyChart messaging for urgent concerns.    DIET:  follow dilation diet as above.  Drink plenty of fluids but you should avoid alcoholic beverages for 24 hours.  ACTIVITY:  You  should plan to take it easy for the rest of today and you should NOT DRIVE or use heavy machinery until tomorrow (because of the sedation medicines used during the test).    FOLLOW UP: Our staff will call the number listed on your records 24-72 hours following your procedure to check on you and address any questions or concerns that you may have regarding the information given to you following your procedure. If we do not reach you, we will leave a message.  We will attempt to reach you two times.  During this call, we will ask if you have developed any symptoms of COVID 19. If you develop any symptoms (ie: fever, flu-like symptoms, shortness of breath, cough etc.) before then, please call [phone].  If you test positive for Covid 19 in the 2 weeks post procedure, please call and report this information to [place].    If any biopsies were taken you will be contacted by phone or by letter within the next 1-3 weeks.  Please call us at [phone] if you have not heard about the biopsies in 3 weeks.    SIGNATURES/CONFIDENTIALITY: You and/or your care partner have signed paperwork which will be entered into your electronic medical record.  These signatures attest to the fact that that the information above on your After Visit Summary has been reviewed and is understood.  Full responsibility of the confidentiality of this discharge information lies with you and/or your care-partner.

## 2021-11-08 NOTE — Progress Notes (Signed)
Called to room to assist during endoscopic procedure.  Patient ID and intended procedure confirmed with present staff. Received instructions for my participation in the procedure from the performing physician.  

## 2021-11-08 NOTE — Op Note (Signed)
[facility] Patient Name: [name] Procedure Date: 11/08/2021 9:33 AM MRN: [id number] Endoscopist: Ladene Artist , MD Age: 36 Referring MD:  Date of Birth: Nov 21, 1985 Gender: Male Account #: [id number] Procedure:                Upper GI endoscopy Indications:              Dysphagia, Gastroesophageal reflux disease Medicines:                Monitored Anesthesia Care Procedure:                Pre-Anesthesia Assessment:                           - Prior to the procedure, a History and Physical                            was performed, and patient medications and                            allergies were reviewed. The patient's tolerance of                            previous anesthesia was also reviewed. The risks                            and benefits of the procedure and the sedation                            options and risks were discussed with the patient.                            All questions were answered, and informed consent                            was obtained. Prior Anticoagulants: The patient has                            taken no previous anticoagulant or antiplatelet                            agents. ASA Grade Assessment: II - A patient with                            mild systemic disease. After reviewing the risks                            and benefits, the patient was deemed in                            satisfactory condition to undergo the procedure.                           After obtaining informed consent, the endoscope was  passed under direct vision. Throughout the                            procedure, the patient's blood pressure, pulse, and                            oxygen saturations were monitored continuously. The                            Endoscope was introduced through the mouth, and                            advanced to the second part of duodenum. The upper                            GI  endoscopy was accomplished without difficulty.                            The patient tolerated the procedure well. Scope In: Scope Out: Findings:                 LA Grade B (one or more mucosal breaks greater than                            5 mm, not extending between the tops of two mucosal                            folds) esophagitis with no bleeding was found at                            the gastroesophageal junction. Biopsies were taken                            with a cold forceps for histology.                           No endoscopic abnormality was evident in the                            esophagus to explain the patient's complaint of                            dysphagia. It was decided, however, to proceed with                            dilation of the entire esophagus. A guidewire was                            placed and the scope was withdrawn. Dilation was                            performed with a Savary dilator with no resistance  at 17 mm.                           Diffuse mild inflammation characterized by                            congestion (edema) and erythema was found in the                            entire examined stomach. Biopsies were taken with a                            cold forceps for histology.                           The exam of the stomach was otherwise normal.                           The duodenal bulb and second portion of the                            duodenum were normal. Complications:            No immediate complications. Estimated Blood Loss:     Estimated blood loss was minimal. Impression:               - LA Grade B reflux esophagitis with no bleeding.                            Biopsied.                           - No endoscopic esophageal abnormality to explain                            patient's dysphagia. Esophagus dilated.                           - Gastritis. Biopsied.                           -  Normal duodenal bulb and second portion of the                            duodenum. Recommendation:           - Patient has a contact number available for                            emergencies. The signs and symptoms of potential                            delayed complications were discussed with the                            patient. Return to normal activities tomorrow.  Written discharge instructions were provided to the                            patient.                           - Clear liquid diet for 2 hours, then advance as                            tolerated to soft diet today.                           - Resume prior diet tomorrow.                           - Follow antireflux measures.                           - Continue present medications.                           - Change omeprazole to 40 mg po bid, 1 year of                            refills.                           - Change famotidine to 40 mg po hs, 1 year of                            refills.                           - Await pathology results.                           - Return to GI office in 6 weeks with me or JL. Ladene Artist, MD 11/08/2021 10:00:12 AM This report has been signed electronically.

## 2021-11-08 NOTE — Progress Notes (Signed)
A and O x3. Report to RN. Tolerated MAC anesthesia well.Teeth unchanged after procedure. 

## 2021-11-09 ENCOUNTER — Telehealth: Payer: Self-pay

## 2021-11-25 ENCOUNTER — Encounter: Payer: Self-pay | Admitting: Gastroenterology

## 2021-12-14 ENCOUNTER — Encounter: Payer: Self-pay | Admitting: Gastroenterology

## 2021-12-20 ENCOUNTER — Ambulatory Visit (INDEPENDENT_AMBULATORY_CARE_PROVIDER_SITE_OTHER): Payer: Medicaid Other | Admitting: Physician Assistant

## 2021-12-20 ENCOUNTER — Encounter: Payer: Self-pay | Admitting: Physician Assistant

## 2021-12-20 VITALS — BP 162/82 | HR 80 | Ht 74.0 in | Wt 256.0 lb

## 2021-12-20 DIAGNOSIS — K5900 Constipation, unspecified: Secondary | ICD-10-CM | POA: Diagnosis not present

## 2021-12-20 DIAGNOSIS — R1310 Dysphagia, unspecified: Secondary | ICD-10-CM

## 2021-12-20 DIAGNOSIS — K2100 Gastro-esophageal reflux disease with esophagitis, without bleeding: Secondary | ICD-10-CM

## 2021-12-20 NOTE — Patient Instructions (Addendum)
If you are age 35 or younger, your body mass index should be between 19-25. Your Body mass index is 32.87 kg/m. If this is out of the aformentioned range listed, please consider follow up with your Primary Care Provider.  ________________________________________________________  The [facility] providers would like to encourage you to use [facility] to communicate with providers for non-urgent requests or questions.  Due to long hold times on the telephone, sending your provider a message by [facility] may be a faster and more efficient way to get a response.  Please allow 48 business hours for a response.  Please remember that this is for non-urgent requests.  _______________________________________________________  [name] will need a follow up appointment in 1 year (August 2024). We will contact you to schedule this appointment.  Thank you for entrusting me with your care and choosing [facility].  Ellouise Newer, PA-C

## 2021-12-20 NOTE — Progress Notes (Signed)
Chief Complaint: Follow-up after EGD  HPI:    [name] is a 36 year old African-American male with a past medical history as listed below including reflux, known to Dr. [name], who presents to clinic today for follow-up after recent EGD.    09/28/2021 patient seen in clinic by Carl Best, NP for difficulty swallowing.  At that time continue Omeprazole 40 mg daily and Famotidine 1 tab nightly.  Schedule patient for an EGD.  Also recommended MiraLAX for constipation.    11/08/2021 EGD with LA grade B reflux esophagitis, esophagus was empirically dilated, gastritis.  Recommendations to change omeprazole to 40 mg p.o. twice daily and Famotidine to 40 mg p.o. at bedtime.  Biopsy showed mild chronic inactive gastritis.    Today, the patient tells me that he is doing perfectly well.  Tells me he still has some lingering sensation of something being in his throat but is getting better day by day.  He does not think he needs any further evaluation.  He continues on Omeprazole 40 mg twice a day and Famotidine 40 mg at night.  Denies any trouble with his bowels.    Denies fever, chills, weight loss, blood in his stool or symptoms that awaken him from sleep.  Past Medical History:  Diagnosis Date   ADD (attention deficit disorder)    Anxiety    Depression    Gastritis 03/2015   EGD Dr. [name]   GERD (gastroesophageal reflux disease)    H/O echocardiogram 10/2007   normal stress echo, Dr. 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 [name]    Current Outpatient Medications  Medication Sig Dispense Refill   amphetamine-dextroamphetamine (ADDERALL) 30 MG tablet Take 1 tablet by mouth daily. 60 tablet 0   famotidine (PEPCID) 40 MG tablet Take 1 tablet (40 mg total)  by mouth at bedtime. 30 tablet 11   hydrocortisone 2.5 % lotion Apply topically daily as needed.     ketoconazole (NIZORAL) 2 % shampoo Apply topically 2 (two) times a week. 120 mL 2   omeprazole (PRILOSEC) 40 MG capsule Take 1 capsule (40 mg total) by mouth 2 (two) times daily. 60 capsule 11   VITAMIN D PO Take 1 tablet by mouth daily.     No current facility-administered medications for this visit.    Allergies as of 12/20/2021 - Review Complete 12/20/2021  Allergen Reaction Noted   Grass extracts [gramineae pollens] Swelling 12/29/2017    Family History  Problem Relation Age of Onset   Hypertension Mother    Deep vein thrombosis Mother    Cancer Father 59       father   Multiple sclerosis Father    Cancer Paternal Grandmother        uterine   Prostate cancer Paternal Grandfather    Diabetes Paternal Aunt    Heart disease Paternal Uncle    Stroke Neg Hx    Colon cancer Neg Hx    Stomach cancer Neg Hx    Esophageal cancer Neg Hx     Social History   Socioeconomic History   Marital status: Single    Spouse name: Not on file   Number of children: 0   Years of education: Not on  file   Highest education level: Associate degree: academic program  Occupational History    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   Has degree from [employer] for [employer], does modeling and acting on the side.   Exercise- some weights, walking.  05/2021   Social Determinants of Health   Financial Resource Strain: Not on file  Food Insecurity: Not on file  Transportation Needs: Not on file  Physical Activity: Not on file  Stress: Not on file  Social Connections: Not on file  Intimate Partner Violence: Not on  file    Review of Systems:    Constitutional: No weight loss, fever or chills Cardiovascular: No chest pain Respiratory: No SOB  Gastrointestinal: See HPI and otherwise negative   Physical Exam:  Vital signs: BP (!) 162/82   Pulse 80   Ht '6\' 2"'$  (1.88 m)   Wt 256 lb (116.1 kg)   BMI 32.87 kg/m   Constitutional:   Pleasant overweight AA male appears to be in NAD, Well developed, Well nourished, alert and cooperative Respiratory: Respirations even and unlabored. Lungs clear to auscultation bilaterally.   No wheezes, crackles, or rhonchi.  Cardiovascular: Normal S1, S2. No MRG. Regular rate and rhythm. No peripheral edema, cyanosis or pallor.  Gastrointestinal:  Soft, nondistended, nontender. No rebound or guarding. Normal bowel sounds. No appreciable masses or hepatomegaly. Rectal:  Not performed.  Psychiatric: Oriented to person, place and time. Demonstrates good judgement and reason without abnormal affect or behaviors.  RELEVANT LABS AND IMAGING: CBC    Component Value Date/Time   WBC 5.1 06/13/2021 1457   WBC 9.0 07/14/2019 0034   RBC 5.87 (H) 06/13/2021 1457   RBC 5.90 (H) 07/14/2019 0034   HGB 16.2 06/13/2021 1457   HCT 49.0 06/13/2021 1457   PLT 324 06/13/2021 1457   MCV 84 06/13/2021 1457   MCH 27.6 06/13/2021 1457   MCH 28.1 07/14/2019 0034   MCHC 33.1 06/13/2021 1457   MCHC 33.3 07/14/2019 0034   RDW 13.2 06/13/2021 1457   LYMPHSABS 3.9 (H) 05/08/2020 0900   MONOABS 0.8 07/14/2019 0034   EOSABS 0.1 05/08/2020 0900   BASOSABS 0.0 05/08/2020 0900    CMP     Component Value Date/Time   NA 141 06/13/2021 1457   K 4.9 06/13/2021 1457   CL 102 06/13/2021 1457   CO2 27 06/13/2021 1457   GLUCOSE 93 06/13/2021 1457   GLUCOSE 96 07/14/2019 0034   BUN 13 06/13/2021 1457   CREATININE 1.29 (H) 06/13/2021 1457   CREATININE 1.03 07/21/2015 0001   CALCIUM 10.2 06/13/2021 1457   PROT 7.2 06/13/2021 1457   ALBUMIN 4.8 06/13/2021 1457   AST 20 06/13/2021 1457   ALT  33 06/13/2021 1457   ALKPHOS 50 06/13/2021 1457   BILITOT 0.4 06/13/2021 1457   GFRNONAA 77 05/08/2020 0900   GFRAA 89 05/08/2020 0900    Assessment: 1.  GERD with esophagitis: A little bit of lingering globus sensation, but improving every day per patient, recent EGD with esophagitis and gastritis 2.  Dysphagia: EGD with empiric dilation, symptoms resolved 3.  Constipation: Resolved  Plan: 1.  Symptoms have resolved on current medications.  Continue Omeprazole 40 mg twice daily, 30-60 minutes before breakfast and dinner.  Patient tells me he does not need refills now but they would be good for a year from now. 2.  Continue Famotidine 40 mg nightly 3.  Patient to follow in clinic with [place] as needed or in a year for further refills.  Ellouise Newer, PA-C [facility] 12/20/2021, 10:03 AM  Cc: Carlena Hurl, PA-C

## 2022-01-23 ENCOUNTER — Encounter: Payer: Self-pay | Admitting: Internal Medicine

## 2022-02-05 NOTE — Progress Notes (Unsigned)
PATIENT: [name] DOB: Sep 28, 1985  REASON FOR VISIT: follow up HISTORY FROM: patient  No chief complaint on file.    HISTORY OF PRESENT ILLNESS:  02/05/22 ALL:  [name] returns for follow up for OSA on CPAP. He was last seen 07/2021 and had stopped therapy following a nose bleed.   08/06/2021 ALL: [name] returns for follow up for OSA on CPAP. He continues to work on meeting compliance with therapy. He has not used CPAP since 07/18/2021 due to having a nose bleed. He was afraid CPAP was contributing and has been hesitant to restart. No further episodes of nasal bleeding. He was seen by PCP. He does report concerns of seasonal allergies.       03/20/2021 ALL:  [name] is a 36 y.o. male here today for follow up for OSA on CPAP. He was seen by Dr Dohmeier in 05/2020 for difficulty initiating and maintaining sleep. HST 06/2020 showed severe OSA with total AHI of 31.9/hr. AutoPAP ordered and set up 12/2020. Since, he is doing well. He is adjusting to CPAP therapy. He admits that he doesn't always use therapy but does report feeling better when he does.     HISTORY: (copied from Dr Dohmeier's previous note)  [name] is a 36 y.o. year old  African- American male patient of Dr Gladstone Lighter and referred for a sleep consultation by PA Tysinger. seen  on 06/27/2020. Chief concern according to patient : " I am not able to sleep, I am always looking around, and easily woken up- I have a hard time with concentration , easily distracted. I am falling asleep when the sun rises and sleep into the afternoon".   [name] is a left-handed [place] American male with chronic insomnia, delayed sleep phase syndrome, sleeping in daytime . 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   The patient had the first sleep study in the year 2017  with no sleep time recorded. Advise was given to addre delayed sleep phase syndrome and address with psychology.    Sleep relevant medical history:  No ENT surgery, no Tonsillectomy.   Family medical /sleep history: no other family member on CPAP with OSA, insomnia, not aware of any psychiatric illness.  Social history:  Patient is working as a Engineer, maintenance (IT), Scientist, research (life sciences)- looking for work. Unemployed since 04-2018. He lives in a household alone.  Tobacco use- never .  ETOH use ; none , Caffeine intake in form of Soda( 1 a day)  Regular exercise ; weight lifting. Hobbies :cooking.    Sleep habits are as follows: The patient's dinner time is between 11 PM. The patient takes a hot shower, goes to bed at 12 PM and is awake - for hours, once asleep he  continues to sleep for 5-6  hours, wakes rarely for  bathroom breaks.  He has racing thoughts, incoherent thoughts. Watches T in bed, or reads.  The preferred sleep position is supine , with the support of 1 pillow.  Dreams are reportedly rare.  3-4  PM is the usual rise time. The patient wakes up spontaneously.  He reports not feeling refreshed or restored from sleep, with symptoms such as dry mouth, morning headaches, and residual fatigue.  Naps are taken infrequent.    REVIEW OF SYSTEMS: Out of a  complete 14 system review of symptoms, the patient complains only of the following symptoms, nose bleed, seasonal allergies  and all other reviewed systems are negative.  ESS: 7/24, previously 5/24, 7/24  ALLERGIES: Allergies  Allergen Reactions   Grass Extracts [Gramineae Pollens] Swelling    HOME MEDICATIONS: Outpatient Medications Prior to Visit  Medication Sig Dispense Refill   amphetamine-dextroamphetamine (ADDERALL) 30 MG tablet Take 1 tablet by mouth daily. 60 tablet 0   famotidine (PEPCID) 40 MG tablet Take 1 tablet (40 mg total) by mouth at bedtime.  30 tablet 11   hydrocortisone 2.5 % lotion Apply topically daily as needed.     ketoconazole (NIZORAL) 2 % shampoo Apply topically 2 (two) times a week. 120 mL 2   omeprazole (PRILOSEC) 40 MG capsule Take 1 capsule (40 mg total) by mouth 2 (two) times daily. 60 capsule 11   VITAMIN D PO Take 1 tablet by mouth daily.     No facility-administered medications prior to visit.    PAST MEDICAL HISTORY: Past Medical History:  Diagnosis Date   ADD (attention deficit disorder)    Anxiety    Depression    Gastritis 03/2015   EGD Dr. [name]   GERD (gastroesophageal reflux disease)    H/O echocardiogram 10/2007   normal stress echo, Dr. 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 [name]    FAMILY HISTORY: Family History  Problem Relation Age of Onset   Hypertension Mother    Deep vein thrombosis Mother    Cancer Father 79       father   Multiple sclerosis Father    Cancer Paternal Grandmother        uterine   Prostate cancer Paternal Grandfather    Diabetes Paternal Aunt    Heart disease Paternal Uncle    Stroke Neg Hx    Colon cancer Neg Hx    Stomach cancer Neg Hx    Esophageal cancer Neg Hx     SOCIAL HISTORY: Social History   Socioeconomic History   Marital status: Single    Spouse name: Not on file   Number of children: 0   Years of education: Not on file   Highest education level: Associate degree: academic program  Occupational History    Comment: Health and safety inspector arts  Tobacco Use   Smoking status: Never   Smokeless tobacco: Never  Vaping Use   Vaping Use: Never used  Substance and Sexual Activity   Alcohol use: No   Drug use: No   Sexual activity: Yes    Partners: Female  Other Topics Concern   Not on file   Social History Narrative   Lives with father.   Has degree from [employer] for [employer], does modeling and acting on the side.   Exercise- some weights, walking.  05/2021   Social Determinants of Health   Financial Resource Strain: Not on file  Food Insecurity: Not on file  Transportation Needs: Not on file  Physical Activity: Not on file  Stress: Not on file  Social Connections: Not on file  Intimate Partner Violence: Not on file     PHYSICAL EXAM  There were no vitals filed for this visit.   There  is no height or weight on file to calculate BMI.  Generalized: Well developed, in no acute distress  Cardiology: normal rate and rhythm, no murmur noted Respiratory: clear to auscultation bilaterally  Neurological examination  Mentation: Alert oriented to time, place, history taking. Follows all commands speech and language fluent Cranial nerve II-XII: Pupils were equal round reactive to light. Extraocular movements were full, visual field were full  Motor: The motor testing reveals 5 over 5 strength of all 4 extremities. Good symmetric motor tone is noted throughout.  Gait and station: Gait is normal.    DIAGNOSTIC DATA (LABS, IMAGING, TESTING) - I reviewed patient records, labs, notes, testing and imaging myself where available.      No data to display           Lab Results  Component Value Date   WBC 5.1 06/13/2021   HGB 16.2 06/13/2021   HCT 49.0 06/13/2021   MCV 84 06/13/2021   PLT 324 06/13/2021      Component Value Date/Time   NA 141 06/13/2021 1457   K 4.9 06/13/2021 1457   CL 102 06/13/2021 1457   CO2 27 06/13/2021 1457   GLUCOSE 93 06/13/2021 1457   GLUCOSE 96 07/14/2019 0034   BUN 13 06/13/2021 1457   CREATININE 1.29 (H) 06/13/2021 1457   CREATININE 1.03 07/21/2015 0001   CALCIUM 10.2 06/13/2021 1457   PROT 7.2 06/13/2021 1457   ALBUMIN 4.8 06/13/2021 1457   AST 20 06/13/2021 1457   ALT 33 06/13/2021 1457   ALKPHOS 50 06/13/2021 1457   BILITOT  0.4 06/13/2021 1457   GFRNONAA 77 05/08/2020 0900   GFRAA 89 05/08/2020 0900   Lab Results  Component Value Date   CHOL 242 (H) 06/13/2021   HDL 50 06/13/2021   LDLCALC 170 (H) 06/13/2021   TRIG 120 06/13/2021   CHOLHDL 4.8 06/13/2021   Lab Results  Component Value Date   HGBA1C 5.4 06/13/2021   No results found for: "VITAMINB12" Lab Results  Component Value Date   TSH 1.690 05/08/2020     ASSESSMENT AND PLAN 36 y.o. 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 here with   No diagnosis found.   [name] was doing fairly well on CPAP therapy but has been hesitant to resume use following a nose bleed on 07/18/2021. He has been evaluated by PCP and reports normal findings. BP is usually fairly normal. He does report feeling better when he uses CPAP. Compliance report for past 90 days reveals suboptimal compliance. He was 68% compliant with daily and 14% compliant with 4 hour usage. He was encouraged to continue using CPAP nightly and for greater than 4 hours each night. We will update supply orders as indicated. He was encouraged to try adjusting humidity settings and can try saline spray and or Aquaphor as needed. Risks of untreated sleep apnea review and education materials provided. Healthy lifestyle habits encouraged. He will follow up in 4-6 months, sooner if needed. He verbalizes understanding and agreement with this plan.    No orders of the defined types were placed in this encounter.     No orders of the defined types were placed in this encounter.      Debbora Presto, FNP-C 02/05/2022, 9:20 AM [facility] [address], [address]  [place], [place] [address] [phone]

## 2022-02-05 NOTE — Patient Instructions (Incomplete)
Please continue using your CPAP regularly. While your insurance requires that you use CPAP at least 4 hours each night on 70% of the nights, I recommend, that you not skip any nights and use it throughout the night if you can. Getting used to CPAP and staying with the treatment long term does take time and patience and discipline. Untreated obstructive sleep apnea when it is moderate to severe can have an adverse impact on cardiovascular health and raise her risk for heart disease, arrhythmias, hypertension, congestive heart failure, stroke and diabetes. Untreated obstructive sleep apnea causes sleep disruption, nonrestorative sleep, and sleep deprivation. This can have an impact on your day to day functioning and cause daytime sleepiness and impairment of cognitive function, memory loss, mood disturbance, and problems focussing. Using CPAP regularly can improve these symptoms.  Follow up in 6 months.  

## 2022-02-06 ENCOUNTER — Encounter: Payer: Self-pay | Admitting: Family Medicine

## 2022-02-06 ENCOUNTER — Ambulatory Visit: Payer: Medicaid Other | Admitting: Family Medicine

## 2022-02-06 VITALS — BP 135/93 | HR 73 | Ht 74.0 in | Wt 254.0 lb

## 2022-02-06 DIAGNOSIS — G4733 Obstructive sleep apnea (adult) (pediatric): Secondary | ICD-10-CM | POA: Diagnosis not present

## 2022-02-06 DIAGNOSIS — Z9989 Dependence on other enabling machines and devices: Secondary | ICD-10-CM

## 2022-02-06 NOTE — Progress Notes (Signed)
Faxed CPAP supplies order to [facility] at [phone]. Received fax confirmation.

## 2022-03-29 ENCOUNTER — Telehealth: Payer: Self-pay | Admitting: Medical

## 2022-03-29 DIAGNOSIS — L089 Local infection of the skin and subcutaneous tissue, unspecified: Secondary | ICD-10-CM

## 2022-03-29 NOTE — Telephone Encounter (Signed)
Pt called & has appt with [facility] for follow up for skin infection & needs referral as soon as possible, said Dermatologist called and needs this for appt on Monday.

## 2022-03-29 NOTE — Telephone Encounter (Signed)
done

## 2022-04-01 NOTE — Addendum Note (Signed)
Addended by: Minette Headland A on: 04/01/2022 12:33 PM   Modules accepted: Orders

## 2022-05-09 ENCOUNTER — Encounter: Payer: Self-pay | Admitting: Medical

## 2022-05-09 ENCOUNTER — Ambulatory Visit: Payer: Medicaid Other | Admitting: Medical

## 2022-05-09 VITALS — BP 122/86 | HR 85 | Temp 98.2°F | Resp 18 | Wt 253.8 lb

## 2022-05-09 DIAGNOSIS — E010 Iodine-deficiency related diffuse (endemic) goiter: Secondary | ICD-10-CM

## 2022-05-09 DIAGNOSIS — J988 Other specified respiratory disorders: Secondary | ICD-10-CM | POA: Diagnosis not present

## 2022-05-09 DIAGNOSIS — J4541 Moderate persistent asthma with (acute) exacerbation: Secondary | ICD-10-CM

## 2022-05-09 DIAGNOSIS — R051 Acute cough: Secondary | ICD-10-CM

## 2022-05-09 DIAGNOSIS — R058 Other specified cough: Secondary | ICD-10-CM

## 2022-05-09 MED ORDER — AMOXICILLIN 875 MG PO TABS: 875.0000 mg | ORAL_TABLET | Freq: Two times a day (BID) | ORAL | 0 refills | Status: AC

## 2022-05-09 MED ORDER — ALBUTEROL SULFATE HFA 108 (90 BASE) MCG/ACT IN AERS: 2.0000 | INHALATION_SPRAY | Freq: Four times a day (QID) | RESPIRATORY_TRACT | 0 refills | Status: DC | PRN

## 2022-05-09 NOTE — Progress Notes (Signed)
Subjective:  [name] is a 36 y.o. male who presents for Chief Complaint  Patient presents with   Ear Pain    Complains of bilateral ear pain, productive cough, sneezing and sore throat since 04/26/2022. Has tried Theraflu and Mucinex with no relief.   Sore Throat     Here for a few week history of respiratory issues.  He notes ongoing ear pain, cough, productive sputum, congestion, sore throat, throat pain about 7 out of 10, lots of cough.  No head pressure.  No fever, no nausea vomiting diarrhea.  No sick contacts.  He is not getting better despite using TheraFlu and Mucinex.  No body aches or chills.  Otherwise normal state of health  No other aggravating or relieving factors.    No other c/o.  The following portions of the patient's history were reviewed and updated as appropriate: allergies, current medications, past family history, past medical history, past social history, past surgical history and problem list.  ROS Otherwise as in subjective above  Objective: BP 122/86   Pulse 85   Temp 98.2 F (36.8 C) (Oral)   Resp 18   Wt 253 lb 12.8 oz (115.1 kg)   SpO2 97% Comment: room air  BMI 32.59 kg/m   General appearance: alert, no distress, well developed, well nourished HEENT: normocephalic, sclerae anicteric, conjunctiva pink and moist, TMs pearly, nares with some erythema and mucoid discharge or erythema, pharynx normal Oral cavity: MMM, no lesions Neck: supple, no lymphadenopathy, today there is some mild thyroid enlargement with asymmetry on the right side, no distinct nodule, no masses Heart: RRR, normal S1, S2, no murmurs Lungs: coarse, but no wheezes, rales, or rhonchi Pulses: 2+ radial pulses, 2+ pedal pulses, normal cap refill Ext: no edema   Assessment: Encounter Diagnoses  Name Primary?   Acute cough Yes   Moderate persistent reactive airway disease with acute exacerbation    Thyromegaly    Respiratory tract infection    Cough productive of  purulent sputum      Plan: Respiratory tract infection, cough, purulent sputum-begin antibiotic and inhaler below.  He had reactive airway before but no definite asthma diagnosis.  Hydrate well, can continue Mucinex over-the-counter next few days.  If not much improved or resolved within the next week then call recheck  New thyroid findings today-updated labs and thyroid ultrasound ordered today.  [name] was seen today for ear pain and sore throat.  Diagnoses and all orders for this visit:  Acute cough  Moderate persistent reactive airway disease with acute exacerbation  Thyromegaly -     TSH + free T4 -     US THYROID; Future  Respiratory tract infection  Cough productive of purulent sputum  Other orders -     amoxicillin (AMOXIL) 875 MG tablet; Take 1 tablet (875 mg total) by mouth 2 (two) times daily for 10 days. -     albuterol (VENTOLIN HFA) 108 (90 Base) MCG/ACT inhaler; Inhale 2 puffs into the lungs every 6 (six) hours as needed for wheezing or shortness of breath.    Follow up: Pending ultrasound and labs

## 2022-05-10 LAB — TSH+FREE T4
Free T4: 1.18 ng/dL (ref 0.82–1.77)
TSH: 0.838 u[IU]/mL (ref 0.450–4.500)

## 2022-05-10 NOTE — Progress Notes (Signed)
Results sent through MyChart

## 2022-05-22 ENCOUNTER — Ambulatory Visit (HOSPITAL_COMMUNITY)
Admission: RE | Admit: 2022-05-22 | Discharge: 2022-05-22 | Disposition: A | Discharge status: Home / Self Care | Payer: Medicaid Other | Source: Ambulatory Visit | Attending: Medical | Admitting: Medical

## 2022-05-22 ENCOUNTER — Other Ambulatory Visit: Payer: Self-pay | Admitting: Internal Medicine

## 2022-05-22 DIAGNOSIS — R9389 Abnormal findings on diagnostic imaging of other specified body structures: Secondary | ICD-10-CM

## 2022-05-22 DIAGNOSIS — E010 Iodine-deficiency related diffuse (endemic) goiter: Secondary | ICD-10-CM | POA: Insufficient documentation

## 2022-05-22 DIAGNOSIS — E049 Nontoxic goiter, unspecified: Secondary | ICD-10-CM

## 2022-05-22 NOTE — Progress Notes (Signed)
There is goiter tissue and some specific thyroid nodules.  The recommendation is fine-needle biopsy for further evaluation.  I recommend referral to endocrinology for further management of this.  Make sure he is agreeable and refer to endocrinology

## 2022-05-23 ENCOUNTER — Other Ambulatory Visit: Payer: Self-pay | Admitting: Medical

## 2022-06-17 ENCOUNTER — Encounter: Payer: Self-pay | Admitting: Medical

## 2022-06-17 ENCOUNTER — Ambulatory Visit (INDEPENDENT_AMBULATORY_CARE_PROVIDER_SITE_OTHER): Payer: Medicaid Other | Admitting: Medical

## 2022-06-17 VITALS — BP 124/84 | HR 102 | Resp 98 | Ht 74.0 in | Wt 257.8 lb

## 2022-06-17 DIAGNOSIS — Z23 Encounter for immunization: Secondary | ICD-10-CM | POA: Diagnosis not present

## 2022-06-17 DIAGNOSIS — Z0000 Encounter for general adult medical examination without abnormal findings: Secondary | ICD-10-CM | POA: Diagnosis not present

## 2022-06-17 DIAGNOSIS — Z13220 Encounter for screening for lipoid disorders: Secondary | ICD-10-CM

## 2022-06-17 DIAGNOSIS — Z8042 Family history of malignant neoplasm of prostate: Secondary | ICD-10-CM

## 2022-06-17 DIAGNOSIS — R051 Acute cough: Secondary | ICD-10-CM

## 2022-06-17 DIAGNOSIS — J029 Acute pharyngitis, unspecified: Secondary | ICD-10-CM

## 2022-06-17 DIAGNOSIS — Z131 Encounter for screening for diabetes mellitus: Secondary | ICD-10-CM

## 2022-06-17 DIAGNOSIS — E041 Nontoxic single thyroid nodule: Secondary | ICD-10-CM

## 2022-06-17 LAB — POCT URINALYSIS DIP (PROADVANTAGE DEVICE)
Bilirubin, UA: NEGATIVE
Blood, UA: NEGATIVE
Glucose, UA: NEGATIVE mg/dL
Leukocytes, UA: NEGATIVE
Nitrite, UA: NEGATIVE
Specific Gravity, Urine: 1.025
Urobilinogen, Ur: NEGATIVE
pH, UA: 6 (ref 5.0–8.0)

## 2022-06-17 LAB — LIPID PANEL

## 2022-06-17 MED ORDER — AZITHROMYCIN 250 MG PO TABS: ORAL_TABLET | ORAL | 0 refills | Status: DC

## 2022-06-17 MED ORDER — HYDROCOD POLI-CHLORPHE POLI ER 10-8 MG/5ML PO SUER: 5.0000 mL | Freq: Two times a day (BID) | ORAL | 0 refills | Status: DC

## 2022-06-17 NOTE — Progress Notes (Addendum)
Subjective:   HPI  [name] is a 37 y.o. male who presents for Chief Complaint  Patient presents with   fasting cpe    Fasting cpe, still having throat issues since December    Patient Care Team: Bret Vanessen, Leward Quan as PCP - General (Family Medicine) Nickholas Goldston, Camelia Eng, PA-C as Referring Physician (Family Medicine) Sees dentist Sees eye doctor Dermatology in [place], [place] Dr. Asencion Partridge Dohmeier and Debbora Presto, NP, neurology Prior psychiatry with Dr. Magda Kiel  Concerns: Here for well visit  I saw him in December for cough and congestion.  He says the symptoms never went away.  Still has some cough for the last sore throat.  He is drinking plenty of water throughout the day.  He wants a flu shot today.  He notes that his dad has a history of prostate cancer diagnosed in his late 27s.  Regarding thyroid ultrasound being abnormal prior consult in December he has appointment with endocrinology on June 27, 2022   Reviewed their medical, surgical, family, social, medication, and allergy history and updated chart as appropriate.  Past Medical History:  Diagnosis Date   ADD (attention deficit disorder)    Anxiety    Depression    Gastritis 03/2015   EGD Dr. [name]   GERD (gastroesophageal reflux disease)    H/O echocardiogram 10/2007   normal stress echo, Dr. 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 [name]    Family History  Problem Relation Age of Onset   Hypertension Mother    Deep vein thrombosis Mother    Cancer Father 54       father   Multiple sclerosis Father    Cancer Paternal Grandmother        uterine   Prostate cancer Paternal Grandfather    Diabetes Paternal Aunt    Heart disease Paternal Uncle    Stroke  Neg Hx    Colon cancer Neg Hx    Stomach cancer Neg Hx    Esophageal cancer Neg Hx      Current Outpatient Medications:    amphetamine-dextroamphetamine (ADDERALL) 30 MG tablet, Take 1 tablet by mouth daily., Disp: 60 tablet, Rfl: 0   azithromycin (ZITHROMAX) 250 MG tablet, 2 tablets day 1, then 1 tablet days 2-4, Disp: 6 tablet, Rfl: 0   chlorpheniramine-HYDROcodone (TUSSIONEX) 10-8 MG/5ML, Take 5 mLs by mouth 2 (two) times daily., Disp: 115 mL, Rfl: 0   famotidine (PEPCID) 40 MG tablet, Take 1 tablet (40 mg total) by mouth at bedtime., Disp: 30 tablet, Rfl: 11   hydrocortisone 2.5 % lotion, Apply topically daily as needed., Disp: , Rfl:    ketoconazole (NIZORAL) 2 % shampoo, Apply topically 2 (two) times a week., Disp: 120 mL, Rfl: 2   omeprazole (PRILOSEC) 40 MG capsule, Take 1 capsule (40 mg total) by mouth 2 (two) times daily., Disp: 60 capsule, Rfl: 11   VITAMIN D PO, Take 1 tablet by mouth daily., Disp: , Rfl:    albuterol (VENTOLIN HFA) 108 (90 Base) MCG/ACT inhaler, Inhale 2 puffs into the lungs every 6 (six) hours as needed for wheezing or shortness of breath. (Patient not taking: Reported on 06/17/2022), Disp: 8 g, Rfl: 0  Allergies  Allergen Reactions   Grass Extracts [Gramineae Pollens] Swelling   Review of Systems  Constitutional:  Negative for chills, fever, malaise/fatigue and weight loss.  HENT:  Positive for sore throat. Negative for congestion, ear pain, hearing loss and tinnitus.   Eyes:  Negative for blurred vision, pain and redness.  Respiratory:  Positive for cough. Negative for hemoptysis and shortness of breath.   Cardiovascular:  Negative for chest pain, palpitations, orthopnea, claudication and leg swelling.  Gastrointestinal:  Negative for abdominal pain, blood in stool, constipation, diarrhea, nausea and vomiting.  Genitourinary:  Negative for dysuria, flank pain, frequency, hematuria and urgency.  Musculoskeletal:  Negative for falls, joint pain and  myalgias.  Skin:  Negative for itching and rash.  Neurological:  Negative for dizziness, tingling, speech change, weakness and headaches.  Endo/Heme/Allergies:  Negative for polydipsia. Does not bruise/bleed easily.  Psychiatric/Behavioral:  Negative for depression and memory loss. The patient is not nervous/anxious and does not have insomnia.        06/17/2022    1:47 PM 09/12/2021   11:03 AM 06/13/2021    2:20 PM 02/14/2021   11:21 AM 11/01/2020    8:38 AM  Depression screen PHQ 2/9  Decreased Interest 0 0 0 0 2  Down, Depressed, Hopeless 0 0 0 0 1  PHQ - 2 Score 0 0 0 0 3  Altered sleeping  1 0 0 2  Tired, decreased energy  '1 1 1 3  '$ Change in appetite  0 0 0 0  Feeling bad or failure about yourself   0 0 0 1  Trouble concentrating  1 2 0 3  Moving slowly or fidgety/restless  1 0 0 2  Suicidal thoughts  0 0 0 1  PHQ-9 Score  '4 3 1 15  '$ Difficult doing work/chores  Not difficult at all Not difficult at all Not difficult at all Not difficult at all        Objective:  BP 124/84   Pulse (!) 102   Resp (!) 98   Ht '6\' 2"'$  (1.88 m)   Wt 257 lb 12.8 oz (116.9 kg)   SpO2 97%   BMI 33.10 kg/m   General appearance: alert, no distress, WD/WN, African American male, coughing Skin: some hypopigmentation of face, cheeks, but stable from prior, no other worrisome lesions HEENT: normocephalic, conjunctiva/corneas normal, sclerae anicteric, PERRLA, EOMi Neck: supple, no lymphadenopathy,+ thyromegaly with nodules, no masses, normal ROM, no bruits Oral: dry mucous membranes Chest: non tender, normal shape and expansion Heart: RRR, normal S1, S2, no murmurs Lungs: CTA bilaterally, no wheezes, rhonchi, or rales Abdomen: +bs, soft, non tender, non distended, no masses, no hepatomegaly, no splenomegaly, no bruits Back: non tender, normal ROM, no scoliosis Musculoskeletal: upper extremities non tender, no obvious deformity, normal ROM throughout, lower extremities non tender, no obvious  deformity, normal ROM throughout Extremities: no edema, no cyanosis, no clubbing Pulses: 2+ symmetric, upper and lower extremities, normal cap refill Neurological: alert, oriented x 3, CN2-12 intact, strength normal upper extremities and lower extremities, sensation normal throughout, DTRs 2+ throughout, no cerebellar signs, gait normal Psychiatric: normal affect, behavior normal, pleasant  GU/rectal - deferred/declined   Assessment and Plan :   Encounter Diagnoses  Name Primary?   Routine general medical examination at a health care facility Yes   Needs flu shot    Sore throat    Acute cough    Thyroid nodule    Screening for diabetes mellitus    Screening for lipid  disorders    Family history of prostate cancer in father     This visit was a preventative care visit, also known as wellness visit or routine physical.   Topics typically include healthy lifestyle, diet, exercise, preventative care, vaccinations, sick and well care, proper use of emergency dept and after hours care, as well as other concerns.     Recommendations: Continue to return yearly for your annual wellness and preventative care visits.  This gives [place] a chance to discuss healthy lifestyle, exercise, vaccinations, review your chart record, and perform screenings where appropriate.  I recommend you see your eye doctor yearly for routine vision care.  I recommend you see your dentist yearly for routine dental care including hygiene visits twice yearly.   Vaccination recommendations were reviewed Immunization History  Administered Date(s) Administered   Influenza Whole 04/13/2007   Influenza,inj,Quad PF,6+ Mos 03/22/2013, 07/21/2015, 02/22/2016, 02/23/2019, 03/21/2020, 06/17/2022   PFIZER Comirnaty(Gray Top)Covid-19 Tri-Sucrose Vaccine 10/10/2020, 11/01/2020   Pfizer Covid-19 Vaccine Bivalent Booster 47yr & up 03/09/2021   Tdap 08/11/2014    Counseled on the influenza virus vaccine.  Vaccine information  sheet given.  Influenza vaccine given after consent obtained.    Screening for cancer: Colon cancer screening: Age 36 Testicular cancer screening You should do a monthly self testicular exam if you are between [id number] old  We discussed PSA, prostate exam, and prostate cancer screening risks/benefits.   Age 36 Skin cancer screening: Check your skin regularly for new changes, growing lesions, or other lesions of concern Come in for evaluation if you have skin lesions of concern.  Lung cancer screening: If you have a greater than 20 pack year history of tobacco use, then you may qualify for lung cancer screening with a chest CT scan.   Please call your insurance company to inquire about coverage for this test.  We currently don't have screenings for other cancers besides breast, cervical, colon, and lung cancers.  If you have a strong family history of cancer or have other cancer screening concerns, please let me know.    Bone health: Get at least 150 minutes of aerobic exercise weekly Get weight bearing exercise at least once weekly Bone density test:  A bone density test is an imaging test that uses a type of X-ray to measure the amount of calcium and other minerals in your bones. The test may be used to diagnose or screen you for a condition that causes weak or thin bones (osteoporosis), predict your risk for a broken bone (fracture), or determine how well your osteoporosis treatment is working. The bone density test is recommended for females 641and older, or females or males <<29if certain risk factors such as thyroid disease, long term use of steroids such as for asthma or rheumatological issues, vitamin D deficiency, estrogen deficiency, family history of osteoporosis, self or family history of fragility fracture in first degree relative.    Heart health: Get at least 150 minutes of aerobic exercise weekly Limit alcohol It is important to maintain a healthy blood  pressure and healthy cholesterol numbers  Heart disease screening: Screening for heart disease includes screening for blood pressure, fasting lipids, glucose/diabetes screening, BMI height to weight ratio, reviewed of smoking status, physical activity, and diet.    Goals include blood pressure 120/80 or less, maintaining a healthy lipid/cholesterol profile, preventing diabetes or keeping diabetes numbers under good control, not smoking or using tobacco products, exercising most days per week or at least 150  minutes per week of exercise, and eating healthy variety of fruits and vegetables, healthy oils, and avoiding unhealthy food choices like fried food, fast food, high sugar and high cholesterol foods.    Other tests may possibly include EKG test, CT coronary calcium score, echocardiogram, exercise treadmill stress test.     Medical care options: I recommend you continue to seek care here first for routine care.  We try really hard to have available appointments Monday through Friday daytime hours for sick visits, acute visits, and physicals.  Urgent care should be used for after hours and weekends for significant issues that cannot wait till the next day.  The emergency department should be used for significant potentially life-threatening emergencies.  The emergency department is expensive, can often have long wait times for less significant concerns, so try to utilize primary care, urgent care, or telemedicine when possible to avoid unnecessary trips to the emergency department.  Virtual visits and telemedicine have been introduced since the pandemic started in 2020, and can be convenient ways to receive medical care.  We offer virtual appointments as well to assist you in a variety of options to seek medical care.   Separate significant issues discussed: OSA - continue CPAP.    Thyroid nodules - f/u with endocrinology soon on recent ultrasound results, nodules  BMI > 33 - work on efforts to  lose weight through health diet and exercise  Sore throat, cough - begin medications below, Tussionex, Zpak, continue good water intake, and let me know if not resolved this week     Sherry was seen today for fasting cpe.  Diagnoses and all orders for this visit:  Routine general medical examination at a health care facility -     POCT Urinalysis DIP (Proadvantage Device) -     Comprehensive metabolic panel -     CBC with Differential/Platelet -     Lipid panel -     Hemoglobin A1c  Needs flu shot -     Flu Vaccine QUAD 29moIM (Fluarix, Fluzone & Alfiuria Quad PF)  Sore throat  Acute cough  Thyroid nodule  Screening for diabetes mellitus -     Hemoglobin A1c  Screening for lipid disorders -     Lipid panel  Family history of prostate cancer in father  Other orders -     azithromycin (ZITHROMAX) 250 MG tablet; 2 tablets day 1, then 1 tablet days 2-4 -     chlorpheniramine-HYDROcodone (TUSSIONEX) 10-8 MG/5ML; Take 5 mLs by mouth 2 (two) times daily.    Follow-up pending labs, yearly for physical

## 2022-06-17 NOTE — Patient Instructions (Signed)
This visit was a preventative care visit, also known as wellness visit or routine physical.   Topics typically include healthy lifestyle, diet, exercise, preventative care, vaccinations, sick and well care, proper use of emergency dept and after hours care, as well as other concerns.     Recommendations: Continue to return yearly for your annual wellness and preventative care visits.  This gives [place] a chance to discuss healthy lifestyle, exercise, vaccinations, review your chart record, and perform screenings where appropriate.  I recommend you see your eye doctor yearly for routine vision care.  I recommend you see your dentist yearly for routine dental care including hygiene visits twice yearly.   Vaccination recommendations were reviewed Immunization History  Administered Date(s) Administered   Influenza Whole 04/13/2007   Influenza,inj,Quad PF,6+ Mos 03/22/2013, 07/21/2015, 02/22/2016, 02/23/2019, 03/21/2020, 06/17/2022   PFIZER Comirnaty(Gray Top)Covid-19 Tri-Sucrose Vaccine 10/10/2020, 11/01/2020   Pfizer Covid-19 Vaccine Bivalent Booster 59yr & up 03/09/2021   Tdap 08/11/2014    Counseled on the influenza virus vaccine.  Vaccine information sheet given.  Influenza vaccine given after consent obtained.    Screening for cancer: Colon cancer screening: Age 36 Testicular cancer screening You should do a monthly self testicular exam if you are between [id number] old  We discussed PSA, prostate exam, and prostate cancer screening risks/benefits.   Age 37 Skin cancer screening: Check your skin regularly for new changes, growing lesions, or other lesions of concern Come in for evaluation if you have skin lesions of concern.  Lung cancer screening: If you have a greater than 20 pack year history of tobacco use, then you may qualify for lung cancer screening with a chest CT scan.   Please call your insurance company to inquire about coverage for this test.  We currently  don't have screenings for other cancers besides breast, cervical, colon, and lung cancers.  If you have a strong family history of cancer or have other cancer screening concerns, please let me know.    Bone health: Get at least 150 minutes of aerobic exercise weekly Get weight bearing exercise at least once weekly Bone density test:  A bone density test is an imaging test that uses a type of X-ray to measure the amount of calcium and other minerals in your bones. The test may be used to diagnose or screen you for a condition that causes weak or thin bones (osteoporosis), predict your risk for a broken bone (fracture), or determine how well your osteoporosis treatment is working. The bone density test is recommended for females 665and older, or females or males <<68if certain risk factors such as thyroid disease, long term use of steroids such as for asthma or rheumatological issues, vitamin D deficiency, estrogen deficiency, family history of osteoporosis, self or family history of fragility fracture in first degree relative.    Heart health: Get at least 150 minutes of aerobic exercise weekly Limit alcohol It is important to maintain a healthy blood pressure and healthy cholesterol numbers  Heart disease screening: Screening for heart disease includes screening for blood pressure, fasting lipids, glucose/diabetes screening, BMI height to weight ratio, reviewed of smoking status, physical activity, and diet.    Goals include blood pressure 120/80 or less, maintaining a healthy lipid/cholesterol profile, preventing diabetes or keeping diabetes numbers under good control, not smoking or using tobacco products, exercising most days per week or at least 150 minutes per week of exercise, and eating healthy variety of fruits and vegetables, healthy oils, and  avoiding unhealthy food choices like fried food, fast food, high sugar and high cholesterol foods.    Other tests may possibly include EKG  test, CT coronary calcium score, echocardiogram, exercise treadmill stress test.     Medical care options: I recommend you continue to seek care here first for routine care.  We try really hard to have available appointments Monday through Friday daytime hours for sick visits, acute visits, and physicals.  Urgent care should be used for after hours and weekends for significant issues that cannot wait till the next day.  The emergency department should be used for significant potentially life-threatening emergencies.  The emergency department is expensive, can often have long wait times for less significant concerns, so try to utilize primary care, urgent care, or telemedicine when possible to avoid unnecessary trips to the emergency department.  Virtual visits and telemedicine have been introduced since the pandemic started in 2020, and can be convenient ways to receive medical care.  We offer virtual appointments as well to assist you in a variety of options to seek medical care.   Separate significant issues discussed: OSA - continue CPAP.    Thyroid nodules - f/u with endocrinology soon on recent ultrasound results, nodules  BMI > 33 - work on efforts to lose weight through health diet and exercise  Sore throat, cough - begin medications below, Tussionex, Zpak, continue good water intake, and let me know if not resolved this week

## 2022-06-18 LAB — COMPREHENSIVE METABOLIC PANEL
ALT: 37 IU/L (ref 0–44)
AST: 22 IU/L (ref 0–40)
Albumin/Globulin Ratio: 1.8 (ref 1.2–2.2)
Albumin: 4.9 g/dL (ref 4.1–5.1)
Alkaline Phosphatase: 77 IU/L (ref 44–121)
BUN/Creatinine Ratio: 7 — ABNORMAL LOW (ref 9–20)
BUN: 8 mg/dL (ref 6–20)
Bilirubin Total: 0.4 mg/dL (ref 0.0–1.2)
CO2: 19 mmol/L — ABNORMAL LOW (ref 20–29)
Calcium: 10 mg/dL (ref 8.7–10.2)
Chloride: 101 mmol/L (ref 96–106)
Creatinine, Ser: 1.16 mg/dL (ref 0.76–1.27)
Globulin, Total: 2.7 g/dL (ref 1.5–4.5)
Glucose: 96 mg/dL (ref 70–99)
Potassium: 4.4 mmol/L (ref 3.5–5.2)
Sodium: 140 mmol/L (ref 134–144)
Total Protein: 7.6 g/dL (ref 6.0–8.5)
eGFR: 84 mL/min/{1.73_m2} (ref >59)

## 2022-06-18 LAB — LIPID PANEL
Chol/HDL Ratio: 4.2 ratio (ref 0.0–5.0)
Cholesterol, Total: 215 mg/dL — ABNORMAL HIGH (ref 100–199)
HDL: 51 mg/dL (ref >39)
LDL Chol Calc (NIH): 145 mg/dL — ABNORMAL HIGH (ref 0–99)
Triglycerides: 105 mg/dL (ref 0–149)
VLDL Cholesterol Cal: 19 mg/dL (ref 5–40)

## 2022-06-18 LAB — CBC WITH DIFFERENTIAL/PLATELET
Basophils Absolute: 0 10*3/uL (ref 0.0–0.2)
Basos: 1 %
EOS (ABSOLUTE): 0 10*3/uL (ref 0.0–0.4)
Eos: 1 %
Hematocrit: 49.6 % (ref 37.5–51.0)
Hemoglobin: 16.8 g/dL (ref 13.0–17.7)
Immature Grans (Abs): 0 10*3/uL (ref 0.0–0.1)
Immature Granulocytes: 0 %
Lymphocytes Absolute: 1.9 10*3/uL (ref 0.7–3.1)
Lymphs: 31 %
MCH: 27.3 pg (ref 26.6–33.0)
MCHC: 33.9 g/dL (ref 31.5–35.7)
MCV: 81 fL (ref 79–97)
Monocytes Absolute: 1 10*3/uL — ABNORMAL HIGH (ref 0.1–0.9)
Monocytes: 17 %
Neutrophils Absolute: 3 10*3/uL (ref 1.4–7.0)
Neutrophils: 50 %
Platelets: 306 10*3/uL (ref 150–450)
RBC: 6.16 x10E6/uL — ABNORMAL HIGH (ref 4.14–5.80)
RDW: 13.1 % (ref 11.6–15.4)
WBC: 5.9 10*3/uL (ref 3.4–10.8)

## 2022-06-18 LAB — HEMOGLOBIN A1C
Est. average glucose Bld gHb Est-mCnc: 108 mg/dL
Hgb A1c MFr Bld: 5.4 % (ref 4.8–5.6)

## 2022-06-18 NOTE — Progress Notes (Signed)
Liver kidney and electrolytes normal, diabetes screen normal, cholesterol numbers are too high, blood counts are okay.  I recommend starting medicine to lower the bad cholesterol LDL which helps reduce your risk of heart disease.  If agreeable I will send this to the pharmacy.  And if you start the medicine I would need to see you back in 6 weeks fasting follow-up

## 2022-06-19 ENCOUNTER — Other Ambulatory Visit: Payer: Self-pay | Admitting: Medical

## 2022-06-19 MED ORDER — KETOCONAZOLE 2 % EX SHAM: MEDICATED_SHAMPOO | CUTANEOUS | 2 refills | Status: DC

## 2022-06-19 MED ORDER — ROSUVASTATIN CALCIUM 10 MG PO TABS: 10.0000 mg | ORAL_TABLET | Freq: Every day | ORAL | 0 refills | Status: DC

## 2022-06-20 ENCOUNTER — Encounter (INDEPENDENT_AMBULATORY_CARE_PROVIDER_SITE_OTHER): Payer: Self-pay

## 2022-06-27 ENCOUNTER — Ambulatory Visit: Payer: Medicaid Other | Admitting: Internal Medicine

## 2022-06-27 ENCOUNTER — Encounter: Payer: Self-pay | Admitting: Internal Medicine

## 2022-06-27 VITALS — BP 116/70 | HR 84 | Ht 74.0 in | Wt 252.0 lb

## 2022-06-27 DIAGNOSIS — E042 Nontoxic multinodular goiter: Secondary | ICD-10-CM | POA: Diagnosis not present

## 2022-06-27 NOTE — Progress Notes (Signed)
Name: [name]  MRN/ DOB: [id number], Nov 27, 1985    Age/ Sex: 37 y.o., male    PCP: [name]   Reason for Endocrinology Evaluation: MNG     Date of Initial Endocrinology Evaluation: 06/27/2022     HPI: [name] is a 37 y.o. male with a past medical history of MNG, OSA, Dyslipidemia and ADD. The patient presented for initial endocrinology clinic visit on 06/27/2022 for consultative assistance with his MNG.   Patient has been diagnosed with multinodular goiter based on thyroid ultrasound 05/22/2022, this was triggered based on thyromegaly that was noted on physical exam.   Of note the patient has had normal TFTs in the past  [name] has been stable  He has noted local neck swelling and pain on the left side early December as well as chocking sensation . Has occasional dyshagia   He continues with cough  No XRT  Denies palpitations  Denies tremors  Denies constipation or diarrhea     Denies FH of thyroid disease    HISTORY:  Past Medical History:  Past Medical History:  Diagnosis Date   ADD (attention deficit disorder)    Anxiety    Depression    Gastritis 03/2015   EGD Dr. [name]   GERD (gastroesophageal reflux disease)    H/O echocardiogram 10/2007   normal stress echo, Dr. 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 [name]    Social History:  reports that he has never smoked. He has never used smokeless tobacco. He reports that he does not drink alcohol and does not use drugs. Family History: family history includes Cancer in his paternal grandmother; Cancer (age of onset: 65) in his father; Deep vein thrombosis in his mother; Diabetes in his paternal aunt; Heart disease in his  paternal uncle; Hypertension in his mother; Multiple sclerosis in his father; Prostate cancer in his paternal grandfather.   HOME MEDICATIONS: Allergies as of 06/27/2022       Reactions   Grass Extracts [gramineae Pollens] Swelling        Medication List        Accurate as of June 27, 2022 12:55 PM. If you have any questions, ask your nurse or doctor.          STOP taking these medications    omeprazole 40 MG capsule Commonly known as: PRILOSEC Stopped by: Dorita Sciara, MD       TAKE these medications    albuterol 108 (90 Base) MCG/ACT inhaler Commonly known as: VENTOLIN HFA Inhale 2 puffs into the lungs every 6 (six) hours as needed for wheezing or shortness of breath.   amphetamine-dextroamphetamine 30 MG tablet Commonly known as: Adderall Take 1 tablet by mouth daily.   famotidine 40 MG tablet Commonly known as: PEPCID Take 1 tablet (40 mg total) by mouth at bedtime.   hydrocortisone 2.5 % lotion Apply topically daily as needed.   ketoconazole 2 % shampoo Commonly known as: NIZORAL Apply topically 2 (two) times a week.   rosuvastatin 10 MG tablet Commonly known as: Crestor Take 1 tablet (10 mg total) by mouth daily.   VITAMIN D PO Take 1 tablet by mouth  daily.          REVIEW OF SYSTEMS: A comprehensive ROS was conducted with the patient and is negative except as per HPI and below:  ROS     OBJECTIVE:  VS: BP 116/70 (BP Location: Left Arm, Patient Position: Sitting, Cuff Size: Large)   Pulse 84   Ht 6' 2"$  (1.88 m)   Wt 252 lb (114.3 kg)   SpO2 95%   BMI 32.35 kg/m    Wt Readings from Last 3 Encounters:  06/27/22 252 lb (114.3 kg)  06/17/22 257 lb 12.8 oz (116.9 kg)  05/09/22 253 lb 12.8 oz (115.1 kg)     EXAM: General: Pt appears well and is in NAD  Eyes: External eye exam normal without stare, lid lag or exophthalmos.  EOM intact.  PERRL.  Neck: General: Supple without adenopathy. Thyroid: Thyroid size normal.  No  goiter or nodules appreciated. No thyroid bruit.  Lungs: Clear with good BS bilat with no rales, rhonchi, or wheezes  Heart: Auscultation: RRR.  Abdomen: Normoactive bowel sounds, soft, nontender, without masses or organomegaly palpable  Extremities:  BL LE: No pretibial edema normal ROM and strength.  Mental Status: Judgment, insight: Intact Orientation: Oriented to time, place, and person Mood and affect: No depression, anxiety, or agitation     DATA REVIEWED:    Latest Reference Range & Units 05/09/22 10:59  TSH 0.450 - 4.500 uIU/mL 0.838  T4,Free(Direct) 0.82 - 1.77 ng/dL 1.18    Thyroid ultrasound 05/22/2022    Estimated total number of nodules >/= 1 cm: 3   Number of spongiform nodules >/=  2 cm not described below (TR1): 0   Number of mixed cystic and solid nodules >/= 1.5 cm not described below ([place]): 0   _________________________________________________________   Nodule # 1:   Location: RIGHT; Superior   Maximum size: 3.9 cm; Other 2 dimensions: 2.5 x 2.3 cm   Composition: solid/almost completely solid (2)   Echogenicity: isoechoic (1)   Shape: not taller-than-wide (0)   Margins: ill-defined (0)   Echogenic foci: none (0)   ACR TI-RADS total points: 3.   ACR TI-RADS risk category: TR3 (3 points).   ACR TI-RADS recommendations:   **Given size (>/= 2.5 cm) and appearance, fine needle aspiration of this mildly suspicious nodule should be considered based on TI-RADS criteria.   _________________________________________________________   Nodule # 2:   Location: RIGHT; Mid   Maximum size: 4.6 cm; Other 2 dimensions: 4.5 x 4.0 cm   Composition: solid/almost completely solid (2)   Echogenicity: isoechoic (1)   Shape: not taller-than-wide (0)   Margins: ill-defined (0)   Echogenic foci: none (0)   ACR TI-RADS total points: 3.   ACR TI-RADS risk category: TR3 (3 points).   ACR TI-RADS recommendations:   **Given size (>/= 2.5 cm) and  appearance, fine needle aspiration of this mildly suspicious nodule should be considered based on TI-RADS criteria.   _________________________________________________________   Nodule # 3:   Location: LEFT; Mid   Maximum size: 2.6 cm; Other 2 dimensions: 1.0 x 1.4 cm   Composition: solid/almost completely solid (2)   Echogenicity: hypoechoic (2)   Shape: not taller-than-wide (0)   Margins: ill-defined (0)   Echogenic foci: none (0)   ACR TI-RADS total points: 4.   ACR TI-RADS risk category: TR4 (4-6 points).   ACR TI-RADS recommendations:   **Given size (>/= 1.5 cm) and appearance, fine needle aspiration of this moderately suspicious nodule should be considered based on  TI-RADS criteria.   _________________________________________________________   No cervical adenopathy or abnormal fluid collection within the imaged neck.   IMPRESSION: 1. Enlarged multinodular thyroid, consistent with a goiter. 2. 3.9 cm RIGHT superior TR-3, 4.6 cm RIGHT mid TR-3 and 2.6 cm LEFT mid TR-4 thyroid nodules. FNA biopsy of these suspicious nodules should be considered based on TI-RADS criteria. 3. For feasibility, consider biopsy of nodules #2 (4.6 cm RIGHT mid TR-3) and #3 (2.6 cm LEFT mid TR-4) since only 2 nodules can be biopsied at a time.   The above is in keeping with the ACR TI-RADS recommendations - J Am Coll Radiol 2017;14:587-595.   Old records , labs and images have been reviewed.   ASSESSMENT/PLAN/RECOMMENDATIONS:   Multinodular goiter:   -Patient is clinically and biochemically euthyroid -He does have local neck symptoms and is interested in surgical intervention.  He does understand that he will require LT-for replacement for life after total thyroidectomy -We discussed proceeding with FNA of the right and left nodules, once these are done we will proceed with FNA of the thyroid nodule (at the biopsy 2 nodules at the time) -Patient expressed understanding and all  questions were answered  Follow-up in 6 months Signed electronically by: Mack Guise, MD  [facility] Endocrin[facility]  [facility] [address] [place], [place] [address] Phone: [phone] FAX: [phone]   CC: Carlena Hurl, PA-C [place] [address] Phone: [phone] Fax: [phone]   Return to Endocrinology clinic as below: Future Appointments  Date Time Provider [place]  06/27/2022  1:00 PM Bohdi Leeds, Melanie Crazier, MD [facility] None  08/08/2022  1:30 PM Debbora Presto, NP [facility] None  06/19/2023  2:30 PM Tysinger, Camelia Eng, PA-C [facility]

## 2022-06-28 ENCOUNTER — Encounter: Payer: Self-pay | Admitting: General Practice

## 2022-06-28 NOTE — Progress Notes (Signed)
Jason Daft, MD  Allen Kell, NT Ok to schedule for thyroid FNA  Anselm Pancoast       Previous Messages    ----- Message ----- From: Allen Kell, NT Sent: 06/27/2022   2:30 PM EST To: Ir Procedure Requests Subject: [place] FNA x 2                                    Procedure: [place] FNA BIOPSY THYROID EA ADD LESION AFIRMA / [place] FNA [place]  Reason: Please FNA nodule #2 , right mid 4.6 cm nodule, Please FNA nodule #3 Left mid 2.6 cm  History: US Thyroid in chart  Provider: Cloyd Stagers, MD  Contact: [phone]  Thyroid Impression: IMPRESSION: 1. Enlarged multinodular thyroid, consistent with a goiter. 2. 3.9 cm RIGHT superior TR-3, 4.6 cm RIGHT mid TR-3 and 2.6 cm LEFT mid TR-4 thyroid nodules. FNA biopsy of these suspicious nodules should be considered based on TI-RADS criteria. 3. For feasibility, consider biopsy of nodules #2 (4.6 cm RIGHT mid TR-3) and #3 (2.6 cm LEFT mid TR-4) since only 2 nodules can be biopsied at a time.   The above is in keeping with the ACR TI-RADS recommendations - J Am Coll Radiol 2017;14:587-595.   If both needed can I schedule both at same time?

## 2022-07-01 ENCOUNTER — Encounter: Payer: Self-pay | Admitting: General Practice

## 2022-07-01 NOTE — Progress Notes (Signed)
Markus Daft, MD  Allen Kell, NT Schedule for FNA of 2 thyroid nodules.  [place]       Previous Messages    ----- Message ----- From: Allen Kell, NT Sent: 06/28/2022  11:14 AM EST To: Markus Daft, MD Subject: RE: [place] FNA x 2                                Just want to clarify can I schedule both the FNA's MD listed or just a certain one you want scheduled? ----- Message ----- From: Markus Daft, MD Sent: 06/27/2022   7:01 PM EST To: Allen Kell, NT Subject: RE: [place] FNA x 2                                Ok to schedule for thyroid FNA  Henn ----- Message ----- From: Allen Kell, NT Sent: 06/27/2022   2:30 PM EST To: Ir Procedure Requests Subject: [place] FNA x 2                                    Procedure: [place] FNA BIOPSY THYROID EA ADD LESION AFIRMA / [place] FNA [place]  Reason: Please FNA nodule #2 , right mid 4.6 cm nodule, Please FNA nodule #3 Left mid 2.6 cm  History: US Thyroid in chart  Provider: Cloyd Stagers, MD  Contact: [phone]  Thyroid Impression: IMPRESSION: 1. Enlarged multinodular thyroid, consistent with a goiter. 2. 3.9 cm RIGHT superior TR-3, 4.6 cm RIGHT mid TR-3 and 2.6 cm LEFT mid TR-4 thyroid nodules. FNA biopsy of these suspicious nodules should be considered based on TI-RADS criteria. 3. For feasibility, consider biopsy of nodules #2 (4.6 cm RIGHT mid TR-3) and #3 (2.6 cm LEFT mid TR-4) since only 2 nodules can be biopsied at a time.   The above is in keeping with the ACR TI-RADS recommendations - J Am Coll Radiol 2017;14:587-595.   If both needed can I schedule both at same time?

## 2022-07-16 ENCOUNTER — Ambulatory Visit (HOSPITAL_COMMUNITY): Payer: Medicaid Other

## 2022-07-16 ENCOUNTER — Ambulatory Visit (HOSPITAL_COMMUNITY)
Admission: RE | Admit: 2022-07-16 | Discharge: 2022-07-16 | Disposition: A | Discharge status: Home / Self Care | Payer: Medicaid Other | Source: Ambulatory Visit | Attending: Internal Medicine | Admitting: Internal Medicine

## 2022-07-16 DIAGNOSIS — R888 Abnormal findings in other body fluids and substances: Secondary | ICD-10-CM | POA: Insufficient documentation

## 2022-07-16 DIAGNOSIS — E041 Nontoxic single thyroid nodule: Secondary | ICD-10-CM | POA: Diagnosis present

## 2022-07-16 DIAGNOSIS — E042 Nontoxic multinodular goiter: Secondary | ICD-10-CM

## 2022-07-16 MED ORDER — LIDOCAINE HCL (PF) 1 % IJ SOLN
15.0000 mL | Freq: Once | INTRAMUSCULAR | Status: AC
  Administered 2022-07-16: 15 mL via INTRADERMAL

## 2022-07-18 LAB — CYTOLOGY - NON PAP

## 2022-07-30 ENCOUNTER — Encounter (HOSPITAL_COMMUNITY): Payer: Self-pay

## 2022-08-06 NOTE — Patient Instructions (Incomplete)
Please continue using your CPAP regularly. While your insurance requires that you use CPAP at least 4 hours each night on 70% of the nights, I recommend, that you not skip any nights and use it throughout the night if you can. Getting used to CPAP and staying with the treatment long term does take time and patience and discipline. Untreated obstructive sleep apnea when it is moderate to severe can have an adverse impact on cardiovascular health and raise her risk for heart disease, arrhythmias, hypertension, congestive heart failure, stroke and diabetes. Untreated obstructive sleep apnea causes sleep disruption, nonrestorative sleep, and sleep deprivation. This can have an impact on your day to day functioning and cause daytime sleepiness and impairment of cognitive function, memory loss, mood disturbance, and problems focussing. Using CPAP regularly can improve these symptoms.  We will update supply orders, today.   Follow up in 6 months

## 2022-08-06 NOTE — Progress Notes (Signed)
PATIENT: [name] DOB: 01-16-1986  REASON FOR VISIT: follow up HISTORY FROM: patient  Chief Complaint  Patient presents with   Follow-up    Pt in room 16 here for cpap follow up. Pt said he hasn't been using cpap regularly,due to cough, pt had thyroid biopsy done, will need to surgery.     HISTORY OF PRESENT ILLNESS:  08/08/22 ALL:  [name] returns for follow up for OSA on CPAP. He was last seen 01/2022 and having difficulty meeting 4 hour compliance. Daily compliance was at 77%. Since, he reports having more difficulty with trouble swallowing and a cough. He was diagnosed with a thyroid goiter felt to be causing compressive symptoms. He is planning to have a total thyroidectomy soon. He feels that CPAP has made coughing worse. He continues to try to use it from time to time.     02/06/2022 ALL: [name] returns for follow up for OSA on CPAP. He was last seen 07/2021 and had stopped therapy following a nose bleed. He has resumed CPAP. He admits that he may use CPAP for the first few hours of sleep then take his mask off. He denies difficulty tolerating mask. He does endorse feeling less sleepy when he uses CPAP.     08/06/2021 ALL: [name] returns for follow up for OSA on CPAP. He continues to work on meeting compliance with therapy. He has not used CPAP since 07/18/2021 due to having a nose bleed. He was afraid CPAP was contributing and has been hesitant to restart. No further episodes of nasal bleeding. He was seen by PCP. He does report concerns of seasonal allergies.       03/20/2021 ALL:  [name] is a 37 y.o. male here today for follow up for OSA on CPAP. He was seen by Dr Dohmeier in 05/2020 for difficulty initiating and maintaining sleep. HST 06/2020 showed severe OSA with total AHI of 31.9/hr. AutoPAP ordered and set up 12/2020. Since, he is doing well. He is adjusting to CPAP therapy. He admits that he doesn't always use therapy but does report feeling better  when he does.     HISTORY: (copied from Dr Dohmeier's previous note)  [name] is a 37 y.o. year old  African- American male patient of Dr Richrd Humbles and referred for a sleep consultation by PA Tysinger. seen  on 06/27/2020. Chief concern according to patient : " I am not able to sleep, I am always looking around, and easily woken up- I have a hard time with concentration , easily distracted. I am falling asleep when the sun rises and sleep into the afternoon".   [name] is a left-handed [place] American male with chronic insomnia, delayed sleep phase syndrome, sleeping in daytime . He  has a past medical history of ADHD (attention deficit disorder with hyperactivity), Anxiety, Depression, Gastritis (03/2015), Learning disability, Obesity,  Insomnia,  Psychotic disorder with hallucinations (HCC), Tinea corporis, and Wears glasses.   The patient had the first sleep study in the year 2017  with no sleep time recorded. Advise was given to addre delayed sleep phase syndrome and address with psychology.    Sleep relevant medical history:  No ENT surgery, no Tonsillectomy.   Family medical /sleep history: no other family member on CPAP with OSA, insomnia, not aware of any psychiatric illness.  Social history:  Patient is working as a Buyer, retail, Physiological scientist- looking for work. Unemployed since 04-2018. He lives in a household  alone.  Tobacco use- never .  ETOH use ; none , Caffeine intake in form of Soda( 1 a day)  Regular exercise ; weight lifting. Hobbies :cooking.    Sleep habits are as follows: The patient's dinner time is between 11 PM. The patient takes a hot shower, goes to bed at 12 PM and is awake - for hours, once asleep he  continues to sleep for 5-6  hours, wakes rarely for  bathroom breaks.  He has racing thoughts, incoherent thoughts. Watches T in bed, or reads.  The preferred sleep position is supine , with the support of 1 pillow.  Dreams are  reportedly rare.  3-4  PM is the usual rise time. The patient wakes up spontaneously.  He reports not feeling refreshed or restored from sleep, with symptoms such as dry mouth, morning headaches, and residual fatigue.  Naps are taken infrequent.    REVIEW OF SYSTEMS: Out of a complete 14 system review of symptoms, the patient complains only of the following symptoms, nose bleed, seasonal allergies  and all other reviewed systems are negative.  ESS: 5/24  ALLERGIES: Allergies  Allergen Reactions   Grass Extracts [Gramineae Pollens] Swelling    HOME MEDICATIONS: Outpatient Medications Prior to Visit  Medication Sig Dispense Refill   amphetamine-dextroamphetamine (ADDERALL) 30 MG tablet Take 1 tablet by mouth daily. 60 tablet 0   famotidine (PEPCID) 40 MG tablet Take 1 tablet (40 mg total) by mouth at bedtime. 90 tablet 1   hydrocortisone 2.5 % lotion Apply topically daily as needed.     ketoconazole (NIZORAL) 2 % shampoo Apply topically 2 (two) times a week. 120 mL 2   rosuvastatin (CRESTOR) 10 MG tablet Take 1 tablet (10 mg total) by mouth daily. 90 tablet 0   VITAMIN D PO Take 1 tablet by mouth daily.     albuterol (VENTOLIN HFA) 108 (90 Base) MCG/ACT inhaler Inhale 2 puffs into the lungs every 6 (six) hours as needed for wheezing or shortness of breath. (Patient not taking: Reported on 08/08/2022) 8 g 0   No facility-administered medications prior to visit.    PAST MEDICAL HISTORY: Past Medical History:  Diagnosis Date   ADD (attention deficit disorder)    Anxiety    Depression    Gastritis 03/2015   EGD Dr. Karolee Ohs   GERD (gastroesophageal reflux disease)    H/O echocardiogram 10/2007   normal stress echo, Dr. Charlton Haws   Learning disability    Obesity    Psychotic disorder with hallucinations ([facility]CC)    Sleep apnea    Tinea corporis    Wears glasses     PAST SURGICAL HISTORY: Past Surgical History:  Procedure Laterality Date   ESOPHAGOGASTRODUODENOSCOPY   03/2015   gastritis; Dr. Karolee Ohs    FAMILY HISTORY: Family History  Problem Relation Age of Onset   Hypertension Mother    Deep vein thrombosis Mother    Cancer Father 45       father   Multiple sclerosis Father    Cancer Paternal Grandmother        uterine   Prostate cancer Paternal Grandfather    Diabetes Paternal Aunt    Heart disease Paternal Uncle    Stroke Neg Hx    Colon cancer Neg Hx    Stomach cancer Neg Hx    Esophageal cancer Neg Hx     SOCIAL HISTORY: Social History   Socioeconomic History   Marital status: Single    Spouse name:  Not on file   Number of children: 0   Years of education: Not on file   Highest education level: Associate degree: academic program  Occupational History    Comment: Engineer, maintenance (IT) arts  Tobacco Use   Smoking status: Never   Smokeless tobacco: Never  Vaping Use   Vaping Use: Never used  Substance and Sexual Activity   Alcohol use: No   Drug use: No   Sexual activity: Yes    Partners: Female  Other Topics Concern   Not on file  Social History Narrative   Lives with father.   Has degree from [employer] for [employer], does modeling and acting on the side.   Exercise- some weights, walking.  05/2021   Social Determinants of Health   Financial Resource Strain: Not on file  Food Insecurity: Not on file  Transportation Needs: Not on file  Physical Activity: Not on file  Stress: Not on file  Social Connections: Not on file  Intimate Partner Violence: Not on file     PHYSICAL EXAM  Vitals:   08/08/22 1321  BP: 128/82  Pulse: 66  Weight: 259 lb (117.5 kg)  Height: 6\' 2"  (1.88 m)      Body mass index is 33.25 kg/m.  Generalized: Well developed, in no acute distress  Cardiology: normal rate and rhythm, no murmur noted Respiratory: clear to auscultation bilaterally  Neurological examination  Mentation: Alert oriented to time, place, history taking. Follows all commands speech and language fluent Cranial nerve  II-XII: Pupils were equal round reactive to light. Extraocular movements were full, visual field were full  Motor: The motor testing reveals 5 over 5 strength of all 4 extremities. Good symmetric motor tone is noted throughout.  Gait and station: Gait is normal.    DIAGNOSTIC DATA (LABS, IMAGING, TESTING) - I reviewed patient records, labs, notes, testing and imaging myself where available.      No data to display           Lab Results  Component Value Date   WBC 5.9 06/17/2022   HGB 16.8 06/17/2022   HCT 49.6 06/17/2022   MCV 81 06/17/2022   PLT 306 06/17/2022      Component Value Date/Time   NA 140 06/17/2022 1415   K 4.4 06/17/2022 1415   CL 101 06/17/2022 1415   CO2 19 (L) 06/17/2022 1415   GLUCOSE 96 06/17/2022 1415   GLUCOSE 96 07/14/2019 0034   BUN 8 06/17/2022 1415   CREATININE 1.16 06/17/2022 1415   CREATININE 1.03 07/21/2015 0001   CALCIUM 10.0 06/17/2022 1415   PROT 7.6 06/17/2022 1415   ALBUMIN 4.9 06/17/2022 1415   AST 22 06/17/2022 1415   ALT 37 06/17/2022 1415   ALKPHOS 77 06/17/2022 1415   BILITOT 0.4 06/17/2022 1415   GFRNONAA 77 05/08/2020 0900   GFRAA 89 05/08/2020 0900   Lab Results  Component Value Date   CHOL 215 (H) 06/17/2022   HDL 51 06/17/2022   LDLCALC 145 (H) 06/17/2022   TRIG 105 06/17/2022   CHOLHDL 4.2 06/17/2022   Lab Results  Component Value Date   HGBA1C 5.4 06/17/2022   No results found for: "VITAMINB12" Lab Results  Component Value Date   TSH 0.838 05/09/2022     ASSESSMENT AND PLAN 37 y.o. year old male  has a past medical history of ADD (attention deficit disorder), Anxiety, Depression, Gastritis (03/2015), GERD (gastroesophageal reflux disease), H/O echocardiogram (10/2007), Learning disability, Obesity, Psychotic disorder with hallucinations ([facility]CC), Sleep  apnea, Tinea corporis, and Wears glasses. here with     ICD-10-CM   1. OSA on CPAP  G47.33 For home use only DME continuous positive airway pressure (CPAP)       [name] is doing fairly well on CPAP. He does report feeling better when he uses CPAP. Compliance report for past 30 days reveals sub optimal usage. He feels he will be able to tolerate it better following thyroid surgery. He was encouraged to continue using CPAP nightly and for greater than 4 hours each night. We will update supply orders as indicated. Risks of untreated sleep apnea review and education materials provided. Healthy lifestyle habits encouraged. He will follow up in 6 months, sooner if needed. He verbalizes understanding and agreement with this plan.    Orders Placed This Encounter  Procedures   For home use only DME continuous positive airway pressure (CPAP)    Supplies    Order Specific Question:   Length of Need    Answer:   Lifetime    Order Specific Question:   Patient has OSA or probable OSA    Answer:   Yes    Order Specific Question:   Is the patient currently using CPAP in the home    Answer:   Yes    Order Specific Question:   Settings    Answer:   Other see comments    Order Specific Question:   CPAP supplies needed    Answer:   Mask, headgear, cushions, filters, heated tubing and water chamber      No orders of the defined types were placed in this encounter.      Shawnie Dapper, FNP-C 08/08/2022, 1:51 PM [facility] [address] [place], [place] [address] [phone]

## 2022-08-07 ENCOUNTER — Ambulatory Visit: Payer: Self-pay | Admitting: General Surgery

## 2022-08-07 ENCOUNTER — Other Ambulatory Visit: Payer: Self-pay | Admitting: Medical

## 2022-08-07 ENCOUNTER — Telehealth: Payer: Self-pay

## 2022-08-07 DIAGNOSIS — K219 Gastro-esophageal reflux disease without esophagitis: Secondary | ICD-10-CM

## 2022-08-07 MED ORDER — FAMOTIDINE 40 MG PO TABS: 40.0000 mg | ORAL_TABLET | Freq: Every day | ORAL | 1 refills | Status: DC

## 2022-08-07 MED ORDER — AMPHETAMINE-DEXTROAMPHETAMINE 30 MG PO TABS: 30.0000 mg | ORAL_TABLET | Freq: Every day | ORAL | 0 refills | Status: DC

## 2022-08-07 NOTE — H&P (Signed)
Chief Complaint: New Consultation ( thyroid,)   History of Present Illness: [name] is a 37 y.o. male who is seen today as an office consultation for evaluation of New Consultation ( thyroid,)     Patient is a 37 year old male comes in secondary to thyroid nodules. Patient states that over the last 2 to 3 months he has noted some suffocation type symptoms, dysphagia.  He underwent ultrasound.  This did show thyroid nodules.  These were biopsied and Affirma was sent.  Afirma did show 75% chance of malignancy.   Discussed with the patient he has had some pressure and suffocation type symptoms when sleeping at night.  He also feels that he has some dysphagia with foods.  He states that he had some coughing attacks which he attributes to the thyroid.        Review of Systems: A complete review of systems was obtained from the patient.  I have reviewed this information and discussed as appropriate with the patient.  See HPI as well for other ROS.   Review of Systems  Constitutional:  Negative for fever.  HENT:  Negative for congestion.   Eyes:  Negative for blurred vision.  Respiratory:  Negative for cough, shortness of breath and wheezing.   Cardiovascular:  Negative for chest pain and palpitations.  Gastrointestinal:  Negative for heartburn.  Genitourinary:  Negative for dysuria.  Musculoskeletal:  Negative for myalgias.  Skin:  Negative for rash.  Neurological:  Negative for dizziness and headaches.  Psychiatric/Behavioral:  Negative for depression and suicidal ideas.   All other systems reviewed and are negative.     Medical History: Past Medical History Past Medical History: Diagnosis Date  Sleep apnea        There is no problem list on file for this patient.     Past Surgical History History reviewed. No pertinent surgical history.     Allergies No Known Allergies    Current Outpatient Medications on File Prior to  Visit Medication Sig Dispense Refill  dextroamphetamine-amphetamine (ADDERALL) 30 mg tablet Take 1 tablet by mouth once daily      famotidine (PEPCID) 40 MG tablet Take 40 mg by mouth at bedtime      omeprazole (PRILOSEC) 40 MG DR capsule         No current facility-administered medications on file prior to visit.     Family History History reviewed. No pertinent family history.     Social History   Tobacco Use Smoking Status Never Smokeless Tobacco Never     Social History Social History    Socioeconomic History  Marital status: Single Tobacco Use  Smoking status: Never  Smokeless tobacco: Never Substance and Sexual Activity  Alcohol use: Not Currently  Drug use: Never      Objective:   Vitals:   08/07/22 1448 BP: 116/71 Pulse: 95 Temp: 36.7 C (98 F) SpO2: 99% Weight: (!) 117.5 kg (259 lb) Height: 188 cm (6\' 2" )   Body mass index is 33.25 kg/m.   Physical Exam Constitutional:      General: He is not in acute distress.    Appearance: Normal appearance.  HENT:     Head: Normocephalic.     Nose: No rhinorrhea.     Mouth/Throat:     Mouth: Mucous membranes are moist.     Pharynx: Oropharynx is clear.  Eyes:     General: No scleral icterus.    Pupils: Pupils are equal, round, and reactive to light.  Cardiovascular:  Rate and Rhythm: Normal rate.     Pulses: Normal pulses.  Pulmonary:     Effort: Pulmonary effort is normal. No respiratory distress.     Breath sounds: No stridor. No wheezing.  Abdominal:     General: Abdomen is flat. There is no distension.     Tenderness: There is no abdominal tenderness. There is no guarding or rebound.  Musculoskeletal:        General: Normal range of motion.     Cervical back: Normal range of motion and neck supple.  Skin:    General: Skin is warm and dry.     Capillary Refill: Capillary refill takes less than 2 seconds.     Coloration: Skin is not jaundiced.  Neurological:     General: No focal  deficit present.     Mental Status: He is alert and oriented to person, place, and time. Mental status is at baseline.  Psychiatric:        Mood and Affect: Mood normal.        Thought Content: Thought content normal.        Judgment: Judgment normal.      THYROID ULTRASOUND   TECHNIQUE: Ultrasound examination of the thyroid gland and adjacent soft tissues was performed.   COMPARISON:  CT neck, 10/25/2003.   FINDINGS: Parenchymal Echotexture: Moderately heterogenous   Isthmus: 0.6 cm   Right lobe: 10.3 x 3.9 x 7.0 cm   Left lobe: 5.8 x 1.3 x 1.3 cm   _________________________________________________________   Estimated total number of nodules >/= 1 cm: 3   Number of spongiform nodules >/=  2 cm not described below (TR1): 0   Number of mixed cystic and solid nodules >/= 1.5 cm not described below (TR2): 0   _________________________________________________________   Nodule # 1:   Location: RIGHT; Superior   Maximum size: 3.9 cm; Other 2 dimensions: 2.5 x 2.3 cm   Composition: solid/almost completely solid (2)   Echogenicity: isoechoic (1)   Shape: not taller-than-wide (0)   Margins: ill-defined (0)   Echogenic foci: none (0)   ACR TI-RADS total points: 3.   ACR TI-RADS risk category: TR3 (3 points).   ACR TI-RADS recommendations:   **Given size (>/= 2.5 cm) and appearance, fine needle aspiration of this mildly suspicious nodule should be considered based on TI-RADS criteria.   _________________________________________________________   Nodule # 2:   Location: RIGHT; Mid   Maximum size: 4.6 cm; Other 2 dimensions: 4.5 x 4.0 cm   Composition: solid/almost completely solid (2)   Echogenicity: isoechoic (1)   Shape: not taller-than-wide (0)   Margins: ill-defined (0)   Echogenic foci: none (0)   ACR TI-RADS total points: 3.   ACR TI-RADS risk category: TR3 (3 points).   ACR TI-RADS recommendations:   **Given size (>/= 2.5 cm) and  appearance, fine needle aspiration of this mildly suspicious nodule should be considered based on TI-RADS criteria.   _________________________________________________________   Nodule # 3:   Location: LEFT; Mid   Maximum size: 2.6 cm; Other 2 dimensions: 1.0 x 1.4 cm   Composition: solid/almost completely solid (2)   Echogenicity: hypoechoic (2)   Shape: not taller-than-wide (0)   Margins: ill-defined (0)   Echogenic foci: none (0)   ACR TI-RADS total points: 4.   ACR TI-RADS risk category: TR4 (4-6 points).   ACR TI-RADS recommendations:   **Given size (>/= 1.5 cm) and appearance, fine needle aspiration of this moderately suspicious nodule should be considered based  on TI-RADS criteria.   _________________________________________________________   No cervical adenopathy or abnormal fluid collection within the imaged neck.   IMPRESSION: 1. Enlarged multinodular thyroid, consistent with a goiter. 2. 3.9 cm RIGHT superior TR-3, 4.6 cm RIGHT mid TR-3 and 2.6 cm LEFT mid TR-4 thyroid nodules. FNA biopsy of these suspicious nodules should be considered based on TI-RADS criteria. 3. For feasibility, consider biopsy of nodules #2 (4.6 cm RIGHT mid TR-3) and #3 (2.6 cm LEFT mid TR-4) since only 2 nodules can be biopsied at a time.       Assessment and Plan:    Diagnoses and all orders for this visit:   Thyroid nodule     37 year old male with thyroid nodule, thyromegaly and compressive symptoms.  patient has enough from her that says 75% chance of malignancy.  I believe is coupled together with his symptoms he would benefit from total thyroidectomy. 1.  Will proceed to the operating for total thyroidectomy.  I discussed with him the risk and benefits of the procedure to include but not limited to: Infection, bleeding, damage surrounding structures, possible need for further surgery or tracheostomy.  Patient voiced understanding wished to proceed. 2.  I did discuss  with him that he would require lifelong thyroid replacement hormone.   Ralene Ok, MD

## 2022-08-07 NOTE — Telephone Encounter (Signed)
Pt called advising he is out of pepcid (last sent 11/08/21 30 tabs with 11 refills) and adderall (last sent on 12/01/20 60 tabs with 0 refills) . Requesting a refill.

## 2022-08-08 ENCOUNTER — Encounter: Payer: Self-pay | Admitting: Family Medicine

## 2022-08-08 ENCOUNTER — Ambulatory Visit (INDEPENDENT_AMBULATORY_CARE_PROVIDER_SITE_OTHER): Payer: Medicaid Other | Admitting: Family Medicine

## 2022-08-08 VITALS — BP 128/82 | HR 66 | Ht 74.0 in | Wt 259.0 lb

## 2022-08-08 DIAGNOSIS — G4733 Obstructive sleep apnea (adult) (pediatric): Secondary | ICD-10-CM | POA: Diagnosis not present

## 2022-09-03 NOTE — Pre-Procedure Instructions (Signed)
Surgical Instructions    Your procedure is scheduled on September 12, 2022.  Report to [facility] Main Entrance "A" at 7:00 A.M., then check in with the Admitting office.  Call this number if you have problems the morning of surgery:  [phone]  If you have any questions prior to your surgery date call [phone]: Open Monday-Friday 8am-4pm If you experience any cold or flu symptoms such as cough, fever, chills, shortness of breath, etc. between now and your scheduled surgery, please notify us at the above number.     Remember:  Do not eat after midnight the night before your surgery  You may drink clear liquids until 6:00 AM the morning of your surgery.   Clear liquids allowed are: Water, Non-Citrus Juices (without pulp), Carbonated Beverages, Clear Tea, Black Coffee Only (NO MILK, CREAM OR POWDERED CREAMER of any kind), and Gatorade.  Patient Instructions  The night before surgery:  No food after midnight. ONLY clear liquids after midnight  The day of surgery (if you do NOT have diabetes):  Drink ONE (1) Pre-Surgery Clear Ensure by 6:00 AM the morning of surgery. Drink in one sitting. Do not sip.  This drink was given to you during your hospital  pre-op appointment visit.  Nothing else to drink after completing the  Pre-Surgery Clear Ensure.         If you have questions, please contact your surgeon's office.     Take these medicines the morning of surgery with A SIP OF WATER:  omeprazole (PRILOSEC)   rosuvastatin (CRESTOR)    As of today, STOP taking any Aspirin (unless otherwise instructed by your surgeon) Aleve, Naproxen, Ibuprofen, Motrin, Advil, Goody's, BC's, all herbal medications, fish oil, and all vitamins.                     Do NOT Smoke (Tobacco/Vaping) for 24 hours prior to your procedure.  If you use a CPAP at night, you may bring your mask/headgear for your overnight stay.   Contacts, glasses, piercing's, hearing aid's, dentures or partials may not be  worn into surgery, please bring cases for these belongings.    For patients admitted to the hospital, discharge time will be determined by your treatment team.   Patients discharged the day of surgery will not be allowed to drive home, and someone needs to stay with them for 24 hours.  SURGICAL WAITING ROOM VISITATION Patients having surgery or a procedure may have no more than 2 support people in the waiting area - these visitors may rotate.   Children under the age of 20 must have an adult with them who is not the patient. If the patient needs to stay at the hospital during part of their recovery, the visitor guidelines for inpatient rooms apply. Pre-op nurse will coordinate an appropriate time for 1 support person to accompany patient in pre-op.  This support person may not rotate.   Please refer to the [facility] website for the visitor guidelines for Inpatients (after your surgery is over and you are in a regular room).    Special instructions:   [place]- Preparing For Surgery  Before surgery, you can play an important role. Because skin is not sterile, your skin needs to be as free of germs as possible. You can reduce the number of germs on your skin by washing with CHG (chlorahexidine gluconate) Soap before surgery.  CHG is an antiseptic cleaner which kills germs and bonds with the skin to continue  killing germs even after washing.    Oral Hygiene is also important to reduce your risk of infection.  Remember - BRUSH YOUR TEETH THE MORNING OF SURGERY WITH YOUR REGULAR TOOTHPASTE  Please do not use if you have an allergy to CHG or antibacterial soaps. If your skin becomes reddened/irritated stop using the CHG.  Do not shave (including legs and underarms) for at least 48 hours prior to first CHG shower. It is OK to shave your face.  Please follow these instructions carefully.   Shower the NIGHT BEFORE SURGERY and the MORNING OF SURGERY  If you chose to wash your hair, wash your  hair first as usual with your normal shampoo.  After you shampoo, rinse your hair and body thoroughly to remove the shampoo.  Use CHG Soap as you would any other liquid soap. You can apply CHG directly to the skin and wash gently with a scrungie or a clean washcloth.   Apply the CHG Soap to your body ONLY FROM THE NECK DOWN.  Do not use on open wounds or open sores. Avoid contact with your eyes, ears, mouth and genitals (private parts). Wash Face and genitals (private parts)  with your normal soap.   Wash thoroughly, paying special attention to the area where your surgery will be performed.  Thoroughly rinse your body with warm water from the neck down.  DO NOT shower/wash with your normal soap after using and rinsing off the CHG Soap.  Pat yourself dry with a CLEAN TOWEL.  Wear CLEAN PAJAMAS to bed the night before surgery  Place CLEAN SHEETS on your bed the night before your surgery  DO NOT SLEEP WITH PETS.   Day of Surgery: Take a shower with CHG soap. Do not wear jewelry or makeup Do not wear lotions, powders, perfumes/colognes, or deodorant. Do not shave 48 hours prior to surgery.  Men may shave face and neck. Do not bring valuables to the hospital.  [facility] is not responsible for any belongings or valuables. Do not wear nail polish, gel polish, artificial nails, or any other type of covering on natural nails (fingers and toes) If you have artificial nails or gel coating that need to be removed by a nail salon, please have this removed prior to surgery. Artificial nails or gel coating may interfere with anesthesia's ability to adequately monitor your vital signs.  Wear Clean/Comfortable clothing the morning of surgery Remember to brush your teeth WITH YOUR REGULAR TOOTHPASTE.   Please read over the following fact sheets that you were given.    If you received a COVID test during your pre-op visit  it is requested that you wear a mask when out in public, stay away from  anyone that may not be feeling well and notify your surgeon if you develop symptoms. If you have been in contact with anyone that has tested positive in the last 10 days please notify you surgeon.

## 2022-09-04 ENCOUNTER — Encounter (HOSPITAL_COMMUNITY)
Admission: RE | Admit: 2022-09-04 | Discharge: 2022-09-04 | Disposition: A | Discharge status: Home / Self Care | Payer: Medicaid Other | Source: Ambulatory Visit | Attending: General Surgery | Admitting: General Surgery

## 2022-09-04 ENCOUNTER — Encounter (HOSPITAL_COMMUNITY): Payer: Self-pay

## 2022-09-04 ENCOUNTER — Other Ambulatory Visit: Payer: Self-pay

## 2022-09-04 VITALS — BP 121/79 | HR 62 | Temp 97.5°F | Resp 19 | Ht 75.0 in | Wt 253.0 lb

## 2022-09-04 DIAGNOSIS — Z01812 Encounter for preprocedural laboratory examination: Secondary | ICD-10-CM | POA: Insufficient documentation

## 2022-09-04 DIAGNOSIS — Z01818 Encounter for other preprocedural examination: Secondary | ICD-10-CM

## 2022-09-04 HISTORY — DX: Nontoxic single thyroid nodule: E04.1

## 2022-09-04 LAB — CBC
HCT: 50.7 % (ref 39.0–52.0)
Hemoglobin: 16.3 g/dL (ref 13.0–17.0)
MCH: 27.5 pg (ref 26.0–34.0)
MCHC: 32.1 g/dL (ref 30.0–36.0)
MCV: 85.6 fL (ref 80.0–100.0)
Platelets: 320 10*3/uL (ref 150–400)
RBC: 5.92 MIL/uL — ABNORMAL HIGH (ref 4.22–5.81)
RDW: 13.8 % (ref 11.5–15.5)
WBC: 5.8 10*3/uL (ref 4.0–10.5)
nRBC: 0 % (ref 0.0–0.2)

## 2022-09-04 NOTE — Progress Notes (Signed)
PCP - Crosby Oyster, PA-C Cardiologist - Denies  PPM/ICD - Denies Device Orders - n/a Rep Notified - n/a  Chest x-ray - n/a EKG - 07/15/2019 - had it completed when being worked up for stomach issues Stress Test - 11/12/2007 - completed when he was having dizziness issues. Cardiac issues ruled out ECHO - 11/12/2007 - completed when he was having dizziness issues. Cardiac issues ruled out. Cardiac Cath - Denies  Sleep Study - +OSA. Pt wears CPAP nightly. Pressure setting is 3  No DM  Last dose of GLP1 agonist- n/a GLP1 instructions: n/a  Blood Thinner Instructions: n/a Aspirin Instructions: n/a  ERAS Protcol - Clear liquids until 0600 morning of surgery PRE-SURGERY Ensure or G2- Ensure given to pts with instructions  COVID TEST- n/a   Anesthesia review: No.   Patient denies shortness of breath, fever, cough and chest pain at PAT appointment. Pt denies any respiratory illness/infection in the last two months.   All instructions explained to the patient, with a verbal understanding of the material. Patient agrees to go over the instructions while at home for a better understanding. Patient also instructed to self quarantine after being tested for COVID-19. The opportunity to ask questions was provided.

## 2022-09-11 NOTE — Anesthesia Preprocedure Evaluation (Signed)
Anesthesia Evaluation  Patient identified by MRN, date of birth, ID band Patient awake    Reviewed: Allergy & Precautions, NPO status , Patient's Chart, lab work & pertinent test results  Airway Mallampati: III  TM Distance: >3 FB Neck ROM: Full    Dental  (+) Dental Advisory Given   Pulmonary neg shortness of breath, sleep apnea , neg COPD, neg recent URI   Pulmonary exam normal breath sounds clear to auscultation       Cardiovascular (-) hypertension(-) angina (-) Past MI, (-) Cardiac Stents and (-) CABG (-) dysrhythmias  Rhythm:Regular Rate:Normal  HLD   Neuro/Psych neg Seizures PSYCHIATRIC DISORDERS (ADD, psychotic disorder with hallucinations) Anxiety Depression    Learning disability    GI/Hepatic Neg liver ROS,GERD  Medicated,,  Endo/Other  neg diabetes  Thyroid nodule  Renal/GU negative Renal ROS     Musculoskeletal   Abdominal  (+) + obese  Peds  Hematology negative hematology ROS (+)   Anesthesia Other Findings   Reproductive/Obstetrics                             Anesthesia Physical Anesthesia Plan  ASA: 3  Anesthesia Plan: General   Post-op Pain Management: Tylenol PO (pre-op)*   Induction: Intravenous  PONV Risk Score and Plan: 2 and Ondansetron, Dexamethasone and Treatment may vary due to age or medical condition  Airway Management Planned: Oral ETT  Additional Equipment:   Intra-op Plan:   Post-operative Plan: Extubation in OR  Informed Consent: I have reviewed the patients History and Physical, chart, labs and discussed the procedure including the risks, benefits and alternatives for the proposed anesthesia with the patient or authorized representative who has indicated his/her understanding and acceptance.     Dental advisory given  Plan Discussed with: CRNA and Anesthesiologist  Anesthesia Plan Comments: (Risks of general anesthesia discussed including,  but not limited to, sore throat, hoarse voice, chipped/damaged teeth, injury to vocal cords, nausea and vomiting, allergic reactions, lung infection, heart attack, stroke, and death. All questions answered. )       Anesthesia Quick Evaluation

## 2022-09-12 ENCOUNTER — Other Ambulatory Visit: Payer: Self-pay

## 2022-09-12 ENCOUNTER — Ambulatory Visit (HOSPITAL_COMMUNITY): Payer: Medicaid Other | Admitting: Vascular Surgery

## 2022-09-12 ENCOUNTER — Observation Stay (HOSPITAL_COMMUNITY)
Admission: RE | Admit: 2022-09-12 | Discharge: 2022-09-13 | Disposition: A | Discharge status: Home / Self Care | Payer: Medicaid Other | Attending: General Surgery | Admitting: General Surgery

## 2022-09-12 ENCOUNTER — Ambulatory Visit (HOSPITAL_BASED_OUTPATIENT_CLINIC_OR_DEPARTMENT_OTHER): Payer: Medicaid Other | Admitting: Certified Registered"

## 2022-09-12 ENCOUNTER — Encounter (HOSPITAL_COMMUNITY): Payer: Self-pay | Admitting: General Surgery

## 2022-09-12 ENCOUNTER — Encounter (HOSPITAL_COMMUNITY)
Admission: RE | Disposition: A | Discharge status: Home / Self Care | Payer: Self-pay | Source: Home / Self Care | Attending: General Surgery

## 2022-09-12 DIAGNOSIS — G4730 Sleep apnea, unspecified: Secondary | ICD-10-CM | POA: Diagnosis not present

## 2022-09-12 DIAGNOSIS — E785 Hyperlipidemia, unspecified: Secondary | ICD-10-CM | POA: Diagnosis not present

## 2022-09-12 DIAGNOSIS — D440 Neoplasm of uncertain behavior of thyroid gland: Secondary | ICD-10-CM | POA: Diagnosis not present

## 2022-09-12 DIAGNOSIS — F418 Other specified anxiety disorders: Secondary | ICD-10-CM

## 2022-09-12 DIAGNOSIS — E041 Nontoxic single thyroid nodule: Secondary | ICD-10-CM | POA: Diagnosis present

## 2022-09-12 DIAGNOSIS — E890 Postprocedural hypothyroidism: Secondary | ICD-10-CM

## 2022-09-12 HISTORY — PX: THYROIDECTOMY: SHX17

## 2022-09-12 SURGERY — THYROIDECTOMY
Anesthesia: General | Laterality: Bilateral

## 2022-09-12 MED ORDER — ACETAMINOPHEN 500 MG PO TABS: 1000.0000 mg | ORAL_TABLET | ORAL | Status: DC

## 2022-09-12 MED ORDER — 0.9 % SODIUM CHLORIDE (POUR BTL) OPTIME
TOPICAL | Status: DC | PRN
  Administered 2022-09-12: 1000 mL

## 2022-09-12 MED ORDER — FENTANYL CITRATE (PF) 100 MCG/2ML IJ SOLN
INTRAMUSCULAR | Status: AC
  Filled 2022-09-12: qty 2

## 2022-09-12 MED ORDER — TRAMADOL HCL 50 MG PO TABS: 50.0000 mg | ORAL_TABLET | Freq: Four times a day (QID) | ORAL | Status: DC | PRN

## 2022-09-12 MED ORDER — SUCCINYLCHOLINE CHLORIDE 200 MG/10ML IV SOSY
PREFILLED_SYRINGE | INTRAVENOUS | Status: DC | PRN
  Administered 2022-09-12: 140 mg via INTRAVENOUS

## 2022-09-12 MED ORDER — ONDANSETRON HCL 4 MG/2ML IJ SOLN: 4.0000 mg | Freq: Four times a day (QID) | INTRAMUSCULAR | Status: DC | PRN

## 2022-09-12 MED ORDER — DEXTROSE-NACL 5-0.9 % IV SOLN: INTRAVENOUS | Status: DC

## 2022-09-12 MED ORDER — ROCURONIUM BROMIDE 10 MG/ML (PF) SYRINGE
PREFILLED_SYRINGE | INTRAVENOUS | Status: DC | PRN
  Administered 2022-09-12: 50 mg via INTRAVENOUS

## 2022-09-12 MED ORDER — OXYCODONE HCL 5 MG/5ML PO SOLN
5.0000 mg | Freq: Once | ORAL | Status: AC | PRN
  Administered 2022-09-12: 5 mg via ORAL

## 2022-09-12 MED ORDER — PROPOFOL 10 MG/ML IV BOLUS
INTRAVENOUS | Status: DC | PRN
  Administered 2022-09-12: 200 mg via INTRAVENOUS

## 2022-09-12 MED ORDER — MIDAZOLAM HCL 2 MG/2ML IJ SOLN
INTRAMUSCULAR | Status: AC
  Filled 2022-09-12: qty 2

## 2022-09-12 MED ORDER — DEXAMETHASONE SODIUM PHOSPHATE 10 MG/ML IJ SOLN
INTRAMUSCULAR | Status: DC | PRN
  Administered 2022-09-12: 10 mg via INTRAVENOUS

## 2022-09-12 MED ORDER — DEXMEDETOMIDINE HCL IN NACL 200 MCG/50ML IV SOLN
INTRAVENOUS | Status: DC | PRN
  Administered 2022-09-12 (×2): 8 ug via INTRAVENOUS
  Administered 2022-09-12: 4 ug via INTRAVENOUS

## 2022-09-12 MED ORDER — LIDOCAINE 2% (20 MG/ML) 5 ML SYRINGE
INTRAMUSCULAR | Status: DC | PRN
  Administered 2022-09-12: 100 mg via INTRAVENOUS

## 2022-09-12 MED ORDER — PHENOL 1.4 % MT LIQD: 1.0000 | OROMUCOSAL | Status: DC | PRN

## 2022-09-12 MED ORDER — HYDROMORPHONE HCL 1 MG/ML IJ SOLN
1.0000 mg | INTRAMUSCULAR | Status: DC | PRN
  Administered 2022-09-12: 1 mg via INTRAVENOUS
  Filled 2022-09-12: qty 1

## 2022-09-12 MED ORDER — CHLORHEXIDINE GLUCONATE 0.12 % MT SOLN
15.0000 mL | Freq: Once | OROMUCOSAL | Status: AC
  Administered 2022-09-12: 15 mL via OROMUCOSAL
  Filled 2022-09-12: qty 15

## 2022-09-12 MED ORDER — ENSURE PRE-SURGERY PO LIQD: 296.0000 mL | Freq: Once | ORAL | Status: DC

## 2022-09-12 MED ORDER — LACTATED RINGERS IV SOLN: INTRAVENOUS | Status: DC

## 2022-09-12 MED ORDER — OXYCODONE HCL 5 MG PO TABS
5.0000 mg | ORAL_TABLET | ORAL | Status: DC | PRN
  Administered 2022-09-12 – 2022-09-13 (×3): 10 mg via ORAL
  Filled 2022-09-12 (×3): qty 2

## 2022-09-12 MED ORDER — PROPOFOL 10 MG/ML IV BOLUS: INTRAVENOUS | Status: DC | PRN

## 2022-09-12 MED ORDER — FENTANYL CITRATE (PF) 250 MCG/5ML IJ SOLN
INTRAMUSCULAR | Status: DC | PRN
  Administered 2022-09-12 (×2): 100 ug via INTRAVENOUS
  Administered 2022-09-12: 50 ug via INTRAVENOUS

## 2022-09-12 MED ORDER — BUPIVACAINE HCL 0.25 % IJ SOLN
INTRAMUSCULAR | Status: DC | PRN
  Administered 2022-09-12: 10 mL

## 2022-09-12 MED ORDER — FENTANYL CITRATE (PF) 100 MCG/2ML IJ SOLN
25.0000 ug | INTRAMUSCULAR | Status: DC | PRN
  Administered 2022-09-12: 25 ug via INTRAVENOUS

## 2022-09-12 MED ORDER — PHENYLEPHRINE 80 MCG/ML (10ML) SYRINGE FOR IV PUSH (FOR BLOOD PRESSURE SUPPORT)
PREFILLED_SYRINGE | INTRAVENOUS | Status: DC | PRN
  Administered 2022-09-12: 80 ug via INTRAVENOUS
  Administered 2022-09-12: 40 ug via INTRAVENOUS
  Administered 2022-09-12 (×4): 80 ug via INTRAVENOUS

## 2022-09-12 MED ORDER — ONDANSETRON HCL 4 MG/2ML IJ SOLN
INTRAMUSCULAR | Status: DC | PRN
  Administered 2022-09-12: 4 mg via INTRAVENOUS

## 2022-09-12 MED ORDER — OXYCODONE HCL 5 MG/5ML PO SOLN
ORAL | Status: AC
  Filled 2022-09-12: qty 5

## 2022-09-12 MED ORDER — SUGAMMADEX SODIUM 200 MG/2ML IV SOLN
INTRAVENOUS | Status: DC | PRN
  Administered 2022-09-12: 200 mg via INTRAVENOUS

## 2022-09-12 MED ORDER — CEFAZOLIN SODIUM-DEXTROSE 2-4 GM/100ML-% IV SOLN
2.0000 g | INTRAVENOUS | Status: AC
  Administered 2022-09-12: 2 g via INTRAVENOUS
  Filled 2022-09-12: qty 100

## 2022-09-12 MED ORDER — CHLORHEXIDINE GLUCONATE CLOTH 2 % EX PADS: 6.0000 | MEDICATED_PAD | Freq: Once | CUTANEOUS | Status: DC

## 2022-09-12 MED ORDER — OXYCODONE HCL 5 MG PO TABS: 5.0000 mg | ORAL_TABLET | Freq: Once | ORAL | Status: AC | PRN

## 2022-09-12 MED ORDER — ACETAMINOPHEN 500 MG PO TABS
1000.0000 mg | ORAL_TABLET | Freq: Once | ORAL | Status: AC
  Administered 2022-09-12: 1000 mg via ORAL
  Filled 2022-09-12: qty 2

## 2022-09-12 MED ORDER — SUCCINYLCHOLINE CHLORIDE 200 MG/10ML IV SOSY: PREFILLED_SYRINGE | INTRAVENOUS | Status: DC | PRN

## 2022-09-12 MED ORDER — ONDANSETRON 4 MG PO TBDP: 4.0000 mg | ORAL_TABLET | Freq: Four times a day (QID) | ORAL | Status: DC | PRN

## 2022-09-12 MED ORDER — PROPOFOL 10 MG/ML IV BOLUS
INTRAVENOUS | Status: AC
  Filled 2022-09-12: qty 20

## 2022-09-12 MED ORDER — MIDAZOLAM HCL 2 MG/2ML IJ SOLN
INTRAMUSCULAR | Status: DC | PRN
  Administered 2022-09-12: 2 mg via INTRAVENOUS

## 2022-09-12 MED ORDER — ORAL CARE MOUTH RINSE: 15.0000 mL | Freq: Once | OROMUCOSAL | Status: AC

## 2022-09-12 MED ORDER — FENTANYL CITRATE (PF) 250 MCG/5ML IJ SOLN
INTRAMUSCULAR | Status: AC
  Filled 2022-09-12: qty 5

## 2022-09-12 MED ORDER — AMISULPRIDE (ANTIEMETIC) 5 MG/2ML IV SOLN: 10.0000 mg | Freq: Once | INTRAVENOUS | Status: DC | PRN

## 2022-09-12 SURGICAL SUPPLY — 55 items
ADH SKN CLS APL DERMABOND .7 (GAUZE/BANDAGES/DRESSINGS) ×1
ADH SKN CLS LQ APL DERMABOND (GAUZE/BANDAGES/DRESSINGS) ×1
APL PRP STRL LF DISP 70% ISPRP (MISCELLANEOUS) ×1
ATTRACTOMAT 16X20 MAGNETIC DRP (DRAPES) ×2 IMPLANT
BAG COUNTER SPONGE SURGICOUNT (BAG) ×2 IMPLANT
BAG SPNG CNTER NS LX DISP (BAG) ×1
CANISTER SUCT 3000ML PPV (MISCELLANEOUS) ×2 IMPLANT
CHLORAPREP W/TINT 26 (MISCELLANEOUS) ×2 IMPLANT
CLIP TI MEDIUM 24 (CLIP) ×2 IMPLANT
CLIP TI MEDIUM 6 (CLIP) IMPLANT
CLIP TI WIDE RED SMALL 24 (CLIP) ×2 IMPLANT
CLIP TI WIDE RED SMALL 6 (CLIP) IMPLANT
COVER SURGICAL LIGHT HANDLE (MISCELLANEOUS) ×2 IMPLANT
DERMABOND ADVANCED .7 DNX12 (GAUZE/BANDAGES/DRESSINGS) ×2 IMPLANT
DERMABOND ADVANCED .7 DNX6 (GAUZE/BANDAGES/DRESSINGS) IMPLANT
DISSECTOR SURG LIGASURE 21 (MISCELLANEOUS) IMPLANT
DRAPE LAPAROTOMY 100X72 PEDS (DRAPES) ×2 IMPLANT
ELECT COATED BLADE 2.86 ST (ELECTRODE) ×2 IMPLANT
ELECT REM PT RETURN 9FT ADLT (ELECTROSURGICAL) ×1
ELECTRODE REM PT RTRN 9FT ADLT (ELECTROSURGICAL) ×2 IMPLANT
GAUZE 4X4 16PLY ~~LOC~~+RFID DBL (SPONGE) ×2 IMPLANT
GAUZE SPONGE 4X4 12PLY STRL (GAUZE/BANDAGES/DRESSINGS) ×2 IMPLANT
GLOVE BIO SURGEON STRL SZ7.5 (GLOVE) ×4 IMPLANT
GOWN STRL REUS W/ TWL LRG LVL3 (GOWN DISPOSABLE) ×2 IMPLANT
GOWN STRL REUS W/ TWL XL LVL3 (GOWN DISPOSABLE) ×2 IMPLANT
GOWN STRL REUS W/TWL LRG LVL3 (GOWN DISPOSABLE) ×1
GOWN STRL REUS W/TWL XL LVL3 (GOWN DISPOSABLE) ×1
HANDLE YANKAUER SUCT OPEN TIP (MISCELLANEOUS) IMPLANT
HEMOSTAT SURGICEL 2X4 FIBR (HEMOSTASIS) ×2 IMPLANT
ILLUMINATOR WAVEGUIDE N/F (MISCELLANEOUS) ×2 IMPLANT
KIT BASIN OR (CUSTOM PROCEDURE TRAY) ×2 IMPLANT
KIT TURNOVER KIT B (KITS) ×2 IMPLANT
NDL HYPO 25GX1X1/2 BEV (NEEDLE) ×2 IMPLANT
NEEDLE HYPO 25GX1X1/2 BEV (NEEDLE) ×1 IMPLANT
NS IRRIG 1000ML POUR BTL (IV SOLUTION) ×2 IMPLANT
PACK GENERAL/GYN (CUSTOM PROCEDURE TRAY) ×2 IMPLANT
PAD ARMBOARD 7.5X6 YLW CONV (MISCELLANEOUS) ×2 IMPLANT
PENCIL SMOKE EVACUATOR (MISCELLANEOUS) ×2 IMPLANT
POSITIONER HEAD DONUT 9IN (MISCELLANEOUS) ×2 IMPLANT
SHEARS HARMONIC 9CM CVD (BLADE) ×2 IMPLANT
SPECIMEN JAR MEDIUM (MISCELLANEOUS) ×2 IMPLANT
SPONGE INTESTINAL PEANUT (DISPOSABLE) ×2 IMPLANT
SUT MNCRL AB 4-0 PS2 18 (SUTURE) ×2 IMPLANT
SUT SILK 2 0 (SUTURE) ×1
SUT SILK 2-0 18XBRD TIE 12 (SUTURE) ×2 IMPLANT
SUT SILK 3 0 (SUTURE) ×1
SUT SILK 3-0 18XBRD TIE 12 (SUTURE) ×2 IMPLANT
SUT VIC AB 3-0 SH 18 (SUTURE) ×2 IMPLANT
SUT VIC AB 3-0 SH 27 (SUTURE) ×1
SUT VIC AB 3-0 SH 27XBRD (SUTURE) IMPLANT
SUT VIC AB 3-0 SH 8-18 (SUTURE) IMPLANT
SYR CONTROL 10ML LL (SYRINGE) ×2 IMPLANT
TOWEL GREEN STERILE (TOWEL DISPOSABLE) ×2 IMPLANT
TOWEL GREEN STERILE FF (TOWEL DISPOSABLE) ×2 IMPLANT
TUBE CONNECTING 12X1/4 (SUCTIONS) IMPLANT

## 2022-09-12 NOTE — Anesthesia Procedure Notes (Signed)
Procedure Name: Intubation Date/Time: 09/12/2022 9:34 AM  Performed by: Alwyn Ren, CRNAPre-anesthesia Checklist: Patient identified, Emergency Drugs available, Suction available and Patient being monitored Patient Re-evaluated:Patient Re-evaluated prior to induction Oxygen Delivery Method: Circle system utilized Preoxygenation: Pre-oxygenation with 100% oxygen Induction Type: IV induction and Rapid sequence Laryngoscope Size: Glidescope and 3 Grade View: Grade II Tube type: Oral Tube size: 7.5 mm Number of attempts: 1 Airway Equipment and Method: Stylet and Video-laryngoscopy Placement Confirmation: ETT inserted through vocal cords under direct vision, positive ETCO2 and breath sounds checked- equal and bilateral Secured at: 23 cm Tube secured with: Tape Dental Injury: Teeth and Oropharynx as per pre-operative assessment  Difficulty Due To: Difficulty was unanticipated Comments: Performed by Berneta Levins. RSI performed due to severe Hx GERD

## 2022-09-12 NOTE — Anesthesia Postprocedure Evaluation (Signed)
Anesthesia Post Note  Patient: [name]  Procedure(s) Performed: TOTAL THYROIDECTOMY (Bilateral)     Patient location during evaluation: PACU Anesthesia Type: General Level of consciousness: awake Pain management: pain level controlled Vital Signs Assessment: post-procedure vital signs reviewed and stable Respiratory status: spontaneous breathing, nonlabored ventilation and respiratory function stable Cardiovascular status: blood pressure returned to baseline and stable Postop Assessment: no apparent nausea or vomiting Anesthetic complications: yes   Encounter Notable Events  Notable Event Outcome Phase Comment  Difficult to intubate - unexpected  Intraprocedure Filed from anesthesia note documentation.    Last Vitals:  Vitals:   09/12/22 1315 09/12/22 1331  BP: (!) 157/87 134/82  Pulse: 75 79  Resp: (!) 21 18  Temp: 36.6 C 36.7 C  SpO2: 95% 98%    Last Pain:  Vitals:   09/12/22 1341  TempSrc:   PainSc: 8                  Linton Rump

## 2022-09-12 NOTE — Transfer of Care (Signed)
Immediate Anesthesia Transfer of Care Note  Patient: [name]  Procedure(s) Performed: TOTAL THYROIDECTOMY (Bilateral)  Patient Location: PACU  Anesthesia Type:General  Level of Consciousness: drowsy and patient cooperative  Airway & Oxygen Therapy: Patient Spontanous Breathing and Patient connected to face mask oxygen  Post-op Assessment: Report given to RN, Post -op Vital signs reviewed and stable, Patient moving all extremities X 4, and Patient able to stick tongue midline  Post vital signs: Reviewed and stable  Last Vitals:  Vitals Value Taken Time  BP 141/75 09/12/22 1218  Temp    Pulse 85 09/12/22 1222  Resp 19 09/12/22 1222  SpO2 99 % 09/12/22 1222  Vitals shown include unvalidated device data.  Last Pain:  Vitals:   09/12/22 0648  TempSrc: Oral  PainSc: 0-No pain         Complications:  Encounter Notable Events  Notable Event Outcome Phase Comment  Difficult to intubate - unexpected  Intraprocedure Filed from anesthesia note documentation.

## 2022-09-12 NOTE — TOC CM/SW Note (Signed)
  Transition of Care [facility]) Screening Note   Patient Details  Name: [name] Date of Birth: 04-29-1986   S/P TOTAL THYROIDECTOMY     Transition of Care Department [facility]) has reviewed patient and no TOC needs have been identified at this time. We will continue to monitor patient advancement through interdisciplinary progression rounds. If new patient transition needs arise, please place a TOC consult.

## 2022-09-12 NOTE — H&P (Signed)
Chief Complaint: New Consultation ( thyroid,)   History of Present Illness: [name] is a 37 y.o. male who is seen today as an office consultation for evaluation of New Consultation ( thyroid,)     Patient is a 37 year old male comes in secondary to thyroid nodules. Patient states that over the last 2 to 3 months he has noted some suffocation type symptoms, dysphagia.  He underwent ultrasound.  This did show thyroid nodules.  These were biopsied and Affirma was sent.  Afirma did show 75% chance of malignancy.   Discussed with the patient he has had some pressure and suffocation type symptoms when sleeping at night.  He also feels that he has some dysphagia with foods.  He states that he had some coughing attacks which he attributes to the thyroid.        Review of Systems: A complete review of systems was obtained from the patient.  I have reviewed this information and discussed as appropriate with the patient.  See HPI as well for other ROS.   Review of Systems  Constitutional:  Negative for fever.  HENT:  Negative for congestion.   Eyes:  Negative for blurred vision.  Respiratory:  Negative for cough, shortness of breath and wheezing.   Cardiovascular:  Negative for chest pain and palpitations.  Gastrointestinal:  Negative for heartburn.  Genitourinary:  Negative for dysuria.  Musculoskeletal:  Negative for myalgias.  Skin:  Negative for rash.  Neurological:  Negative for dizziness and headaches.  Psychiatric/Behavioral:  Negative for depression and suicidal ideas.   All other systems reviewed and are negative.     Medical History: Past Medical History Past Medical History: Diagnosis        Date            Sleep apnea            There is no problem list on file for this patient.     Past Surgical History History reviewed. No pertinent surgical history.     Allergies No Known Allergies     Current Outpatient Medications on File Prior to  Visit Medication       Sig       Dispense         Refill            dextroamphetamine-amphetamine (ADDERALL) 30 mg tablet        Take 1 tablet by mouth once daily                            famotidine (PEPCID) 40 MG tablet    Take 40 mg by mouth at bedtime                                omeprazole (PRILOSEC) 40 MG DR capsule                                       No current facility-administered medications on file prior to visit.     Family History History reviewed. No pertinent family history.     Social History   Tobacco Use Smoking Status           Never Smokeless Tobacco   Never     Social History Social History     Socioeconomic History  Marital status:  Single Tobacco Use            Smoking status:          Never            Smokeless tobacco:    Never Substance and Sexual Activity            Alcohol use:    Not Currently            Drug use:        Never       Objective:   BP 133/82   Pulse 72   Temp 98.4 F (36.9 C) (Oral)   Resp 18   Ht 6\' 2"  (1.88 m)   Wt 116.1 kg   SpO2 97%   BMI 32.87 kg/m   Physical Exam Constitutional:      General: He is not in acute distress.    Appearance: Normal appearance.  HENT:     Head: Normocephalic.     Nose: No rhinorrhea.     Mouth/Throat:     Mouth: Mucous membranes are moist.     Pharynx: Oropharynx is clear.  Eyes:     General: No scleral icterus.    Pupils: Pupils are equal, round, and reactive to light.  Cardiovascular:     Rate and Rhythm: Normal rate.     Pulses: Normal pulses.  Pulmonary:     Effort: Pulmonary effort is normal. No respiratory distress.     Breath sounds: No stridor. No wheezing.  Abdominal:     General: Abdomen is flat. There is no distension.     Tenderness: There is no abdominal tenderness. There is no guarding or rebound.  Musculoskeletal:        General: Normal range of motion.     Cervical back: Normal range of motion and neck supple.  Skin:    General: Skin is  warm and dry.     Capillary Refill: Capillary refill takes less than 2 seconds.     Coloration: Skin is not jaundiced.  Neurological:     General: No focal deficit present.     Mental Status: He is alert and oriented to person, place, and time. Mental status is at baseline.  Psychiatric:        Mood and Affect: Mood normal.        Thought Content: Thought content normal.        Judgment: Judgment normal.      THYROID ULTRASOUND   TECHNIQUE: Ultrasound examination of the thyroid gland and adjacent soft tissues was performed.   COMPARISON:  CT neck, 10/25/2003.   FINDINGS: Parenchymal Echotexture: Moderately heterogenous   Isthmus: 0.6 cm   Right lobe: 10.3 x 3.9 x 7.0 cm   Left lobe: 5.8 x 1.3 x 1.3 cm   _________________________________________________________   Estimated total number of nodules >/= 1 cm: 3   Number of spongiform nodules >/=  2 cm not described below (TR1): 0   Number of mixed cystic and solid nodules >/= 1.5 cm not described below (TR2): 0   _________________________________________________________   Nodule # 1:   Location: RIGHT; Superior   Maximum size: 3.9 cm; Other 2 dimensions: 2.5 x 2.3 cm   Composition: solid/almost completely solid (2)   Echogenicity: isoechoic (1)   Shape: not taller-than-wide (0)   Margins: ill-defined (0)   Echogenic foci: none (0)   ACR TI-RADS total points: 3.   ACR TI-RADS risk category: TR3 (3 points).  ACR TI-RADS recommendations:   **Given size (>/= 2.5 cm) and appearance, fine needle aspiration of this mildly suspicious nodule should be considered based on TI-RADS criteria.   _________________________________________________________   Nodule # 2:   Location: RIGHT; Mid   Maximum size: 4.6 cm; Other 2 dimensions: 4.5 x 4.0 cm   Composition: solid/almost completely solid (2)   Echogenicity: isoechoic (1)   Shape: not taller-than-wide (0)   Margins: ill-defined (0)   Echogenic foci:  none (0)   ACR TI-RADS total points: 3.   ACR TI-RADS risk category: TR3 (3 points).   ACR TI-RADS recommendations:   **Given size (>/= 2.5 cm) and appearance, fine needle aspiration of this mildly suspicious nodule should be considered based on TI-RADS criteria.   _________________________________________________________   Nodule # 3:   Location: LEFT; Mid   Maximum size: 2.6 cm; Other 2 dimensions: 1.0 x 1.4 cm   Composition: solid/almost completely solid (2)   Echogenicity: hypoechoic (2)   Shape: not taller-than-wide (0)   Margins: ill-defined (0)   Echogenic foci: none (0)   ACR TI-RADS total points: 4.   ACR TI-RADS risk category: TR4 (4-6 points).   ACR TI-RADS recommendations:   **Given size (>/= 1.5 cm) and appearance, fine needle aspiration of this moderately suspicious nodule should be considered based on TI-RADS criteria.   _________________________________________________________   No cervical adenopathy or abnormal fluid collection within the imaged neck.   IMPRESSION: 1. Enlarged multinodular thyroid, consistent with a goiter. 2. 3.9 cm RIGHT superior TR-3, 4.6 cm RIGHT mid TR-3 and 2.6 cm LEFT mid TR-4 thyroid nodules. FNA biopsy of these suspicious nodules should be considered based on TI-RADS criteria. 3. For feasibility, consider biopsy of nodules #2 (4.6 cm RIGHT mid TR-3) and #3 (2.6 cm LEFT mid TR-4) since only 2 nodules can be biopsied at a time.       Assessment and Plan:    Diagnoses and all orders for this visit:   Thyroid nodule     37 year old male with thyroid nodule, thyromegaly and compressive symptoms.  Patient has enough from her that says 75% chance of malignancy.  I believe is coupled together with his symptoms he would benefit from total thyroidectomy. 1.  Will proceed to the operating for total thyroidectomy.  I discussed with him the risk and benefits of the procedure to include but not limited to: Infection,  bleeding, damage surrounding structures, possible need for further surgery or tracheostomy.  Patient voiced understanding wished to proceed. 2.  I did discuss with him that he would require lifelong thyroid replacement hormone.   Axel Filler, MD

## 2022-09-12 NOTE — Op Note (Signed)
09/12/2022  11:52 AM  PATIENT:  [name]  37 y.o. male  PRE-OPERATIVE DIAGNOSIS:  THYROID NODULE  POST-OPERATIVE DIAGNOSIS:  THYROID NODULE  PROCEDURE:  Procedure(s) with comments: TOTAL THYROIDECTOMY   SURGEON:  Surgeon(s) and Role:    * Axel Filler, MD - Primary  ASSISTANTS: Rockwell Germany, RN   ANESTHESIA:   local and general  EBL:  100 mL   BLOOD ADMINISTERED:none  DRAINS: none   LOCAL MEDICATIONS USED:  BUPIVICAINE   SPECIMEN:  Source of Specimen:  thryoid, Right superior lobe with stitch  DISPOSITION OF SPECIMEN:  PATHOLOGY  COUNTS:  YES  TOURNIQUET:  * No tourniquets in log *  DICTATION: .Dragon Dictation  Findings: Large right thyroid lobe, normal sized left thyroid lobe  Indications for procedure: The patient is a 67M who had a right thyroid nodule.  Pt was counseled in clinic and due to high risk of Ca pt decided to have his total thyroid removed.  Details of the procedure:    The patient was taken back to the operating room. The patient was placed in supine position with bilateral SCDs in place.  The patient was prepped and draped in the usual sterile fashion. After appropriate anitbiotics were confirmed, a time-out was confirmed and all facts were verified.   A 4 cm incision was made approximately 2 fingerbreadths above the sternal notch. Bovie cautery was used to maintain hemostasis dissection was carried down through the platysma. The platysma was elevated and flaps were created superiorly and inferiorly to the thyroid cartilage as well as the sternal notch, repsectively. The strap muscles were identified in the midline and separated. Right-sided strap muscles were elevated off the anterior surface of the thyroid. This dissection was carried laterally. The middle thyroid vein was identified and doubly ligated. We proceeded to dissect away the superior lobe and Kitners were used to gently dissect the surrounding musculature from the thyroid. The  superior thyroid vessels were identified and doubly ligated with clips and transected with a ligaure device. At this time this freed up the superior lobe was able to deliver this into the wound. We also identified the superior parathyroid gland which we preserved.   We continued to dissect the thyroid off of the trachea from lateral to medial direction. The right recurrent laryngeal nerve was identified and protected.  The inferior thyroid vessels were identified ligated with clips. At this time Berry's ligament was dissected away from the trachea. This delivered the right lobe of the thyroid into the wound and the harmonic scalpel was used to divide the thyroid in the midline.   The left side thyroid was dissected in a similar fashion.  The parathyroid glands were not seen.  The left recurrent laryngeal nerve was not identified.  The left lobe was adherent to the strap muscle and require meticulous dissection.  The lobe was removed and sent to pathology.  The bilateral areas were irrigated out. The dissection bed was hemostatic. We placed fibrillar hemostatic agent into the wound. Strap muscles were then reapproximated in the midline with interrupted 3-0 Vicryl stitches. The platysma was reapproximated using 3-0 Vicryl stitches in interrupted fashion. Skin was then reapproximated using a running subcuticular 4-0 Monocryl. The skin was then dressed with Dermabond. The patient was taken to the recovery room in stable condition.   PLAN OF CARE: Admit for overnight observation  PATIENT DISPOSITION:  PACU - hemodynamically stable.   Delay start of Pharmacological VTE agent (>24hrs) due to surgical blood loss or  risk of bleeding: not applicable

## 2022-09-13 ENCOUNTER — Encounter (HOSPITAL_COMMUNITY): Payer: Self-pay | Admitting: General Surgery

## 2022-09-13 DIAGNOSIS — E041 Nontoxic single thyroid nodule: Secondary | ICD-10-CM | POA: Diagnosis not present

## 2022-09-13 LAB — COMPREHENSIVE METABOLIC PANEL
ALT: 29 U/L (ref 0–44)
AST: 22 U/L (ref 15–41)
Albumin: 3.9 g/dL (ref 3.5–5.0)
Alkaline Phosphatase: 34 U/L — ABNORMAL LOW (ref 38–126)
Anion gap: 10 (ref 5–15)
BUN: 9 mg/dL (ref 6–20)
CO2: 25 mmol/L (ref 22–32)
Calcium: 9 mg/dL (ref 8.9–10.3)
Chloride: 100 mmol/L (ref 98–111)
Creatinine, Ser: 1.09 mg/dL (ref 0.61–1.24)
GFR, Estimated: >60 mL/min (ref >60)
Glucose, Bld: 122 mg/dL — ABNORMAL HIGH (ref 70–99)
Potassium: 4.3 mmol/L (ref 3.5–5.1)
Sodium: 135 mmol/L (ref 135–145)
Total Bilirubin: 0.9 mg/dL (ref 0.3–1.2)
Total Protein: 7.1 g/dL (ref 6.5–8.1)

## 2022-09-13 MED ORDER — CALCIUM CARBONATE ANTACID 500 MG PO CHEW: 1.0000 | CHEWABLE_TABLET | Freq: Every day | ORAL | 3 refills | Status: AC

## 2022-09-13 MED ORDER — LEVOTHYROXINE SODIUM 150 MCG PO TABS: 150.0000 ug | ORAL_TABLET | Freq: Every day | ORAL | 11 refills | Status: DC

## 2022-09-13 MED ORDER — OXYCODONE HCL 5 MG PO TABS: 5.0000 mg | ORAL_TABLET | ORAL | 0 refills | Status: DC | PRN

## 2022-09-13 NOTE — Discharge Summary (Signed)
Physician Discharge Summary  Patient ID: FARD BORUNDA MRN: [id number] DOB/AGE: Oct 05, 1985 37 y.o.  Admit date: 09/12/2022 Discharge date: 09/13/2022  Admission Diagnoses:thyroid nodule  Discharge Diagnoses:  Principal Problem:   S/P thyroidectomy   Discharged Condition: good  Hospital Course: Pt was admitted post op to the floor.  Please see op note for full details.  Pt did well post op and min pain.  He was tol PO well.  He was otherwise amb well on his own.  He was deemed stable for DC and Dc'd home.  Consults: None  Significant Diagnostic Studies: labs: CA=9  Treatments: surgery: as above  Discharge Exam: Blood pressure 135/79, pulse 71, temperature 98 F (36.7 C), temperature source Oral, resp. rate 17, height 6\' 2"  (1.88 m), weight 116.1 kg, SpO2 97 %. General appearance: alert and cooperative GI: soft, non-tender; bowel sounds normal; no masses,  no organomegaly and   Inc: c/d/i  Disposition: Discharge disposition: 01-Home or Self Care       Discharge Instructions     Diet - low sodium heart healthy   Complete by: As directed    Increase activity slowly   Complete by: As directed       Allergies as of 09/13/2022       Reactions   Grass Extracts [gramineae Pollens] Swelling        Medication List     TAKE these medications    albuterol 108 (90 Base) MCG/ACT inhaler Commonly known as: VENTOLIN HFA Inhale 2 puffs into the lungs every 6 (six) hours as needed for wheezing or shortness of breath.   amphetamine-dextroamphetamine 30 MG tablet Commonly known as: Adderall Take 1 tablet by mouth daily.   calcium carbonate 500 MG chewable tablet Commonly known as: Tums Chew 1 tablet (200 mg of elemental calcium total) by mouth daily.   famotidine 40 MG tablet Commonly known as: PEPCID Take 1 tablet (40 mg total) by mouth at bedtime.   hydrocortisone 2.5 % lotion Apply 1 application  topically daily as needed (Darkness on face).    ketoconazole 2 % shampoo Commonly known as: NIZORAL Apply topically 2 (two) times a week.   levothyroxine 150 MCG tablet Commonly known as: Synthroid Take 1 tablet (150 mcg total) by mouth daily.   multivitamin with minerals tablet Take 1 tablet by mouth daily. Vita fusion   omeprazole 40 MG capsule Commonly known as: PRILOSEC Take 40 mg by mouth daily.   oxyCODONE 5 MG immediate release tablet Commonly known as: Oxy IR/ROXICODONE Take 1-2 tablets (5-10 mg total) by mouth every 4 (four) hours as needed for moderate pain.   rosuvastatin 10 MG tablet Commonly known as: Crestor Take 1 tablet (10 mg total) by mouth daily.   Selenium Sulfide 2.25 % Sham Apply 1 Application topically every other day.        Follow-up Information     Axel Filler, MD. Schedule an appointment as soon as possible for a visit in 2 week(s).   Specialty: General Surgery Why: Post op visit Contact information: [address] [place] [address] [place] [address] [phone]                 Signed: Axel Filler 09/13/2022, 7:51 AM

## 2022-09-13 NOTE — Progress Notes (Signed)
Milderd Meager to be D/C'd Home per MD order.  Discussed with the patient and all questions fully answered.   VSS, Skin clean, dry and intact without evidence of skin break down, no evidence of skin tears noted. IV catheter discontinued intact. Site without signs and symptoms of complications. Dressing and pressure applied.   An After Visit Summary was printed and given to the patient.    D/C education completed with patient/family including follow up instructions, medication list, d/c activities limitations if indicated, with other d/c instructions as indicated by MD - patient able to verbalize understanding, all questions fully answered.    Patient instructed to return to ED, call 911, or call MD for any changes in condition.    Patient escorted via WC, and D/C home via car with family.

## 2022-09-16 ENCOUNTER — Telehealth: Payer: Self-pay

## 2022-09-16 NOTE — Transitions of Care (Post Inpatient/ED Visit) (Unsigned)
   09/16/2022  Name: [name] MRN: [id number] DOB: 08-09-1985  Today's TOC FU Call Status: Today's TOC FU Call Status:: Unsuccessul Call (1st Attempt) Unsuccessful Call (1st Attempt) Date: 09/16/22  Attempted to reach the patient regarding the most recent Inpatient/ED visit.  Follow Up Plan: Additional outreach attempts will be made to reach the patient to complete the Transitions of Care (Post Inpatient/ED visit) call.   Signature Kandis Cocking, CMA (AAMA)  [facility] [phone]

## 2022-09-17 ENCOUNTER — Telehealth: Payer: Self-pay

## 2022-09-17 NOTE — Transitions of Care (Post Inpatient/ED Visit) (Signed)
   09/17/2022  Name: ADEM COSTLOW MRN: [id number] DOB: 1985/09/28  Today's TOC FU Call Status: Today's TOC FU Call Status:: Successful TOC FU Call Competed Unsuccessful Call (1st Attempt) Date: 09/16/22 [facility] FU Call Complete Date: 09/17/22  Transition Care Management Follow-up Telephone Call Date of Discharge: 09/13/22 Discharge Facility: [facility]) Type of Discharge: Inpatient Admission Primary Inpatient Discharge Diagnosis:: Thyroidectomy How have you been since you were released from the hospital?: Better Any questions or concerns?: No  Items Reviewed: Did you receive and understand the discharge instructions provided?: Yes Medications obtained and verified?: Yes (Medications Reviewed) Any new allergies since your discharge?: No Dietary orders reviewed?: No Do you have support at home?: Yes People in Home: significant other  Home Care and Equipment/Supplies: Were Home Health Services Ordered?: No Any new equipment or medical supplies ordered?: No  Functional Questionnaire: Do you need assistance with bathing/showering or dressing?: No Do you need assistance with meal preparation?: No Do you need assistance with eating?: No Do you have difficulty maintaining continence: No Do you need assistance with getting out of bed/getting out of a chair/moving?: No Do you have difficulty managing or taking your medications?: No  Follow up appointments reviewed: PCP Follow-up appointment confirmed?: NA MD Provider Line Number:[phone] Given: Yes Specialist Hospital Follow-up appointment confirmed?: Yes Date of Specialist follow-up appointment?: 10/04/22 Follow-Up Specialty Provider:: Dr. Harden Mo Do you need transportation to your follow-up appointment?: No Do you understand care options if your condition(s) worsen?: Yes-patient verbalized understanding    SIGNATURE:   Kandis Cocking, CMA (AAMA)  [facility] [phone]

## 2022-09-19 LAB — SURGICAL PATHOLOGY

## 2022-11-18 ENCOUNTER — Telehealth: Payer: Self-pay

## 2022-11-18 NOTE — Telephone Encounter (Signed)
Left message for patient to return call to our office to schedule 1 year follow up appointment with Hyacinth Meeker, PA-C dx dysphagia.  Will continue efforts.

## 2022-11-18 NOTE — Telephone Encounter (Signed)
-----   Message from Lamona Curl, [place] sent at 12/24/2021 11:02 AM EDT ----- Regarding: August 2024 1 year, Victorino Dike, dx dysphagia, GERD-esophagitis

## 2022-11-20 NOTE — Telephone Encounter (Signed)
Left message for patient to return call to our office to schedule 1 year follow up appointment with Hyacinth Meeker, PA-C dx dysphagia.  Will continue efforts.

## 2022-11-25 NOTE — Telephone Encounter (Signed)
Patient aware that he is scheduled for a 1 yr follow up on 02-05-23 at 3pm with Hyacinth Meeker, PA-C.  Patient agreed to plan and verbalized understanding.  No further questions.

## 2022-12-27 ENCOUNTER — Encounter: Payer: Self-pay | Admitting: Internal Medicine

## 2022-12-27 ENCOUNTER — Ambulatory Visit (INDEPENDENT_AMBULATORY_CARE_PROVIDER_SITE_OTHER): Payer: MEDICAID | Admitting: Internal Medicine

## 2022-12-27 VITALS — BP 140/90 | HR 72 | Ht 74.0 in | Wt 260.4 lb

## 2022-12-27 DIAGNOSIS — D497 Neoplasm of unspecified behavior of endocrine glands and other parts of nervous system: Secondary | ICD-10-CM | POA: Diagnosis not present

## 2022-12-27 DIAGNOSIS — E890 Postprocedural hypothyroidism: Secondary | ICD-10-CM

## 2022-12-27 LAB — TSH: TSH: 0.21 u[IU]/mL — ABNORMAL LOW (ref 0.35–5.50)

## 2022-12-27 LAB — T4, FREE: Free T4: 1.08 ng/dL (ref 0.60–1.60)

## 2022-12-27 NOTE — Progress Notes (Unsigned)
Name: [name]  MRN/ DOB: [id number], 16-Sep-1985    Age/ Sex: 37 y.o., male    PCP: Genia Del   Reason for Endocrinology Evaluation: MNG     Date of Initial Endocrinology Evaluation: 06/27/2022    HPI: [name] is a 37 y.o. male with a past medical history of MNG, OSA, Dyslipidemia and ADD. The patient presented for initial endocrinology clinic visit on 06/27/2022 for consultative assistance with his MNG.   Patient has been diagnosed with multinodular goiter based on thyroid ultrasound 05/22/2022, this was triggered based on thyromegaly that was noted on physical exam.   No XRT   Denies FH of thyroid disease    Thyroid ultrasound 05/2022 revealed multinodular goiter, he is s/p benign FNA of the left 2.6 cm nodule, FNA of right nodule revealed follicular lesion of undetermined significance (Bethesda category III Patient underwent total thyroidectomy 08/2022 with pathology report consistent with NIFTP, 5.5  cm, involving inferior right pole.   SUBJECTIVE:    Today (12/27/22):  [name] is here for a follow up for postoperative hypothyroidism   Pt has been noted with weight gain  He has recovered well  Denies constipation or diarrhea  Has occasional palpitations  Denies tremors   Levothyroxine 150 mcg daily   HISTORY:  Past Medical History:  Past Medical History:  Diagnosis Date   ADD (attention deficit disorder)    Depression    Gastritis 03/2015   EGD Dr. Karolee Ohs   GERD (gastroesophageal reflux disease)    H/O echocardiogram 10/2007   normal stress echo, Dr. Charlton Haws   Learning disability    Obesity    Psychotic disorder with hallucinations (HCC)    Sleep apnea    Thyroid nodule    Tinea corporis    Wears glasses    Past Surgical History:  Past Surgical History:  Procedure Laterality Date   ESOPHAGOGASTRODUODENOSCOPY  03/2015   gastritis; Dr. Karolee Ohs   THYROIDECTOMY Bilateral 09/12/2022   Procedure:  TOTAL THYROIDECTOMY;  Surgeon: Axel Filler, MD;  Location: [facility] OR;  Service: General;  Laterality: Bilateral;  RNFA    Social History:  reports that he has never smoked. He has never used smokeless tobacco. He reports that he does not drink alcohol and does not use drugs. Family History: family history includes Cancer in his paternal grandmother; Cancer (age of onset: 94) in his father; Deep vein thrombosis in his mother; Diabetes in his paternal aunt; Heart disease in his paternal uncle; Hypertension in his mother; Multiple sclerosis in his father; Prostate cancer in his paternal grandfather.   HOME MEDICATIONS: Allergies as of 12/27/2022       Reactions   Grass Extracts [gramineae Pollens] Swelling        Medication List        Accurate as of December 27, 2022 12:05 PM. If you have any questions, ask your nurse or doctor.          albuterol 108 (90 Base) MCG/ACT inhaler Commonly known as: VENTOLIN HFA Inhale 2 puffs into the lungs every 6 (six) hours as needed for wheezing or shortness of breath.   amphetamine-dextroamphetamine 30 MG tablet Commonly known as: Adderall Take 1 tablet by mouth daily.   calcium carbonate 500 MG chewable tablet Commonly known as: Tums Chew 1 tablet (200 mg of elemental calcium total) by mouth daily.   famotidine 40 MG tablet Commonly known as: PEPCID Take 1 tablet (40 mg total) by  mouth at bedtime.   hydrocortisone 2.5 % lotion Apply 1 application  topically daily as needed (Darkness on face).   ketoconazole 2 % shampoo Commonly known as: NIZORAL Apply topically 2 (two) times a week.   levothyroxine 150 MCG tablet Commonly known as: Synthroid Take 1 tablet (150 mcg total) by mouth daily.   multivitamin with minerals tablet Take 1 tablet by mouth daily. Vita fusion   omeprazole 40 MG capsule Commonly known as: PRILOSEC Take 40 mg by mouth daily.   oxyCODONE 5 MG immediate release tablet Commonly known as: Oxy IR/ROXICODONE Take  1-2 tablets (5-10 mg total) by mouth every 4 (four) hours as needed for moderate pain.   rosuvastatin 10 MG tablet Commonly known as: Crestor Take 1 tablet (10 mg total) by mouth daily.   Selenium Sulfide 2.25 % Sham Apply 1 Application topically every other day.          REVIEW OF SYSTEMS: A comprehensive ROS was conducted with the patient and is negative except as per HPI     OBJECTIVE:  VS: BP (!) 140/90   Pulse 72   Ht 6\' 2"  (1.88 m)   Wt 260 lb 6.4 oz (118.1 kg)   SpO2 98%   BMI 33.43 kg/m    Wt Readings from Last 3 Encounters:  12/27/22 260 lb 6.4 oz (118.1 kg)  09/12/22 256 lb (116.1 kg)  09/04/22 253 lb (114.8 kg)     EXAM: General: Pt appears well and is in NAD  Eyes: External eye exam normal without stare, lid lag or exophthalmos.  EOM intact.    Neck: General: Supple without adenopathy. Thyroid: No goiter or nodules appreciated.   Lungs: Clear with good BS  Heart: Auscultation: RRR.  Abdomen: Normoactive bowel sounds, soft, nontender, without masses or organomegaly palpable  Extremities:  BL LE: No pretibial edema normal ROM and strength.  Mental Status: Judgment, insight: Intact Orientation: Oriented to time, place, and person Mood and affect: No depression, anxiety, or agitation     DATA REVIEWED:    Latest Reference Range & Units 05/09/22 10:59  TSH 0.450 - 4.500 uIU/mL 0.838  T4,Free(Direct) 0.82 - 1.77 ng/dL 6.38      FNA right nodule 07/16/2022   Clinical History: Right mid thyroid nodule 4.6 cm Specimen Submitted:  A. THYROID, RIGHT, FINE NEEDLE ASPIRATION:   FINAL MICROSCOPIC DIAGNOSIS: - Follicular lesion of undetermined significance (Bethesda category III)    Afirma suspicious     Thyroid Pathology report 09/12/2022  FINAL MICROSCOPIC DIAGNOSIS:   A. 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 NIFTP:     Follow-up in 6 months Signed electronically by: Lyndle Herrlich, MD  [facility]  [facility] [address]ure[address] [place], [place] [address] Phone: [phone] FAX: [phone]   CC: Jac Canavan, PA-C [address] [place] [address] Phone: [phone] Fax: [phone]   Return to Endocrinology clinic as below: Future Appointments  Date Time Provider Department Center  12/27/2022 12:10 PM Emelynn Rance, Konrad Dolores, MD [facility] None  02/05/2023  3:00 PM Unk Lightning, PA [facility]  02/11/2023  1:30 PM Lomax, Amy,  NP [facility] None  06/19/2023  2:30 PM Tysinger, Kermit Balo, PA-C [facility]

## 2022-12-27 NOTE — Patient Instructions (Signed)

## 2022-12-30 MED ORDER — LEVOTHYROXINE SODIUM 137 MCG PO TABS: 137.0000 ug | ORAL_TABLET | Freq: Every day | ORAL | 3 refills | Status: DC

## 2023-02-05 ENCOUNTER — Encounter: Payer: Self-pay | Admitting: Physician Assistant

## 2023-02-05 ENCOUNTER — Other Ambulatory Visit: Payer: Self-pay | Admitting: Medical

## 2023-02-05 ENCOUNTER — Ambulatory Visit (INDEPENDENT_AMBULATORY_CARE_PROVIDER_SITE_OTHER): Payer: MEDICAID | Admitting: Physician Assistant

## 2023-02-05 DIAGNOSIS — K219 Gastro-esophageal reflux disease without esophagitis: Secondary | ICD-10-CM

## 2023-02-05 MED ORDER — OMEPRAZOLE 40 MG PO CPDR: 40.0000 mg | DELAYED_RELEASE_CAPSULE | Freq: Every day | ORAL | 3 refills | Status: AC

## 2023-02-05 MED ORDER — FAMOTIDINE 40 MG PO TABS
40.0000 mg | ORAL_TABLET | Freq: Every day | ORAL | 3 refills | Status: AC
Start: 2023-02-05 — End: ?

## 2023-02-05 NOTE — Progress Notes (Signed)
Chief Complaint: Dysphagia follow-up  HPI:    [name] is a 37 year old African-American male with a past medical history as listed below including reflux, known to Dr. Russella Dar, who presents to clinic today for follow-up of his dysphagia.    09/28/2021 patient seen in clinic by Alcide Evener, NP for difficulty swallowing.  At that time continue Omeprazole 40 mg daily and Famotidine 1 tab nightly.  Schedule patient for an EGD.  Also recommended MiraLAX for constipation.    11/08/2021 EGD with LA grade B reflux esophagitis, esophagus was empirically dilated, gastritis.  Recommendations to change omeprazole to 40 mg p.o. twice daily and Famotidine to 40 mg p.o. at bedtime.  Biopsy showed mild chronic inactive gastritis.    12/20/2021 patient seen in clinic and was doing perfectly well.  He continued on Omeprazole 40 twice daily and Famotidine nightly.  That time a little bit of a lingering globus sensation but improving per the patient.  Continued him on Omeprazole 40 twice daily and Famotidine nightly.    Today, patient presents to clinic and tells me that earlier this year he had his thyroid out and is since been put on Levothyroxine, he is having a little bit of difficulty time of this medication around his other medicines.  Due to that he is really stopped taking his GI meds and would like a refill given some increase in epigastric pain since he has not been taking them.  Previously on Omeprazole 40 mg only daily and Famotidine 40 mg nightly.    Nuys fever, chills or weight loss.  Past Medical History:  Diagnosis Date   ADD (attention deficit disorder)    Depression    Gastritis 03/2015   EGD Dr. Karolee Ohs   GERD (gastroesophageal reflux disease)    H/O echocardiogram 10/2007   normal stress echo, Dr. Charlton Haws   Learning disability    Obesity    Psychotic disorder with hallucinations (HCC)    Sleep apnea    Thyroid nodule    Tinea corporis    Wears glasses     Past  Surgical History:  Procedure Laterality Date   ESOPHAGOGASTRODUODENOSCOPY  03/2015   gastritis; Dr. Karolee Ohs   THYROIDECTOMY Bilateral 09/12/2022   Procedure: TOTAL THYROIDECTOMY;  Surgeon: Axel Filler, MD;  Location: [facility] OR;  Service: General;  Laterality: Bilateral;  RNFA    Current Outpatient Medications  Medication Sig Dispense Refill   albuterol (VENTOLIN HFA) 108 (90 Base) MCG/ACT inhaler Inhale 2 puffs into the lungs every 6 (six) hours as needed for wheezing or shortness of breath. (Patient not taking: Reported on 08/08/2022) 8 g 0   amphetamine-dextroamphetamine (ADDERALL) 30 MG tablet Take 1 tablet by mouth daily. 60 tablet 0   calcium carbonate (TUMS) 500 MG chewable tablet Chew 1 tablet (200 mg of elemental calcium total) by mouth daily. 90 tablet 3   famotidine (PEPCID) 40 MG tablet Take 1 tablet (40 mg total) by mouth at bedtime. 90 tablet 1   hydrocortisone 2.5 % lotion Apply 1 application  topically daily as needed (Darkness on face).     ketoconazole (NIZORAL) 2 % shampoo Apply topically 2 (two) times a week. 120 mL 2   levothyroxine (SYNTHROID) 137 MCG tablet Take 1 tablet (137 mcg total) by mouth daily before breakfast. 90 tablet 3   Multiple Vitamins-Minerals (MULTIVITAMIN WITH MINERALS) tablet Take 1 tablet by mouth daily. Vita fusion     omeprazole (PRILOSEC) 40 MG capsule Take 40 mg by mouth  daily.     oxyCODONE (OXY IR/ROXICODONE) 5 MG immediate release tablet Take 1-2 tablets (5-10 mg total) by mouth every 4 (four) hours as needed for moderate pain. (Patient not taking: Reported on 12/27/2022) 30 tablet 0   rosuvastatin (CRESTOR) 10 MG tablet Take 1 tablet (10 mg total) by mouth daily. (Patient not taking: Reported on 12/27/2022) 90 tablet 0   Selenium Sulfide 2.25 % SHAM Apply 1 Application topically every other day.     No current facility-administered medications for this visit.    Allergies as of 02/05/2023 - Review Complete 12/27/2022  Allergen Reaction  Noted   Grass extracts [gramineae pollens] Swelling 12/29/2017    Family History  Problem Relation Age of Onset   Hypertension Mother    Deep vein thrombosis Mother    Cancer Father 46       father   Multiple sclerosis Father    Cancer Paternal Grandmother        uterine   Prostate cancer Paternal Grandfather    Diabetes Paternal Aunt    Heart disease Paternal Uncle    Stroke Neg Hx    Colon cancer Neg Hx    Stomach cancer Neg Hx    Esophageal cancer Neg Hx     Social History   Socioeconomic History   Marital status: Single    Spouse name: Not on file   Number of children: 0   Years of education: Not on file   Highest education level: Associate degree: academic program  Occupational History    Comment: Scientist, physiological  Tobacco Use   Smoking status: Never   Smokeless tobacco: Never  Vaping Use   Vaping status: Never Used  Substance and Sexual Activity   Alcohol use: No   Drug use: No   Sexual activity: Yes    Partners: Female  Other Topics Concern   Not on file  Social History Narrative   Lives with father.   Has degree from [employer] for [employer], does modeling and acting on the side.   Exercise- some weights, walking.  05/2021   Social Determinants of Health   Financial Resource Strain: Not on file  Food Insecurity: Patient Declined (09/12/2022)   Hunger Vital Sign    Worried About Running Out of Food in the Last Year: Patient declined    Ran Out of Food in the Last Year: Patient declined  Transportation Needs: Unknown (09/12/2022)   PRAPARE - Administrator, Civil Service (Medical): No    Lack of Transportation (Non-Medical): Patient declined  Physical Activity: Not on file  Stress: Not on file  Social Connections: Not on file  Intimate Partner Violence: Patient Declined (09/12/2022)   Humiliation, Afraid, Rape, and Kick questionnaire    Fear of Current or Ex-Partner: Patient declined    Emotionally Abused: Patient declined    Physically Abused:  Patient declined    Sexually Abused: Patient declined    Review of Systems:    Constitutional: No weight loss, fever or chills Cardiovascular: No chest pain Respiratory: No SOB  Gastrointestinal: See HPI and otherwise negative   Physical Exam:  Vital signs: BP 130/88   Pulse 73   Ht 6\' 2"  (1.88 m)   Wt 262 lb 3.2 oz (118.9 kg)   BMI 33.66 kg/m    Constitutional:   Pleasant overweight AA male appears to be in NAD, Well developed, Well nourished, alert and cooperative Respiratory: Respirations even and unlabored. Lungs clear to auscultation bilaterally.   No  wheezes, crackles, or rhonchi.  Cardiovascular: Normal S1, S2. No MRG. Regular rate and rhythm. No peripheral edema, cyanosis or pallor.  Gastrointestinal:  Soft, nondistended, nontender. No rebound or guarding. Normal bowel sounds. No appreciable masses or hepatomegaly. Rectal:  Not performed.  Psychiatric: Oriented to person, place and time. Demonstrates good judgement and reason without abnormal affect or behaviors.  RELEVANT LABS AND IMAGING: CBC    Component Value Date/Time   WBC 5.8 09/04/2022 1130   RBC 5.92 (H) 09/04/2022 1130   HGB 16.3 09/04/2022 1130   HGB 16.8 06/17/2022 1415   HCT 50.7 09/04/2022 1130   HCT 49.6 06/17/2022 1415   PLT 320 09/04/2022 1130   PLT 306 06/17/2022 1415   MCV 85.6 09/04/2022 1130   MCV 81 06/17/2022 1415   MCH 27.5 09/04/2022 1130   MCHC 32.1 09/04/2022 1130   RDW 13.8 09/04/2022 1130   RDW 13.1 06/17/2022 1415   LYMPHSABS 1.9 06/17/2022 1415   MONOABS 0.8 07/14/2019 0034   EOSABS 0.0 06/17/2022 1415   BASOSABS 0.0 06/17/2022 1415    CMP     Component Value Date/Time   NA 135 09/13/2022 0358   NA 140 06/17/2022 1415   K 4.3 09/13/2022 0358   CL 100 09/13/2022 0358   CO2 25 09/13/2022 0358   GLUCOSE 122 (H) 09/13/2022 0358   BUN 9 09/13/2022 0358   BUN 8 06/17/2022 1415   CREATININE 1.09 09/13/2022 0358   CREATININE 1.03 07/21/2015 0001   CALCIUM 9.0 09/13/2022  0358   PROT 7.1 09/13/2022 0358   PROT 7.6 06/17/2022 1415   ALBUMIN 3.9 09/13/2022 0358   ALBUMIN 4.9 06/17/2022 1415   AST 22 09/13/2022 0358   ALT 29 09/13/2022 0358   ALKPHOS 34 (L) 09/13/2022 0358   BILITOT 0.9 09/13/2022 0358   BILITOT 0.4 06/17/2022 1415   GFRNONAA >60 09/13/2022 0358   GFRAA 89 05/08/2020 0900    Assessment: 1.  GERD with esophagitis: New increase in epigastric pain but patient has been off of his medicines over the past few months, previously controlled on Omeprazole 40 daily and Famotidine 40 mg nightly  Plan: 1.  Refilled Omeprazole 40 mg daily, 30-60 minutes before breakfast.  #90 with 3 refills 2.  Refilled Famotidine 40 mg nightly #90 with 3 refills 3.  Discussed with patient that typically his Levothyroxine needs to be taken an hour before eating and other medicines but he can clarify timing with the pharmacist when he picks up his meds. 4.  Patient to follow in clinic in a year or sooner  if needed.  Hyacinth Meeker, PA-C [facility] 02/05/2023, 2:54 PM  Cc: Jac Canavan, PA-C

## 2023-02-05 NOTE — Patient Instructions (Signed)
Follow up in 1 year.  We have sent the following medications to your pharmacy for you to pick up at your convenience: Omeprazole 40 mg  Due to recent changes in healthcare laws, you may see the results of your imaging and laboratory studies on MyChart before your provider has had a chance to review them.  We understand that in some cases there may be results that are confusing or concerning to you. Not all laboratory results come back in the same time frame and the provider may be waiting for multiple results in order to interpret others.  Please give [place] 48 hours in order for your provider to thoroughly review all the results before contacting the office for clarification of your results.   Thank you for trusting me with your gastrointestinal care!

## 2023-02-10 ENCOUNTER — Telehealth: Payer: Self-pay

## 2023-02-10 NOTE — Telephone Encounter (Signed)
LVM for pt to bring his CPAP Machine for his appointment on tomorrow 02/11/2023.   Please see Mychart message from today 02/10/2023

## 2023-02-11 ENCOUNTER — Ambulatory Visit: Payer: MEDICAID | Admitting: Family Medicine

## 2023-02-11 ENCOUNTER — Encounter: Payer: Self-pay | Admitting: Family Medicine

## 2023-02-11 VITALS — BP 133/98 | HR 83 | Ht 74.0 in | Wt 264.0 lb

## 2023-02-11 DIAGNOSIS — G4733 Obstructive sleep apnea (adult) (pediatric): Secondary | ICD-10-CM | POA: Diagnosis not present

## 2023-02-11 NOTE — Progress Notes (Signed)
PATIENT: [name] DOB: [birth year]  REASON FOR VISIT: follow up HISTORY FROM: patient  Chief Complaint  Patient presents with   Room 2    Pt is here Alone. Pt states that he had surgery on his thyroid and haven't been able to use his machine.      HISTORY OF PRESENT ILLNESS:  02/11/23 ALL:  Ad returns for follow up for OSA on CPAP. He was last seen 07/2022 and not able to use CPAP due to large thyroid goiter. He underwent thyroidectomy 08/2022. Since, he reports intermittent usage. He reports resuming usage in June and July but recently stopped due to feeling that his throat was sore. He was having trouble with an air leak as well. He is using a nasal mask. He has not called DME. He denies concerns with machine.     08/08/2022 ALL: Calixto returns for follow up for OSA on CPAP. He was last seen 01/2022 and having difficulty meeting 4 hour compliance. Daily compliance was at 77%. Since, he reports having more difficulty with trouble swallowing and a cough. He was diagnosed with a thyroid goiter felt to be causing compressive symptoms. He is planning to have a total thyroidectomy soon. He feels that CPAP has made coughing worse. He continues to try to use it from time to time.     02/06/2022 ALL: Khyden returns for follow up for OSA on CPAP. He was last seen 07/2021 and had stopped therapy following a nose bleed. He has resumed CPAP. He admits that he may use CPAP for the first few hours of sleep then take his mask off. He denies difficulty tolerating mask. He does endorse feeling less sleepy when he uses CPAP.     08/06/2021 ALL: Dru returns for follow up for OSA on CPAP. He continues to work on meeting compliance with therapy. He has not used CPAP since 07/18/2021 due to having a nose bleed. He was afraid CPAP was contributing and has been hesitant to restart. No further episodes of nasal bleeding. He was seen by PCP. He does report concerns of seasonal allergies.        03/20/2021 ALL:  ABUBAKARR BROXTERMAN is a 37 y.o. male here today for follow up for OSA on CPAP. He was seen by Dr Dohmeier in 05/2020 for difficulty initiating and maintaining sleep. HST 06/2020 showed severe OSA with total AHI of 31.9/hr. AutoPAP ordered and set up 12/2020. Since, he is doing well. He is adjusting to CPAP therapy. He admits that he doesn't always use therapy but does report feeling better when he does.     HISTORY: (copied from Dr Dohmeier's previous note)  [name] is a 37 y.o. year old  African- American male patient of Dr Richrd Humbles and referred for a sleep consultation by PA Tysinger. seen  on 06/27/2020. Chief concern according to patient : " I am not able to sleep, I am always looking around, and easily woken up- I have a hard time with concentration , easily distracted. I am falling asleep when the sun rises and sleep into the afternoon".   [name] is a left-handed [place] American male with chronic insomnia, delayed sleep phase syndrome, sleeping in daytime . He  has a past medical history of ADHD (attention deficit disorder with hyperactivity), Anxiety, Depression, Gastritis (03/2015), Learning disability, Obesity,  Insomnia,  Psychotic disorder with hallucinations (HCC), Tinea corporis, and Wears glasses.   The patient had the first sleep study in the  year 2017  with no sleep time recorded. Advise was given to addre delayed sleep phase syndrome and address with psychology.    Sleep relevant medical history:  No ENT surgery, no Tonsillectomy.   Family medical /sleep history: no other family member on CPAP with OSA, insomnia, not aware of any psychiatric illness.  Social history:  Patient is working as a Buyer, retail, Physiological scientist- looking for work. Unemployed since 04-2018. He lives in a household alone.  Tobacco use- never .  ETOH use ; none , Caffeine intake in form of Soda( 1 a day)  Regular exercise ; weight lifting. Hobbies  :cooking.    Sleep habits are as follows: The patient's dinner time is between 11 PM. The patient takes a hot shower, goes to bed at 12 PM and is awake - for hours, once asleep he  continues to sleep for 5-6  hours, wakes rarely for  bathroom breaks.  He has racing thoughts, incoherent thoughts. Watches T in bed, or reads.  The preferred sleep position is supine , with the support of 1 pillow.  Dreams are reportedly rare.  3-4  PM is the usual rise time. The patient wakes up spontaneously.  He reports not feeling refreshed or restored from sleep, with symptoms such as dry mouth, morning headaches, and residual fatigue.  Naps are taken infrequent.    REVIEW OF SYSTEMS: Out of a complete 14 system review of symptoms, the patient complains only of the following symptoms, nose bleed, seasonal allergies  and all other reviewed systems are negative.  ESS:  10/24, previously 5/24  ALLERGIES: Allergies  Allergen Reactions   Grass Extracts [Gramineae Pollens] Swelling    HOME MEDICATIONS: Outpatient Medications Prior to Visit  Medication Sig Dispense Refill   amphetamine-dextroamphetamine (ADDERALL) 30 MG tablet Take 1 tablet by mouth daily. 60 tablet 0   calcium carbonate (TUMS) 500 MG chewable tablet Chew 1 tablet (200 mg of elemental calcium total) by mouth daily. 90 tablet 3   famotidine (PEPCID) 40 MG tablet Take 1 tablet (40 mg total) by mouth at bedtime. 90 tablet 3   hydrocortisone 2.5 % lotion Apply 1 application  topically daily as needed (Darkness on face).     ketoconazole (NIZORAL) 2 % shampoo APPLY TOPICALLY 2 TIMES A WEEK 120 mL 2   levothyroxine (SYNTHROID) 137 MCG tablet Take 1 tablet (137 mcg total) by mouth daily before breakfast. 90 tablet 3   Multiple Vitamins-Minerals (MULTIVITAMIN WITH MINERALS) tablet Take 1 tablet by mouth daily. Vita fusion     omeprazole (PRILOSEC) 40 MG capsule Take 1 capsule (40 mg total) by mouth daily. 90 capsule 3   rosuvastatin (CRESTOR) 10 MG  tablet Take 1 tablet (10 mg total) by mouth daily. 90 tablet 0   Selenium Sulfide 2.25 % SHAM Apply 1 Application topically every other day.     albuterol (VENTOLIN HFA) 108 (90 Base) MCG/ACT inhaler Inhale 2 puffs into the lungs every 6 (six) hours as needed for wheezing or shortness of breath. (Patient not taking: Reported on 02/11/2023) 8 g 0   oxyCODONE (OXY IR/ROXICODONE) 5 MG immediate release tablet Take 1-2 tablets (5-10 mg total) by mouth every 4 (four) hours as needed for moderate pain. (Patient not taking: Reported on 02/11/2023) 30 tablet 0   No facility-administered medications prior to visit.    PAST MEDICAL HISTORY: Past Medical History:  Diagnosis Date   ADD (attention deficit disorder)    Depression    Gastritis 03/2015  EGD Dr. Karolee Ohs   GERD (gastroesophageal reflux disease)    H/O echocardiogram 10/2007   normal stress echo, Dr. Charlton Haws   Learning disability    Obesity    Psychotic disorder with hallucinations (HCC)    Sleep apnea    Thyroid nodule    Tinea corporis    Wears glasses     PAST SURGICAL HISTORY: Past Surgical History:  Procedure Laterality Date   ESOPHAGOGASTRODUODENOSCOPY  03/2015   gastritis; Dr. Karolee Ohs   THYROIDECTOMY Bilateral 09/12/2022   Procedure: TOTAL THYROIDECTOMY;  Surgeon: Axel Filler, MD;  Location: [facility] OR;  Service: General;  Laterality: Bilateral;  RNFA    FAMILY HISTORY: Family History  Problem Relation Age of Onset   Hypertension Mother    Deep vein thrombosis Mother    Cancer Father 31       father   Multiple sclerosis Father    Cancer Paternal Grandmother        uterine   Prostate cancer Paternal Grandfather    Diabetes Paternal Aunt    Heart disease Paternal Uncle    Stroke Neg Hx    Colon cancer Neg Hx    Stomach cancer Neg Hx    Esophageal cancer Neg Hx     SOCIAL HISTORY: Social History   Socioeconomic History   Marital status: Single    Spouse name: Not on file   Number of  children: 0   Years of education: Not on file   Highest education level: Associate degree: academic program  Occupational History    Comment: Engineer, maintenance (IT) arts  Tobacco Use   Smoking status: Never   Smokeless tobacco: Never  Vaping Use   Vaping status: Never Used  Substance and Sexual Activity   Alcohol use: No   Drug use: No   Sexual activity: Yes    Partners: Female  Other Topics Concern   Not on file  Social History Narrative   Lives with father.   Has degree from [employer] for [employer], does modeling and acting on the side.   Exercise- some weights, walking.  05/2021   Social Determinants of Health   Financial Resource Strain: Not on file  Food Insecurity: Patient Declined (09/12/2022)   Hunger Vital Sign    Worried About Running Out of Food in the Last Year: Patient declined    Ran Out of Food in the Last Year: Patient declined  Transportation Needs: Unknown (09/12/2022)   PRAPARE - Administrator, Civil Service (Medical): No    Lack of Transportation (Non-Medical): Patient declined  Physical Activity: Not on file  Stress: Not on file  Social Connections: Not on file  Intimate Partner Violence: Patient Declined (09/12/2022)   Humiliation, Afraid, Rape, and Kick questionnaire    Fear of Current or Ex-Partner: Patient declined    Emotionally Abused: Patient declined    Physically Abused: Patient declined    Sexually Abused: Patient declined     PHYSICAL EXAM  Vitals:   02/11/23 1324  BP: (!) 133/98  Pulse: 83  Weight: 264 lb (119.7 kg)  Height: 6\' 2"  (1.88 m)       Body mass index is 33.9 kg/m.  Generalized: Well developed, in no acute distress  Cardiology: normal rate and rhythm, no murmur noted Respiratory: clear to auscultation bilaterally  Neurological examination  Mentation: Alert oriented to time, place, history taking. Follows all commands speech and language fluent Cranial nerve II-XII: Pupils were equal round reactive to light.  Extraocular  movements were full, visual field were full  Motor: The motor testing reveals 5 over 5 strength of all 4 extremities. Good symmetric motor tone is noted throughout.  Gait and station: Gait is normal.    DIAGNOSTIC DATA (LABS, IMAGING, TESTING) - I reviewed patient records, labs, notes, testing and imaging myself where available.      No data to display           Lab Results  Component Value Date   WBC 5.8 09/04/2022   HGB 16.3 09/04/2022   HCT 50.7 09/04/2022   MCV 85.6 09/04/2022   PLT 320 09/04/2022      Component Value Date/Time   NA 135 09/13/2022 0358   NA 140 06/17/2022 1415   K 4.3 09/13/2022 0358   CL 100 09/13/2022 0358   CO2 25 09/13/2022 0358   GLUCOSE 122 (H) 09/13/2022 0358   BUN 9 09/13/2022 0358   BUN 8 06/17/2022 1415   CREATININE 1.09 09/13/2022 0358   CREATININE 1.03 07/21/2015 0001   CALCIUM 9.0 09/13/2022 0358   PROT 7.1 09/13/2022 0358   PROT 7.6 06/17/2022 1415   ALBUMIN 3.9 09/13/2022 0358   ALBUMIN 4.9 06/17/2022 1415   AST 22 09/13/2022 0358   ALT 29 09/13/2022 0358   ALKPHOS 34 (L) 09/13/2022 0358   BILITOT 0.9 09/13/2022 0358   BILITOT 0.4 06/17/2022 1415   GFRNONAA >60 09/13/2022 0358   GFRAA 89 05/08/2020 0900   Lab Results  Component Value Date   CHOL 215 (H) 06/17/2022   HDL 51 06/17/2022   LDLCALC 145 (H) 06/17/2022   TRIG 105 06/17/2022   CHOLHDL 4.2 06/17/2022   Lab Results  Component Value Date   HGBA1C 5.4 06/17/2022   No results found for: "VITAMINB12" Lab Results  Component Value Date   TSH 0.21 (L) 12/27/2022     ASSESSMENT AND PLAN 37 y.o. year old male  has a past medical history of ADD (attention deficit disorder), Depression, Gastritis (03/2015), GERD (gastroesophageal reflux disease), H/O echocardiogram (10/2007), Learning disability, Obesity, Psychotic disorder with hallucinations (HCC), Sleep apnea, Thyroid nodule, Tinea corporis, and Wears glasses. here with     ICD-10-CM   1. OSA on CPAP   G47.33 For home use only DME continuous positive airway pressure (CPAP)      MARCIN MCELHENNEY was previously doing fairly well on CPAP. He does report feeling better when he uses CPAP. Compliance report for past 90 days reveals sub optimal usage. He was encouraged to continue using CPAP nightly and for greater than 4 hours each night. We will update supply orders as indicated. Risks of untreated sleep apnea review and education materials provided. Healthy lifestyle habits encouraged. He will follow up in 6 months once he has resumed CPAP usage, sooner if needed. He verbalizes understanding and agreement with this plan.    Orders Placed This Encounter  Procedures   For home use only DME continuous positive airway pressure (CPAP)    Supplies    Order Specific Question:   Length of Need    Answer:   Lifetime    Order Specific Question:   Patient has OSA or probable OSA    Answer:   Yes    Order Specific Question:   Is the patient currently using CPAP in the home    Answer:   Yes    Order Specific Question:   Settings    Answer:   Other see comments    Order Specific Question:  CPAP supplies needed    Answer:   Mask, headgear, cushions, filters, heated tubing and water chamber      No orders of the defined types were placed in this encounter.      Shawnie Dapper, FNP-C 02/11/2023, 1:36 PM [facility] [address] [place], [place] [address] [phone]

## 2023-02-11 NOTE — Patient Instructions (Addendum)
Please continue using your CPAP regularly. While your insurance requires that you use CPAP at least 4 hours each night on 70% of the nights, I recommend, that you not skip any nights and use it throughout the night if you can. Getting used to CPAP and staying with the treatment long term does take time and patience and discipline. Untreated obstructive sleep apnea when it is moderate to severe can have an adverse impact on cardiovascular health and raise her risk for heart disease, arrhythmias, hypertension, congestive heart failure, stroke and diabetes. Untreated obstructive sleep apnea causes sleep disruption, nonrestorative sleep, and sleep deprivation. This can have an impact on your day to day functioning and cause daytime sleepiness and impairment of cognitive function, memory loss, mood disturbance, and problems focussing. Using CPAP regularly can improve these symptoms.  We will update supply orders, today. Please try to work on improving your compliance.   DME: [facility] Phone: [phone]  Follow up once you are CPAP consistently for at least 30 days, call us and we schedule follow up

## 2023-04-02 ENCOUNTER — Telehealth: Payer: Self-pay

## 2023-04-02 DIAGNOSIS — Z08 Encounter for follow-up examination after completed treatment for malignant neoplasm: Secondary | ICD-10-CM

## 2023-04-02 NOTE — Telephone Encounter (Signed)
done

## 2023-04-02 NOTE — Telephone Encounter (Signed)
Pt. Called stating he need a referral put in the system to his Dermatology office. He has an appointment on Friday to [facility] at [address]

## 2023-04-02 NOTE — Addendum Note (Signed)
Addended by: Herminio Commons A on: 04/02/2023 01:07 PM   Modules accepted: Orders

## 2023-06-19 ENCOUNTER — Encounter: Payer: Medicaid Other | Admitting: Medical

## 2023-06-30 ENCOUNTER — Encounter: Payer: Self-pay | Admitting: Internal Medicine

## 2023-06-30 ENCOUNTER — Ambulatory Visit (INDEPENDENT_AMBULATORY_CARE_PROVIDER_SITE_OTHER): Payer: MEDICAID | Admitting: Internal Medicine

## 2023-06-30 VITALS — BP 130/74 | HR 71 | Ht 74.0 in | Wt 271.0 lb

## 2023-06-30 DIAGNOSIS — E890 Postprocedural hypothyroidism: Secondary | ICD-10-CM | POA: Diagnosis not present

## 2023-06-30 NOTE — Progress Notes (Signed)
Name: [name]  MRN/ DOB: [id number], July 22, 1985    Age/ Sex: 38 y.o., male    PCP: Genia Del   Reason for Endocrinology Evaluation: MNG     Date of Initial Endocrinology Evaluation: 06/27/2022    HPI: [name] is a 38 y.o. male with a past medical history of MNG, OSA, Dyslipidemia and ADD. The patient presented for initial endocrinology clinic visit on 06/27/2022 for consultative assistance with his MNG.   Patient has been diagnosed with multinodular goiter based on thyroid ultrasound 05/22/2022, this was triggered based on thyromegaly that was noted on physical exam.   No XRT   Denies FH of thyroid disease    Thyroid ultrasound 05/2022 revealed multinodular goiter, he is s/p benign FNA of the left 2.6 cm nodule, FNA of right nodule revealed follicular lesion of undetermined significance (Bethesda category III Patient underwent total thyroidectomy 08/2022 with pathology report consistent with NIFTP, 5.5  cm, involving inferior right pole.   SUBJECTIVE:    Today (06/30/23):  [name] is here for a follow up for postoperative hypothyroidism   Patient continues with weight gain No local neck symptoms but has occasional hoarseness Has rare palpitations Denies tremors  Has noted occasional changes in bowel movements   Levothyroxine 137 mcg daily   HISTORY:  Past Medical History:  Past Medical History:  Diagnosis Date   ADD (attention deficit disorder)    Depression    Gastritis 03/2015   EGD Dr. Karolee Ohs   GERD (gastroesophageal reflux disease)    H/O echocardiogram 10/2007   normal stress echo, Dr. Charlton Haws   Learning disability    Obesity    Psychotic disorder with hallucinations (HCC)    Sleep apnea    Thyroid nodule    Tinea corporis    Wears glasses    Past Surgical History:  Past Surgical History:  Procedure Laterality Date   ESOPHAGOGASTRODUODENOSCOPY  03/2015   gastritis; Dr. Karolee Ohs   THYROIDECTOMY  Bilateral 09/12/2022   Procedure: TOTAL THYROIDECTOMY;  Surgeon: Axel Filler, MD;  Location: [facility] OR;  Service: General;  Laterality: Bilateral;  RNFA    Social History:  reports that he has never smoked. He has never used smokeless tobacco. He reports that he does not drink alcohol and does not use drugs. Family History: family history includes Cancer in his paternal grandmother; Cancer (age of onset: 47) in his father; Deep vein thrombosis in his mother; Diabetes in his paternal aunt; Heart disease in his paternal uncle; Hypertension in his mother; Multiple sclerosis in his father; Prostate cancer in his paternal grandfather.   HOME MEDICATIONS: Allergies as of 06/30/2023       Reactions   Grass Extracts [gramineae Pollens] Swelling        Medication List        Accurate as of June 30, 2023  1:13 PM. If you have any questions, ask your nurse or doctor.          albuterol 108 (90 Base) MCG/ACT inhaler Commonly known as: VENTOLIN HFA Inhale 2 puffs into the lungs every 6 (six) hours as needed for wheezing or shortness of breath.   amphetamine-dextroamphetamine 30 MG tablet Commonly known as: Adderall Take 1 tablet by mouth daily.   calcium carbonate 500 MG chewable tablet Commonly known as: Tums Chew 1 tablet (200 mg of elemental calcium total) by mouth daily.   famotidine 40 MG tablet Commonly known as: PEPCID Take 1 tablet (40  mg total) by mouth at bedtime.   hydrocortisone 2.5 % lotion Apply 1 application  topically daily as needed (Darkness on face).   ketoconazole 2 % shampoo Commonly known as: NIZORAL APPLY TOPICALLY 2 TIMES A WEEK   levothyroxine 137 MCG tablet Commonly known as: SYNTHROID Take 1 tablet (137 mcg total) by mouth daily before breakfast.   multivitamin with minerals tablet Take 1 tablet by mouth daily. Vita fusion   omeprazole 40 MG capsule Commonly known as: PRILOSEC Take 1 capsule (40 mg total) by mouth daily.   oxyCODONE 5 MG  immediate release tablet Commonly known as: Oxy IR/ROXICODONE Take 1-2 tablets (5-10 mg total) by mouth every 4 (four) hours as needed for moderate pain.   rosuvastatin 10 MG tablet Commonly known as: Crestor Take 1 tablet (10 mg total) by mouth daily.   Selenium Sulfide 2.25 % Sham Apply 1 Application topically every other day.   Sodium Fluoride 5000 Enamel 1.1-5 % Gel Generic drug: Sod Fluoride-Potassium Nitrate See admin instructions.          REVIEW OF SYSTEMS: A comprehensive ROS was conducted with the patient and is negative except as per HPI     OBJECTIVE:  VS: BP 130/74 (BP Location: Left Arm, Patient Position: Sitting, Cuff Size: Normal)   Pulse 71   Ht 6\' 2"  (1.88 m)   Wt 271 lb (122.9 kg)   SpO2 97%   BMI 34.79 kg/m    Wt Readings from Last 3 Encounters:  06/30/23 271 lb (122.9 kg)  02/11/23 264 lb (119.7 kg)  02/05/23 262 lb 3.2 oz (118.9 kg)     EXAM: General: Pt appears well and is in NAD  Neck: General: Supple without adenopathy. Thyroid: No goiter or nodules appreciated.   Lungs: Clear with good BS  Heart: Auscultation: RRR.  Abdomen: soft, nontender  Extremities:  BL LE: No pretibial edema  Mental Status: Judgment, insight: Intact Orientation: Oriented to time, place, and person Mood and affect: No depression, anxiety, or agitation     DATA REVIEWED:     Latest Reference Range & Units 06/30/23 13:32  TSH 0.40 - 4.50 mIU/L 2.32  T4,Free(Direct) 0.8 - 1.8 ng/dL 1.7     FNA right nodule 07/16/2022   Clinical History: Right mid thyroid nodule 4.6 cm Specimen Submitted:  A. THYROID, RIGHT, FINE NEEDLE ASPIRATION:   FINAL MICROSCOPIC DIAGNOSIS: - Follicular lesion of undetermined significance (Bethesda category III)    Afirma suspicious     Thyroid Pathology report 09/12/2022  FINAL MICROSCOPIC DIAGNOSIS:   A. 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 - Pt encouraged to double dose the following day if she were to miss a dose given long half-life of levothyroxine. -TFT's are normal, no change    Medication Continue levothyroxine 137 mcg daily   Follow-up in 6 months     Signed electronically by: Lyndle Herrlich, MD  [facility]  [facility] [address] [place], [place] [address] Phone:  [phone] FAX: [phone]   CC: Jac Canavan, PA-C [address] [place] [address] Phone: [phone] Fax: [phone]   Return to Endocrinology clinic as below: Future Appointments  Date Time Provider Department Center  07/01/2023  3:15 PM Tysinger, Kermit Balo, PA-C [facility]

## 2023-06-30 NOTE — Patient Instructions (Signed)

## 2023-07-01 ENCOUNTER — Ambulatory Visit: Payer: MEDICAID | Admitting: Medical

## 2023-07-01 ENCOUNTER — Encounter: Payer: Self-pay | Admitting: Medical

## 2023-07-01 ENCOUNTER — Encounter: Payer: Self-pay | Admitting: Internal Medicine

## 2023-07-01 VITALS — BP 110/70 | HR 83 | Ht 74.0 in | Wt 271.0 lb

## 2023-07-01 DIAGNOSIS — Z13220 Encounter for screening for lipoid disorders: Secondary | ICD-10-CM

## 2023-07-01 DIAGNOSIS — Z0000 Encounter for general adult medical examination without abnormal findings: Secondary | ICD-10-CM

## 2023-07-01 DIAGNOSIS — J4520 Mild intermittent asthma, uncomplicated: Secondary | ICD-10-CM | POA: Insufficient documentation

## 2023-07-01 DIAGNOSIS — Z23 Encounter for immunization: Secondary | ICD-10-CM | POA: Diagnosis not present

## 2023-07-01 DIAGNOSIS — Z6834 Body mass index (BMI) 34.0-34.9, adult: Secondary | ICD-10-CM

## 2023-07-01 DIAGNOSIS — E890 Postprocedural hypothyroidism: Secondary | ICD-10-CM

## 2023-07-01 DIAGNOSIS — F411 Generalized anxiety disorder: Secondary | ICD-10-CM | POA: Diagnosis not present

## 2023-07-01 DIAGNOSIS — F909 Attention-deficit hyperactivity disorder, unspecified type: Secondary | ICD-10-CM

## 2023-07-01 DIAGNOSIS — Z131 Encounter for screening for diabetes mellitus: Secondary | ICD-10-CM

## 2023-07-01 LAB — TSH: TSH: 2.32 m[IU]/L (ref 0.40–4.50)

## 2023-07-01 LAB — T4, FREE: Free T4: 1.7 ng/dL (ref 0.8–1.8)

## 2023-07-01 MED ORDER — LEVOTHYROXINE SODIUM 137 MCG PO TABS: 137.0000 ug | ORAL_TABLET | Freq: Every day | ORAL | 3 refills | Status: DC

## 2023-07-01 MED ORDER — HYDROCORTISONE 2.5 % EX LOTN: 1.0000 "application " | TOPICAL_LOTION | Freq: Every day | CUTANEOUS | 1 refills | Status: AC | PRN

## 2023-07-01 MED ORDER — ALBUTEROL SULFATE HFA 108 (90 BASE) MCG/ACT IN AERS: 2.0000 | INHALATION_SPRAY | Freq: Four times a day (QID) | RESPIRATORY_TRACT | 2 refills | Status: AC | PRN

## 2023-07-01 NOTE — Progress Notes (Signed)
Subjective:   HPI  [name] is a 38 y.o. male who presents for Chief Complaint  Patient presents with   Annual Exam    Fasting cpe, back pain    Patient Care Team: Leahann Lempke, Kermit Balo, PA-C as PCP - General (Family Medicine) Shawnie Dapper, NP as Nurse Practitioner (Family Medicine) [facility], Konrad Dolores, MD as Attending Physician (Endocrinology) Sees dentist Sees eye doctor Dermatology in [place], [place] Dr. Porfirio Mylar Dohmeier and Shawnie Dapper, NP, neurology Prior psychiatry with Dr. Anice Paganini   Concerns: Here for well visit  Needs refill on albuterol for prn use. No major asthma concerns  Having upper back pains often.  Walking and standing aggravates his pain. He also has relatively flat feet and feet ache a lot.   Tries to exercise and stretch.   Doesn't have working CPAP.  Had difficulty with prior CPAP, leaking air.  Reviewed their medical, surgical, family, social, medication, and allergy history and updated chart as appropriate.  Past Medical History:  Diagnosis Date   ADD (attention deficit disorder)    Depression    Gastritis 03/2015   EGD Dr. Karolee Ohs   GERD (gastroesophageal reflux disease)    H/O echocardiogram 10/2007   normal stress echo, Dr. Charlton Haws   Learning disability    Obesity    Psychotic disorder with hallucinations (HCC)    Sleep apnea    Thyroid nodule    Tinea corporis    Wears glasses     Past Surgical History:  Procedure Laterality Date   ESOPHAGOGASTRODUODENOSCOPY  03/2015   gastritis; Dr. Karolee Ohs   THYROIDECTOMY Bilateral 09/12/2022   Procedure: TOTAL THYROIDECTOMY;  Surgeon: Axel Filler, MD;  Location: [facility] OR;  Service: General;  Laterality: Bilateral;  RNFA    Family History  Problem Relation Age of Onset   Hypertension Mother    Deep vein thrombosis Mother    Cancer Father 4       father   Multiple sclerosis Father    Cancer Paternal Grandmother        uterine   Prostate cancer Paternal  Grandfather    Diabetes Paternal Aunt    Heart disease Paternal Uncle    Stroke Neg Hx    Colon cancer Neg Hx    Stomach cancer Neg Hx    Esophageal cancer Neg Hx      Current Outpatient Medications:    amphetamine-dextroamphetamine (ADDERALL) 30 MG tablet, Take 1 tablet by mouth daily., Disp: 60 tablet, Rfl: 0   calcium carbonate (TUMS) 500 MG chewable tablet, Chew 1 tablet (200 mg of elemental calcium total) by mouth daily., Disp: 90 tablet, Rfl: 3   famotidine (PEPCID) 40 MG tablet, Take 1 tablet (40 mg total) by mouth at bedtime., Disp: 90 tablet, Rfl: 3   ketoconazole (NIZORAL) 2 % shampoo, APPLY TOPICALLY 2 TIMES A WEEK, Disp: 120 mL, Rfl: 2   levothyroxine (SYNTHROID) 137 MCG tablet, Take 1 tablet (137 mcg total) by mouth daily before breakfast., Disp: 90 tablet, Rfl: 3   Multiple Vitamins-Minerals (MULTIVITAMIN WITH MINERALS) tablet, Take 1 tablet by mouth daily. Vita fusion, Disp: , Rfl:    albuterol (VENTOLIN HFA) 108 (90 Base) MCG/ACT inhaler, Inhale 2 puffs into the lungs every 6 (six) hours as needed for wheezing or shortness of breath., Disp: 8 g, Rfl: 2   hydrocortisone 2.5 % lotion, Apply 1 application  topically daily as needed (Darkness on face)., Disp: 59 mL, Rfl: 1   omeprazole (PRILOSEC) 40 MG capsule,  Take 1 capsule (40 mg total) by mouth daily. (Patient not taking: Reported on 07/01/2023), Disp: 90 capsule, Rfl: 3  Allergies  Allergen Reactions   Grass Extracts [Gramineae Pollens] Swelling   Review of Systems  Constitutional:  Negative for chills, fever, malaise/fatigue and weight loss.  HENT:  Negative for congestion, ear pain, hearing loss, sore throat and tinnitus.   Eyes:  Negative for blurred vision, pain and redness.  Respiratory:  Negative for cough, hemoptysis and shortness of breath.   Cardiovascular:  Negative for chest pain, palpitations, orthopnea, claudication and leg swelling.  Gastrointestinal:  Negative for abdominal pain, blood in stool,  constipation, diarrhea, nausea and vomiting.  Genitourinary:  Negative for dysuria, flank pain, frequency, hematuria and urgency.  Musculoskeletal:  Positive for back pain and myalgias. Negative for falls and joint pain.  Skin:  Negative for itching and rash.  Neurological:  Negative for dizziness, tingling, speech change, weakness and headaches.  Endo/Heme/Allergies:  Negative for polydipsia. Does not bruise/bleed easily.  Psychiatric/Behavioral:  Negative for depression and memory loss. The patient is not nervous/anxious and does not have insomnia.        07/01/2023    3:17 PM 06/17/2022    1:47 PM 09/12/2021   11:03 AM 06/13/2021    2:20 PM 02/14/2021   11:21 AM  Depression screen PHQ 2/9  Decreased Interest 0 0 0 0 0  Down, Depressed, Hopeless 0 0 0 0 0  PHQ - 2 Score 0 0 0 0 0  Altered sleeping   1 0 0  Tired, decreased energy   1 1 1   Change in appetite   0 0 0  Feeling bad or failure about yourself    0 0 0  Trouble concentrating   1 2 0  Moving slowly or fidgety/restless   1 0 0  Suicidal thoughts   0 0 0  PHQ-9 Score   4 3 1   Difficult doing work/chores   Not difficult at all Not difficult at all Not difficult at all        Objective:  BP 110/70   Pulse 83   Ht 6\' 2"  (1.88 m)   Wt 271 lb (122.9 kg)   BMI 34.79 kg/m   Physical Exam Vitals and nursing note reviewed.  Constitutional:      General: He is not in acute distress.    Appearance: Normal appearance. He is not ill-appearing.  HENT:     Head: Normocephalic and atraumatic.     Right Ear: External ear normal.     Left Ear: External ear normal.     Nose: Nose normal.     Mouth/Throat:     Mouth: Mucous membranes are moist.     Pharynx: Oropharynx is clear.  Eyes:     Extraocular Movements: Extraocular movements intact.     Conjunctiva/sclera: Conjunctivae normal.     Pupils: Pupils are equal, round, and reactive to light.  Neck:     Vascular: No carotid bruit.  Cardiovascular:     Rate and Rhythm:  Normal rate and regular rhythm.     Pulses: Normal pulses.     Heart sounds: Normal heart sounds.  Pulmonary:     Effort: Pulmonary effort is normal.     Breath sounds: Normal breath sounds.  Abdominal:     General: Bowel sounds are normal. There is no distension.     Palpations: Abdomen is soft. There is no mass.     Tenderness: There is no abdominal  tenderness.     Hernia: No hernia is present.  Genitourinary:    Penis: Normal.      Testes: Normal.  Musculoskeletal:        General: No swelling, tenderness or deformity. Normal range of motion.     Cervical back: Normal range of motion and neck supple. No tenderness.     Right lower leg: No edema.     Left lower leg: No edema.  Lymphadenopathy:     Cervical: No cervical adenopathy.  Skin:    General: Skin is warm and dry.     Capillary Refill: Capillary refill takes less than 2 seconds.     Findings: Rash present.     Comments: Scattered hypopigmented patches on face  Neurological:     General: No focal deficit present.     Mental Status: He is alert and oriented to person, place, and time. Mental status is at baseline.     Cranial Nerves: No cranial nerve deficit.     Sensory: No sensory deficit.     Motor: No weakness.     Gait: Gait normal.     Deep Tendon Reflexes: Reflexes normal.  Psychiatric:        Mood and Affect: Mood normal.        Behavior: Behavior normal.        Judgment: Judgment normal.       Assessment and Plan :   Encounter Diagnoses  Name Primary?   Routine general medical examination at a health care facility Yes   Generalized anxiety disorder    Attention deficit hyperactivity disorder (ADHD), unspecified ADHD type    Screening for lipid disorders    Screening for diabetes mellitus    S/P thyroidectomy    Need for pneumococcal vaccination    Intermittent asthma without complication, unspecified asthma severity    BMI 34.0-34.9,adult     This visit was a preventative care visit, also known  as wellness visit or routine physical.   Topics typically include healthy lifestyle, diet, exercise, preventative care, vaccinations, sick and well care, proper use of emergency dept and after hours care, as well as other concerns.     Recommendations: Continue to return yearly for your annual wellness and preventative care visits.  This gives [place] a chance to discuss healthy lifestyle, exercise, vaccinations, review your chart record, and perform screenings where appropriate.  I recommend you see your eye doctor yearly for routine vision care.  I recommend you see your dentist yearly for routine dental care including hygiene visits twice yearly.   Vaccination recommendations were reviewed Immunization History  Administered Date(s) Administered   Influenza Whole 04/13/2007   Influenza,inj,Quad PF,6+ Mos 03/22/2013, 07/21/2015, 02/22/2016, 02/23/2019, 03/21/2020, 06/17/2022   PFIZER Comirnaty(Gray Top)Covid-19 Tri-Sucrose Vaccine 10/10/2020, 11/01/2020   Pfizer Covid-19 Vaccine Bivalent Booster 23yrs & up 03/09/2021   Pneumococcal Polysaccharide-23 07/01/2023   Tdap 08/11/2014   Counseled on the pneumococcal vaccine.  Vaccine information sheet given.  Pneumococcal vaccine PPSV23 given after consent obtained.    Screening for cancer: Colon cancer screening: Age 62  Testicular cancer screening You should do a monthly self testicular exam if you are between 35-38 years old  We discussed PSA, prostate exam, and prostate cancer screening risks/benefits.   Age 59  Skin cancer screening: Check your skin regularly for new changes, growing lesions, or other lesions of concern Come in for evaluation if you have skin lesions of concern.  Lung cancer screening: If you have a greater than 20  pack year history of tobacco use, then you may qualify for lung cancer screening with a chest CT scan.   Please call your insurance company to inquire about coverage for this test.  We currently don't  have screenings for other cancers besides breast, cervical, colon, and lung cancers.  If you have a strong family history of cancer or have other cancer screening concerns, please let me know.    Bone health: Get at least 150 minutes of aerobic exercise weekly Get weight bearing exercise at least once weekly Bone density test:  A bone density test is an imaging test that uses a type of X-ray to measure the amount of calcium and other minerals in your bones. The test may be used to diagnose or screen you for a condition that causes weak or thin bones (osteoporosis), predict your risk for a broken bone (fracture), or determine how well your osteoporosis treatment is working. The bone density test is recommended for females 65 and older, or females or males <65 if certain risk factors such as thyroid disease, long term use of steroids such as for asthma or rheumatological issues, vitamin D deficiency, estrogen deficiency, family history of osteoporosis, self or family history of fragility fracture in first degree relative.    Heart health: Get at least 150 minutes of aerobic exercise weekly Limit alcohol It is important to maintain a healthy blood pressure and healthy cholesterol numbers  Heart disease screening: Screening for heart disease includes screening for blood pressure, fasting lipids, glucose/diabetes screening, BMI height to weight ratio, reviewed of smoking status, physical activity, and diet.    Goals include blood pressure 120/80 or less, maintaining a healthy lipid/cholesterol profile, preventing diabetes or keeping diabetes numbers under good control, not smoking or using tobacco products, exercising most days per week or at least 150 minutes per week of exercise, and eating healthy variety of fruits and vegetables, healthy oils, and avoiding unhealthy food choices like fried food, fast food, high sugar and high cholesterol foods.    Other tests may possibly include EKG test, CT  coronary calcium score, echocardiogram, exercise treadmill stress test.     Medical care options: I recommend you continue to seek care here first for routine care.  We try really hard to have available appointments Monday through Friday daytime hours for sick visits, acute visits, and physicals.  Urgent care should be used for after hours and weekends for significant issues that cannot wait till the next day.  The emergency department should be used for significant potentially life-threatening emergencies.  The emergency department is expensive, can often have long wait times for less significant concerns, so try to utilize primary care, urgent care, or telemedicine when possible to avoid unnecessary trips to the emergency department.  Virtual visits and telemedicine have been introduced since the pandemic started in 2020, and can be convenient ways to receive medical care.  We offer virtual appointments as well to assist you in a variety of options to seek medical care.   Separate significant issues discussed: OSA - he had difficulty getting CPAP from prior referral.  Follow up with neurology  Thyroid nodules -  on medication, s/p thyroidectomy 2024, seeing endocrinology  BMI > 34 - work on efforts to lose weight through health diet and exercise  Asthma - continue albuterol as needed.  If having to use frequently, then recheck to discuss other therapy      Cuinn was seen today for annual exam.  Diagnoses and all orders  for this visit:  Routine general medical examination at a health care facility -     Comprehensive metabolic panel -     CBC -     Lipid panel -     Hemoglobin A1c  Generalized anxiety disorder  Attention deficit hyperactivity disorder (ADHD), unspecified ADHD type  Screening for lipid disorders -     Lipid panel  Screening for diabetes mellitus -     Hemoglobin A1c  S/P thyroidectomy  Need for pneumococcal vaccination -     Pneumococcal polysaccharide  vaccine 23-valent greater than or equal to 2yo subcutaneous/IM  Intermittent asthma without complication, unspecified asthma severity  BMI 34.0-34.9,adult  Other orders -     albuterol (VENTOLIN HFA) 108 (90 Base) MCG/ACT inhaler; Inhale 2 puffs into the lungs every 6 (six) hours as needed for wheezing or shortness of breath. -     hydrocortisone 2.5 % lotion; Apply 1 application  topically daily as needed (Darkness on face).    Follow-up pending labs, yearly for physical

## 2023-07-02 LAB — COMPREHENSIVE METABOLIC PANEL
ALT: 28 [IU]/L (ref 0–44)
AST: 19 [IU]/L (ref 0–40)
Albumin: 4.7 g/dL (ref 4.1–5.1)
Alkaline Phosphatase: 42 [IU]/L — ABNORMAL LOW (ref 44–121)
BUN/Creatinine Ratio: 8 — ABNORMAL LOW (ref 9–20)
BUN: 10 mg/dL (ref 6–20)
Bilirubin Total: 0.4 mg/dL (ref 0.0–1.2)
CO2: 23 mmol/L (ref 20–29)
Calcium: 9.3 mg/dL (ref 8.7–10.2)
Chloride: 104 mmol/L (ref 96–106)
Creatinine, Ser: 1.21 mg/dL (ref 0.76–1.27)
Globulin, Total: 2.4 g/dL (ref 1.5–4.5)
Glucose: 95 mg/dL (ref 70–99)
Potassium: 4.6 mmol/L (ref 3.5–5.2)
Sodium: 140 mmol/L (ref 134–144)
Total Protein: 7.1 g/dL (ref 6.0–8.5)
eGFR: 80 mL/min/{1.73_m2} (ref >59)

## 2023-07-02 LAB — CBC
Hematocrit: 49 % (ref 37.5–51.0)
Hemoglobin: 16.4 g/dL (ref 13.0–17.7)
MCH: 27.7 pg (ref 26.6–33.0)
MCHC: 33.5 g/dL (ref 31.5–35.7)
MCV: 83 fL (ref 79–97)
Platelets: 301 10*3/uL (ref 150–450)
RBC: 5.91 x10E6/uL — ABNORMAL HIGH (ref 4.14–5.80)
RDW: 13.4 % (ref 11.6–15.4)
WBC: 4.5 10*3/uL (ref 3.4–10.8)

## 2023-07-02 LAB — HEMOGLOBIN A1C
Est. average glucose Bld gHb Est-mCnc: 108 mg/dL
Hgb A1c MFr Bld: 5.4 % (ref 4.8–5.6)

## 2023-07-02 LAB — LIPID PANEL
Chol/HDL Ratio: 4.7 {ratio} (ref 0.0–5.0)
Cholesterol, Total: 223 mg/dL — ABNORMAL HIGH (ref 100–199)
HDL: 47 mg/dL (ref >39)
LDL Chol Calc (NIH): 151 mg/dL — ABNORMAL HIGH (ref 0–99)
Triglycerides: 138 mg/dL (ref 0–149)
VLDL Cholesterol Cal: 25 mg/dL (ref 5–40)

## 2023-07-02 NOTE — Progress Notes (Signed)
Results sent through MyChart

## 2023-09-23 ENCOUNTER — Ambulatory Visit (INDEPENDENT_AMBULATORY_CARE_PROVIDER_SITE_OTHER): Payer: MEDICAID | Admitting: Medical

## 2023-09-23 ENCOUNTER — Encounter: Payer: Self-pay | Admitting: Medical

## 2023-09-23 VITALS — BP 122/86 | HR 66 | Ht 74.0 in | Wt 271.4 lb

## 2023-09-23 DIAGNOSIS — M2142 Flat foot [pes planus] (acquired), left foot: Secondary | ICD-10-CM | POA: Diagnosis not present

## 2023-09-23 DIAGNOSIS — M722 Plantar fascial fibromatosis: Secondary | ICD-10-CM | POA: Diagnosis not present

## 2023-09-23 DIAGNOSIS — M2141 Flat foot [pes planus] (acquired), right foot: Secondary | ICD-10-CM | POA: Diagnosis not present

## 2023-09-23 NOTE — Progress Notes (Signed)
 Subjective:  [name] is a 38 y.o. male who presents for Chief Complaint  Patient presents with   Foot Pain   Knee Pain    Foot pain B/L. Patient states that he is "flat footed," and would like referral to podiatrist.      Here for c/o foot pain.  Is flat footed.  Feet are on fire at times.  He gets pain in the balls of both feet.  Been having this problem for years.  He has used inserts in his shoes at times but does not have any currently.  Its become a more frequent problem of late.  No recent trauma, injury, or fall.  He would like referral to podiatrist.  He also wants some contact information for local eye doctors.  No other aggravating or relieving factors.    No other c/o.  Past Medical History:  Diagnosis Date   ADD (attention deficit disorder)    Depression    Gastritis 03/2015   EGD Dr. Wayna Hails   GERD (gastroesophageal reflux disease)    H/O echocardiogram 10/2007   normal stress echo, Dr. Janelle Mediate   Learning disability    Obesity    Psychotic disorder with hallucinations (HCC)    Sleep apnea    Thyroid  nodule    Tinea corporis    Wears glasses    Current Outpatient Medications on File Prior to Visit  Medication Sig Dispense Refill   albuterol  (VENTOLIN  HFA) 108 (90 Base) MCG/ACT inhaler Inhale 2 puffs into the lungs every 6 (six) hours as needed for wheezing or shortness of breath. 8 g 2   amphetamine -dextroamphetamine  (ADDERALL) 30 MG tablet Take 1 tablet by mouth daily. 60 tablet 0   famotidine  (PEPCID ) 40 MG tablet Take 1 tablet (40 mg total) by mouth at bedtime. 90 tablet 3   hydrocortisone  2.5 % lotion Apply 1 application  topically daily as needed (Darkness on face). 59 mL 1   ketoconazole  (NIZORAL ) 2 % shampoo APPLY TOPICALLY 2 TIMES A WEEK 120 mL 2   levothyroxine  (SYNTHROID ) 137 MCG tablet Take 1 tablet (137 mcg total) by mouth daily before breakfast. 90 tablet 3   Multiple Vitamins-Minerals (MULTIVITAMIN WITH MINERALS) tablet Take 1 tablet  by mouth daily. Vita fusion     omeprazole  (PRILOSEC) 40 MG capsule Take 1 capsule (40 mg total) by mouth daily. 90 capsule 3   No current facility-administered medications on file prior to visit.   Past Surgical History:  Procedure Laterality Date   ESOPHAGOGASTRODUODENOSCOPY  03/2015   gastritis; Dr. Wayna Hails   THYROIDECTOMY Bilateral 09/12/2022   Procedure: TOTAL THYROIDECTOMY;  Surgeon: Shela Derby, MD;  Location: [facility] OR;  Service: General;  Laterality: Bilateral;  RNFA     The following portions of the patient's history were reviewed and updated as appropriate: allergies, current medications, past family history, past medical history, past social history, past surgical history and problem list.  ROS Otherwise as in subjective above     Objective: BP 122/86 (BP Location: Right Arm, Cuff Size: Normal)   Pulse 66   Ht 6\' 2"  (1.88 m)   Wt 271 lb 6.4 oz (123.1 kg)   SpO2 98%   BMI 34.85 kg/m   General appearance: alert, no distress, well developed, well nourished Flatfeet bilaterally, tender throughout the plantar fascia in the bottom of the feet, mild bunion on the right great toe MTP, otherwise feet are unremarkable, legs otherwise unremarkable Pulses: 2+ radial pulses, 2+ pedal pulses, normal cap  refill Ext: no edema Feet neurovascularly intact   Assessment: Encounter Diagnoses  Name Primary?   Flat feet, bilateral Yes   Plantar fasciitis      Plan: Discussed exam findings, symptoms, and etiology appears to be plantar fascitis and pes planus.  Discussed diagnosis, treatment recommendations, and follow up.     Plantar fascitis Every morning try doing a tennis ball massage with feet on the floor and towel stretch behind the ball of the foot to stretch the plantar fascia Order plantar fascitis night time 90 degree splints online, through [employer] for example.   They are typically about $25 each Avoid going barefoot since your feet flatten out without good arch  support I recommend you use arch support shoe inserts.   This will help support the plantar fascia and arches You can use over the counter pain reliever for worse pain F/u with podiatry as requested  I also gave him contact info for local eye doctors as his request    [name] was seen today for foot pain and knee pain.  Diagnoses and all orders for this visit:  Flat feet, bilateral -     Ambulatory referral to Podiatry  Plantar fasciitis -     Ambulatory referral to Podiatry    Follow up: pending referral

## 2023-09-23 NOTE — Patient Instructions (Signed)
 Recommendations:  Plantar fascitis Every morning try doing a tennis ball massage with feet on the floor and towel stretch behind the ball of the foot to stretch the plantar fascia Consider using plantar fascitis night time 90 degree splints or plantar fasci sleeve online, through [employer] for example.  They are typically about $25-$30 each Avoid going barefoot since your feet flatten out without good arch support I recommend you use arch support shoe INSERTS.   This will help support the plantar fascia and arches You can use over the counter pain reliever for worse pain    Plantar Fasciitis Plantar fasciitis is a painful foot condition that affects the heel. It occurs when the band of tissue that connects the toes to the heel bone (plantar fascia) becomes irritated. This can happen after exercising too much or doing other repetitive activities (overuse injury). The pain from plantar fasciitis can range from mild irritation to severe pain that makes it difficult for you to walk or move. The pain is usually worse in the morning or after you have been sitting or lying down for a while. What are the causes? This condition may be caused by: Standing for long periods of time. Wearing shoes that do not fit. Doing high-impact activities, including running, aerobics, and ballet. Being overweight. Having an abnormal way of walking (gait). Having tight calf muscles. Having high arches in your feet. Starting a new athletic activity.  What are the signs or symptoms? The main symptom of this condition is heel pain. Other symptoms include: Pain that gets worse after activity or exercise. Pain that is worse in the morning or after resting. Pain that goes away after you walk for a few minutes.  How is this diagnosed? This condition may be diagnosed based on your signs and symptoms. Your health care provider will also do a physical exam to check for: A tender area on the bottom of your foot. A high arch  in your foot. Pain when you move your foot. Difficulty moving your foot.  You may also need to have imaging studies to confirm the diagnosis. These can include: X-rays. Ultrasound. MRI.  How is this treated? Treatment for plantar fasciitis depends on the severity of the condition. Your treatment may include: Rest, ice, and over-the-counter pain medicines to manage your pain. Exercises to stretch your calves and your plantar fascia. A splint that holds your foot in a stretched, upward position while you sleep (night splint). Physical therapy to relieve symptoms and prevent problems in the future. Cortisone injections to relieve severe pain. Extracorporeal shock wave therapy (ESWT) to stimulate damaged plantar fascia with electrical impulses. It is often used as a last resort before surgery. Surgery, if other treatments have not worked after 12 months.  Follow these instructions at home: Take medicines only as directed by your health care provider. Avoid activities that cause pain. Roll the bottom of your foot over a bag of ice or a bottle of cold water. Do this for 20 minutes, 3-4 times a day. Perform simple stretches as directed by your health care provider. Try wearing athletic shoes with air-sole or gel-sole cushions or soft shoe inserts. Wear a night splint while sleeping, if directed by your health care provider. Keep all follow-up appointments with your health care provider. How is this prevented? Do not perform exercises or activities that cause heel pain. Consider finding low-impact activities if you continue to have problems. Lose weight if you need to. The best way to prevent plantar fasciitis  is to avoid the activities that aggravate your plantar fascia. Contact a health care provider if: Your symptoms do not go away after treatment with home care measures. Your pain gets worse. Your pain affects your ability to move or do your daily activities. This information is not  intended to replace advice given to you by your health care provider. Make sure you discuss any questions you have with your health care provider. Document Released: 01/29/2001 Document Revised: 10/09/2015 Document Reviewed: 03/16/2014 Elsevier Interactive Patient Education  [employer].    Optometrists nearby  [facility] [address] Alana Hoyle [place], [place] [address] [phone]  Dr. Cari Char [address], [place], [place] [address] [phone]  [facility] [address] Geraldene Kleine [place], [place] [address] [phone]  [facility], Dr. Alto Atta [address], [place], [place] [address] [phone]

## 2023-10-03 ENCOUNTER — Ambulatory Visit (INDEPENDENT_AMBULATORY_CARE_PROVIDER_SITE_OTHER): Payer: MEDICAID

## 2023-10-03 ENCOUNTER — Ambulatory Visit (INDEPENDENT_AMBULATORY_CARE_PROVIDER_SITE_OTHER): Payer: MEDICAID | Admitting: Podiatry

## 2023-10-03 ENCOUNTER — Encounter: Payer: Self-pay | Admitting: Podiatry

## 2023-10-03 DIAGNOSIS — M2141 Flat foot [pes planus] (acquired), right foot: Secondary | ICD-10-CM

## 2023-10-03 DIAGNOSIS — M722 Plantar fascial fibromatosis: Secondary | ICD-10-CM | POA: Diagnosis not present

## 2023-10-03 DIAGNOSIS — M2142 Flat foot [pes planus] (acquired), left foot: Secondary | ICD-10-CM | POA: Diagnosis not present

## 2023-10-03 DIAGNOSIS — M7751 Other enthesopathy of right foot: Secondary | ICD-10-CM

## 2023-10-03 DIAGNOSIS — M7752 Other enthesopathy of left foot: Secondary | ICD-10-CM

## 2023-10-03 DIAGNOSIS — G5753 Tarsal tunnel syndrome, bilateral lower limbs: Secondary | ICD-10-CM

## 2023-10-03 MED ORDER — MELOXICAM 15 MG PO TABS
15.0000 mg | ORAL_TABLET | Freq: Every day | ORAL | 0 refills | Status: DC
Start: 2023-10-03 — End: 2024-04-14

## 2023-10-03 MED ORDER — TRIAMCINOLONE ACETONIDE 10 MG/ML IJ SUSP
10.0000 mg | Freq: Once | INTRAMUSCULAR | Status: AC
Start: 2023-10-03 — End: 2023-10-03
  Administered 2023-10-03: 10 mg

## 2023-10-03 NOTE — Patient Instructions (Signed)

## 2023-10-03 NOTE — Progress Notes (Signed)
 Chief Complaint  Patient presents with   Foot Pain    RM#2 Patient presents today with bilateral foot pain ongoing many years has tried exercises  and has support ankle compressions or arch support.    HPI: 38 y.o. male presenting today with c/o pain in the bottom of the bilateral heel.  States pain gets pretty severe especially after periods of rest and being on his feet for long periods of time.  He notes that he has had flatfeet and has had foot pain for quite some time.  Past Medical History:  Diagnosis Date   ADD (attention deficit disorder)    Depression    Gastritis 03/2015   EGD Dr. Wayna Hails   GERD (gastroesophageal reflux disease)    H/O echocardiogram 10/2007   normal stress echo, Dr. Janelle Mediate   Learning disability    Obesity    Psychotic disorder with hallucinations (HCC)    Sleep apnea    Thyroid  nodule    Tinea corporis    Wears glasses    Past Surgical History:  Procedure Laterality Date   ESOPHAGOGASTRODUODENOSCOPY  03/2015   gastritis; Dr. Wayna Hails   THYROIDECTOMY Bilateral 09/12/2022   Procedure: TOTAL THYROIDECTOMY;  Surgeon: Shela Derby, MD;  Location: [facility] OR;  Service: General;  Laterality: Bilateral;  RNFA   Allergies  Allergen Reactions   Grass Extracts [Gramineae Pollens] Swelling     Physical Exam: General: The patient is alert and oriented x3 in no acute distress.  Dermatology:  No ecchymosis, erythema, or edema bilateral.  No open lesions.    Vascular: Palpable pedal pulses bilaterally. Capillary refill within normal limits.  No appreciable edema.    Neurological: Epicritic sensation is intact.  Does have positive Tinel's sign bilaterally.  Musculoskeletal Exam:  There is pain on palpation of the plantarmedial & plantarcentral aspect of bilateral heel.  No gaps or nodules within the plantar fascia.  Positive Windlass mechanism bilateral.  Antalgic gait noted with first few steps upon standing.  No pain on palpation of achilles  tendon bilateral.  Ankle df less than 10 degrees with knee extended b/l.  Does have bilateral pes planus with significant hindfoot eversion with weightbearing.  This is reducible.  Does have some pain with double heel rise.  Right foot there is some pain at PT insertion, no pain on palpation of PT tendon otherwise.  Radiographic Exam: Left and right foot 3 views weightbearing 09/30/2023 Normal osseous mineralization. Joint spaces preserved.  No fractures noted.  Pes planus foot type noted bilaterally with enlarged navicular tuberosity left foot and right foot there is slight MTP joint sag with break in Meary's angle right greater than left with some first ray elevatus here.  Assessment/Plan of Care: 1. Pes planus of both feet   2. Plantar fasciitis, bilateral   3. Tarsal tunnel syndrome of both lower extremities     Meds ordered this encounter  Medications   triamcinolone  acetonide (KENALOG ) 10 MG/ML injection 10 mg   meloxicam  (MOBIC ) 15 MG tablet    Sig: Take 1 tablet (15 mg total) by mouth daily.    Dispense:  30 tablet    Refill:  0   None  -Reviewed etiology of plantar fasciitis with patient.  Left and right foot Radiographs reviewed with patient.  Discussed treatment options with patient today, including cortisone injection, NSAID course of treatment, stretching exercises, physical therapy, use of night splint, rest, icing the heel, arch supports/orthotics, and supportive shoe gear.    The  plantar fasciitis pain is aggravated by pes planus foot type.  Believe this is also causing tarsal tunnel symptoms due to increase strain on medial structures with the significant hindfoot inversion.  I discussed good brands of over-the-counter inserts for patient to purchase as well as good supportive shoe gear.  He may benefit from custom orthotics long-term if he can get improvement to his pain.  Did discuss that plantar fasciitis is an overuse injury and chronic cases such as his when dealing with  this for months towards a year, there are times when cam boot immobilization or physical therapy is needed.  Sending patient course of oral meloxicam  as well 15 mg once a day.  Stretching regimen discussed at length with patient.  Instructed him to perform Achilles stretching and plantar fascia stretching 2-3 times a day, 3 sets of 10-15 reps holding stretches for 30 seconds each.  Patient was fitted for bilateral plantar fascial braces to be worn with weightbearing activity today. This will take strain off of plantar fascia during weight bearing activity.  We will have patient follow-up in approximately 2 weeks to monitor for signs of improvement  Procedure: Bilateral plantar fascia injection With the patient's verbal consent, a corticosteroid injection was administered to the bilateral heel, consisting of a mixture of 1% lidocaine  plain, 0.5% Sensorcaine  plain, and 1 cc of Kenalog -10 for a total of 2 cc administered to each heel.  A Band-aid was applied.  Patient tolerated this well.  Return in about 2 weeks (around 10/17/2023) for Plantar Fasciitis/tarsal tunnel.   Anasha Perfecto L. Lunda Salines, AACFAS [facility]     [address] [place], [place] [address]                Office [phone]  Fax [phone]

## 2023-12-30 ENCOUNTER — Encounter: Payer: Self-pay | Admitting: Internal Medicine

## 2023-12-30 ENCOUNTER — Ambulatory Visit (INDEPENDENT_AMBULATORY_CARE_PROVIDER_SITE_OTHER): Payer: MEDICAID | Admitting: Internal Medicine

## 2023-12-30 VITALS — BP 122/84 | HR 64 | Ht 74.0 in | Wt 263.0 lb

## 2023-12-30 DIAGNOSIS — E890 Postprocedural hypothyroidism: Secondary | ICD-10-CM | POA: Diagnosis not present

## 2023-12-30 LAB — TSH: TSH: 0.94 m[IU]/L (ref 0.40–4.50)

## 2023-12-30 NOTE — Patient Instructions (Signed)

## 2023-12-30 NOTE — Patient Instructions (Signed)

## 2023-12-30 NOTE — Progress Notes (Signed)
 Name: [name]  MRN/ DOB: [id number], 24-Aug-1985    Age/ Sex: 38 y.o., male    PCP: Bulah Alm GORMAN DEVONNA   Reason for Endocrinology Evaluation: MNG     Date of Initial Endocrinology Evaluation: 06/27/2022    HPI: [name] is a 38 y.o. male with a past medical history of MNG, OSA, Dyslipidemia and ADD. The patient presented for initial endocrinology clinic visit on 06/27/2022 for consultative assistance with his MNG.   Patient has been diagnosed with multinodular goiter based on thyroid  ultrasound 05/22/2022, this was triggered based on thyromegaly that was noted on physical exam.   No XRT   Denies FH of thyroid  disease    Thyroid  ultrasound 05/2022 revealed multinodular goiter, he is s/p benign FNA of the left 2.6 cm nodule, FNA of right nodule revealed follicular lesion of undetermined significance (Bethesda category III Patient underwent total thyroidectomy 08/2022 with pathology report consistent with NIFTP, 5.5  cm, involving inferior right pole.   SUBJECTIVE:    Today (12/30/23):  [name] is here for a follow up for postoperative hypothyroidism   Patient has been noted weight loss He notes localized swelling at the site of the scar tissue Denies palpitations Denies tremors No constipation or diarrhea  Levothyroxine  137 mcg daily   HISTORY:  Past Medical History:  Past Medical History:  Diagnosis Date   ADD (attention deficit disorder)    Depression    Gastritis 03/2015   EGD Dr. Lurlean Buddy   GERD (gastroesophageal reflux disease)    H/O echocardiogram 10/2007   normal stress echo, Dr. Maude Emmer   Learning disability    Obesity    Psychotic disorder with hallucinations (HCC)    Sleep apnea    Thyroid  nodule    Tinea corporis    Wears glasses    Past Surgical History:  Past Surgical History:  Procedure Laterality Date   ESOPHAGOGASTRODUODENOSCOPY  03/2015   gastritis; Dr. Lurlean Buddy   THYROIDECTOMY Bilateral  09/12/2022   Procedure: TOTAL THYROIDECTOMY;  Surgeon: Rubin Calamity, MD;  Location: [facility] OR;  Service: General;  Laterality: Bilateral;  RNFA    Social History:  reports that he has never smoked. He has never used smokeless tobacco. He reports that he does not drink alcohol and does not use drugs. Family History: family history includes Cancer in his paternal grandmother; Cancer (age of onset: 58) in his father; Deep vein thrombosis in his mother; Diabetes in his paternal aunt; Heart disease in his paternal uncle; Hypertension in his mother; Multiple sclerosis in his father; Prostate cancer in his paternal grandfather.   HOME MEDICATIONS: Allergies as of 12/30/2023       Reactions   Grass Extracts [gramineae Pollens] Swelling        Medication List        Accurate as of December 30, 2023 12:53 PM. If you have any questions, ask your nurse or doctor.          albuterol  108 (90 Base) MCG/ACT inhaler Commonly known as: VENTOLIN  HFA Inhale 2 puffs into the lungs every 6 (six) hours as needed for wheezing or shortness of breath.   amphetamine -dextroamphetamine  30 MG tablet Commonly known as: Adderall Take 1 tablet by mouth daily.   famotidine  40 MG tablet Commonly known as: PEPCID  Take 1 tablet (40 mg total) by mouth at bedtime.   hydrocortisone  2.5 % lotion Apply 1 application  topically daily as needed (Darkness on face).   ketoconazole  2 %  shampoo Commonly known as: NIZORAL  APPLY TOPICALLY 2 TIMES A WEEK   levothyroxine  137 MCG tablet Commonly known as: SYNTHROID  Take 1 tablet (137 mcg total) by mouth daily before breakfast.   meloxicam  15 MG tablet Commonly known as: MOBIC  Take 1 tablet (15 mg total) by mouth daily.   multivitamin with minerals tablet Take 1 tablet by mouth daily. Vita fusion   omeprazole  40 MG capsule Commonly known as: PRILOSEC Take 1 capsule (40 mg total) by mouth daily.          REVIEW OF SYSTEMS: A comprehensive ROS was conducted  with the patient and is negative except as per HPI     OBJECTIVE:  VS: BP 122/84 (BP Location: Left Arm, Patient Position: Sitting, Cuff Size: Normal)   Pulse 64   Ht 6' 2 (1.88 m)   Wt 263 lb (119.3 kg)   SpO2 95%   BMI 33.77 kg/m    Wt Readings from Last 3 Encounters:  12/30/23 263 lb (119.3 kg)  09/23/23 271 lb 6.4 oz (123.1 kg)  07/01/23 271 lb (122.9 kg)     EXAM: General: Pt appears well and is in NAD  Neck: General: Supple without adenopathy. Thyroid : No goiter or nodules appreciated.   Lungs: Clear with good BS  Heart: Auscultation: RRR.  Abdomen: soft, nontender  Extremities:  BL LE: No pretibial edema  Mental Status: Judgment, insight: Intact Orientation: Oriented to time, place, and person Mood and affect: No depression, anxiety, or agitation     DATA REVIEWED:    Latest Reference Range & Units 12/30/23 13:02  TSH 0.40 - 4.50 mIU/L 0.94     FNA right nodule 05/22/2022   Clinical History: Right mid thyroid  nodule 4.6 cm Specimen Submitted:  A. THYROID , RIGHT, FINE NEEDLE ASPIRATION:   FINAL MICROSCOPIC DIAGNOSIS: - Follicular lesion of undetermined significance (Bethesda category III)    Afirma suspicious     Thyroid  Pathology report 09/12/2022  FINAL MICROSCOPIC DIAGNOSIS:   A. 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), 5.5 cm, involving inferior right pole, see comment  - Tumor is confined to thyroid  gland - no evidence of extrathyroidal  extension  - Negative for lymphovascular or angioinvasion  - Resection margins are negative for tumor   ASSESSMENT/PLAN/RECOMMENDATIONS:   Postoperative Hypothyroidism   -Patient is clinically euthyroid - Pathology report consistent with NIFTP -   Medication Continue levothyroxine  137 mcg daily   Follow-up in 1 yr      Signed electronically by: Stefano Redgie Butts, MD  [facility]  [facility] [address] [place] [place], [place] [address] Phone: [phone] FAX: [phone]   CC: Bulah Alm RAMAN, PA-C [address] [place] [address] Phone: [phone] Fax: [phone]   Return to Endocrinology clinic as below: Future Appointments  Date Time Provider Department Center  12/30/2023  1:00 PM Morrison Mcbryar, Donell Redgie, MD [facility] None  07/05/2024  2:00 PM Tysinger, Alm RAMAN, PA-C [facility]

## 2023-12-31 ENCOUNTER — Ambulatory Visit: Payer: Self-pay | Admitting: Internal Medicine

## 2023-12-31 MED ORDER — LEVOTHYROXINE SODIUM 137 MCG PO TABS: 137.0000 ug | ORAL_TABLET | Freq: Every day | ORAL | 3 refills | Status: AC

## 2024-02-05 ENCOUNTER — Telehealth: Payer: Self-pay | Admitting: Medical

## 2024-02-05 ENCOUNTER — Other Ambulatory Visit: Payer: Self-pay | Admitting: Medical

## 2024-02-05 MED ORDER — AMPHETAMINE-DEXTROAMPHETAMINE 30 MG PO TABS: 30.0000 mg | ORAL_TABLET | Freq: Every day | ORAL | 0 refills | Status: AC

## 2024-02-05 NOTE — Telephone Encounter (Signed)
 Copied from CRM [id number]. Topic: Clinical - Medication Refill >> Feb 05, 2024 11:09 AM Willma R wrote: Medication: amphetamine -dextroamphetamine  (ADDERALL) 30 MG tablet  Has the patient contacted their pharmacy? Yes, call dr  This is the patient's preferred pharmacy:  [facility] [address] [place] - [address] [place] [address] Phone: [phone] Fax: [phone]  Is this the correct pharmacy for this prescription? Yes If no, delete pharmacy and type the correct one.   Has the prescription been filled recently? No  Is the patient out of the medication? Yes  Has the patient been seen for an appointment in the last year OR does the patient have an upcoming appointment? Yes  Can we respond through MyChart? Yes  Agent: Please be advised that Rx refills may take up to 3 business days. We ask that you follow-up with your pharmacy.

## 2024-02-05 NOTE — Telephone Encounter (Signed)
 Copied from CRM [id number]. Topic: General - Other >> Feb 05, 2024 11:11 AM Willma SAUNDERS wrote: Reason for CRM: Patient states that PA Tysinger has written him a letter in the past stating that he is a dependant under his father. Is requesting a new letter to submit with his taxes.  Gerrett can be reached at [phone]

## 2024-02-06 NOTE — Telephone Encounter (Signed)
 Letter in Epic dated 02/14/21, sent copy back in folder

## 2024-02-09 ENCOUNTER — Encounter: Payer: Self-pay | Admitting: Medical

## 2024-02-09 ENCOUNTER — Other Ambulatory Visit: Payer: Self-pay | Admitting: Medical

## 2024-03-12 ENCOUNTER — Telehealth: Payer: Self-pay | Admitting: Internal Medicine

## 2024-03-12 NOTE — Telephone Encounter (Signed)
 Copied from CRM [id number]. Topic: Referral - Request for Referral >> Mar 12, 2024  9:09 AM Leonette SQUIBB wrote: Did the patient discuss referral with their provider in the last year? Yes (If No - schedule appointment) (If Yes - send message)  Appointment offered? No  Type of order/referral and detailed reason for visit: Check up for his skin  Preference of office, provider, location:  Dermatology  [facility]  If referral order, have you been seen by this specialty before? Yes (If Yes, this issue or another issue? When? Where?  Can we respond through MyChart? No

## 2024-04-14 ENCOUNTER — Ambulatory Visit: Payer: MEDICAID | Admitting: Family Medicine

## 2024-04-14 ENCOUNTER — Ambulatory Visit
Admission: RE | Admit: 2024-04-14 | Discharge: 2024-04-14 | Disposition: A | Discharge status: Home / Self Care | Payer: MEDICAID | Source: Ambulatory Visit | Attending: Family Medicine | Admitting: Family Medicine

## 2024-04-14 VITALS — BP 122/80 | HR 58 | Wt 258.0 lb

## 2024-04-14 DIAGNOSIS — M75101 Unspecified rotator cuff tear or rupture of right shoulder, not specified as traumatic: Secondary | ICD-10-CM

## 2024-04-14 DIAGNOSIS — S46211A Strain of muscle, fascia and tendon of other parts of biceps, right arm, initial encounter: Secondary | ICD-10-CM | POA: Diagnosis not present

## 2024-04-14 MED ORDER — MELOXICAM 15 MG PO TABS
15.0000 mg | ORAL_TABLET | Freq: Every day | ORAL | 0 refills | Status: AC
Start: 2024-04-14 — End: ?

## 2024-04-14 NOTE — Progress Notes (Signed)
   Name: [name]   Date of Visit: 04/14/24   Date of last visit with me: Visit date not found   CHIEF COMPLAINT:  Chief Complaint  Patient presents with   Acute Visit    Possible dislocated right shoulder x 1 week ago. Was working out and then lifted a water and shoulder gave out       HPI:  Discussed the use of AI scribe software for clinical note transcription with the patient, who gave verbal consent to proceed.  History of Present Illness   [name] is a 38 year old male who presents with shoulder pain after lifting a case of water.  He has been experiencing shoulder pain for the past week, which began while lifting a case of water during a workout. The pain is described as a tearing sensation with arm movement, particularly during abduction. No popping sensation was felt at the onset of the pain.  The pain is primarily localized to the shoulder and is aggravated by certain movements, especially abduction. Driving causes discomfort, prompting him to use his other hand. He attempted to use icy hot for relief, but it was ineffective.  He is not currently taking any medications for the pain but recalls being prescribed meloxicam  in the past and is familiar with its use. He has not taken any pain medication since the onset of this issue.  He reports significant pain with abduction and some discomfort while driving.         OBJECTIVE:       07/01/2023    3:17 PM  Depression screen PHQ 2/9  Decreased Interest 0  Down, Depressed, Hopeless 0  PHQ - 2 Score 0     BP Readings from Last 3 Encounters:  04/14/24 122/80  12/30/23 122/84  09/23/23 122/86    BP 122/80   Pulse (!) 58   Wt 258 lb (117 kg)   SpO2 99%   BMI 33.13 kg/m    Physical Exam   MUSCULOSKELETAL: Pain with shoulder abduction. Positive empty can test. Pain with resisted shoulder abduction. Mild pain with resisted external rotation.      Physical Exam  ASSESSMENT/PLAN:    Assessment & Plan Rotator cuff syndrome of right shoulder  Strain of biceps tendon, right, initial encounter    Assessment and Plan    Acute right shoulder rotator cuff tear Likely partial rotator cuff tear, possibly involving biceps tendon. Acute phase with significant pain and swelling. Differential includes rotator cuff tear and biceps tendon tear. - Ordered right shoulder x-rays at [facility] to rule out bony injury. - Prescribed meloxicam  with food for 2-3 weeks. - Advised against exercise, encouraged gentle movement to maintain range of motion. - Instructed to apply ice to shoulder twice daily for 20 minutes. - Scheduled follow-up in 2 weeks to assess progress and consider ultrasound if needed.      Biceps tendonitis  - Noted pain over the Biceps tendon in the groove.  - Strength is slightly decreased with flexion of the elbow compared to the left. - Low suspicion for possible tear but will reevaluate with ultrasound after x-rays.   [name] A. Vita MD [facility]

## 2024-04-14 NOTE — Patient Instructions (Signed)
 Please go get your xrays done at [facility]. You do not need to make an appointment. You can just show up.   Address: [address] [place], [place], [place] [address]

## 2024-04-19 ENCOUNTER — Telehealth: Payer: Self-pay | Admitting: Internal Medicine

## 2024-04-19 NOTE — Telephone Encounter (Signed)
 Results have not come back yet   Copied from CRM #[id number]. Topic: Clinical - Lab/Test Results >> Apr 19, 2024  2:06 PM Victoria A wrote: Reason for CRM: Patient would like results when read for Rotator Cuff -please contact

## 2024-04-19 NOTE — Telephone Encounter (Signed)
 Left message for pt to call back to schedule ultrasound for next week

## 2024-04-23 ENCOUNTER — Ambulatory Visit: Payer: MEDICAID | Admitting: Family Medicine

## 2024-04-23 VITALS — BP 134/84 | HR 67 | Wt 262.2 lb

## 2024-04-23 DIAGNOSIS — M25511 Pain in right shoulder: Secondary | ICD-10-CM | POA: Diagnosis not present

## 2024-04-23 MED ORDER — METHYLPREDNISOLONE ACETATE 40 MG/ML IJ SUSP
40.0000 mg | Freq: Once | INTRAMUSCULAR | Status: AC
  Administered 2024-04-23: 40 mg via INTRAMUSCULAR

## 2024-04-23 MED ORDER — LIDOCAINE HCL 1 % IJ SOLN
5.0000 mL | Freq: Once | INTRAMUSCULAR | Status: AC
  Administered 2024-04-23: 5 mL via INTRADERMAL

## 2024-04-23 NOTE — Progress Notes (Signed)
 Name: [name]   Date of Visit: 04/23/24   Date of last visit with me: 04/14/2024   CHIEF COMPLAINT:  Chief Complaint  Patient presents with   Acute Visit    Shoulder ultrasound.        HPI:  Discussed the use of AI scribe software for clinical note transcription with the patient, who gave verbal consent to proceed.  History of Present Illness [name] is a 38 year old male who presents with right shoulder pain and limited range of motion.  He has been experiencing right shoulder pain primarily on the top of the shoulder for approximately two weeks. The pain is tender and exacerbated by certain movements, particularly when lifting the arm or moving it in specific directions. The pain is improving but still persists.  The pain began after working out. He has not been performing any exercises since the onset of pain. He reports tenderness at the top of the shoulder and confirms that certain movements exacerbate the pain.     OBJECTIVE:       07/01/2023    3:17 PM  Depression screen PHQ 2/9  Decreased Interest 0  Down, Depressed, Hopeless 0  PHQ - 2 Score 0     BP Readings from Last 3 Encounters:  04/23/24 134/84  04/14/24 122/80  12/30/23 122/84    BP 134/84   Pulse 67   Wt 262 lb 3.2 oz (118.9 kg)   SpO2 97%   BMI 33.66 kg/m    Physical Exam   Physical Exam   TTP over AC joint, decreased ROM with flexion. No TTP over biceps groove. ROM improved compared to prior with abduction of the shoulder.  ASSESSMENT/PLAN:   Assessment & Plan    Assessment and Plan Assessment & Plan Right supraspinatus cuff tear with AC joint pain and bursal inflammation Partial tear of the right supraspinatus with AC joint and bursal inflammation. Symptoms improving but still painful. -Patient has significant tenderness to palpation over the [facility] joint as well as pain with Neer's and overhead movements.  Given significant localized tenderness, we discussed  steroid injection.  Patient is agreeable a month ahead.  Patient did note some improvement after injection was administered this is likely related to the numbing medicine. - We will see patient back in 6 weeks for reevaluation, I suspect that he will have some improvement with strengthening however he may still need an MRI in 6 weeks. - Administered needle steroid injection to [facility] joint. - Provided home exercises for rotator cuff rehabilitation. - Scheduled follow-up in six weeks.   PROCEDURE: Risks & benefits of riught shoulder u/s guided AC joint injection reviewed. Consent obtained. Time-out completed. Patient prepped and draped in the normal fashion. Musculoskeletal ultrasound used to identify appropriate anatomy. U/S exam showing mushroom sign and some effusion.   After identifying appropriate anatomy, patient positioned & area cleansed with chlorhexidine . Ethyl chloride spray used to anesthetize the skin. After ensuring adequate anesthesia a solution of 1 mL 1% lidocaine  with 1 mL methylprednisolone  (Depo-medrol ) 40mg /mL injected into the right AC joint using a 22-gauge 1.5-inch needle via anterior approach under ultrasound guidance. Needle well-visualized in the joint. Images saved. Patient tolerated procedure well without any complications. Area covered with adhesive bandage. Post-procedure care reviewed. All questions answered.   I personally spent a total of 38 minutes in the care of the patient today including preparing to see the patient, getting/reviewing separately obtained history, performing a medically appropriate exam/evaluation, counseling and  educating, placing orders, referring and communicating with other health care professionals, documenting clinical information in the EHR, independently interpreting results, communicating results, and coordinating care.  Dacia Capers A. Vita MD [facility]

## 2024-04-23 NOTE — Addendum Note (Signed)
 Addended by: LATTIE CARLO BROCKS on: 04/23/2024 12:53 PM   Modules accepted: Orders

## 2024-06-04 ENCOUNTER — Ambulatory Visit: Payer: MEDICAID | Admitting: Family Medicine

## 2024-06-04 VITALS — BP 108/88 | HR 74 | Wt 258.0 lb

## 2024-06-04 DIAGNOSIS — M25511 Pain in right shoulder: Secondary | ICD-10-CM | POA: Diagnosis not present

## 2024-06-04 DIAGNOSIS — M75101 Unspecified rotator cuff tear or rupture of right shoulder, not specified as traumatic: Secondary | ICD-10-CM

## 2024-06-04 NOTE — Progress Notes (Signed)
" ° °  Name: [name]   Date of Visit: 06/04/24   Date of last visit with me: 04/23/2024   CHIEF COMPLAINT:  Chief Complaint  Patient presents with   Follow-up    6 week follow up on injection, shoulder is doing better doing exercises.        HPI:  Discussed the use of AI scribe software for clinical note transcription with the patient, who gave verbal consent to proceed.  History of Present Illness   [name] is a 39 year old male who presents with difficulty in lifting his arm despite significant improvement in shoulder pain.  He reports significant improvement in his shoulder pain, stating it is 'eighty, ninety percent better' compared to before. He has been performing the exercises as instructed and notes that he can lift his arm, although it remains difficult. He describes the difficulty as a muscle-related issue rather than pain.  He underwent an AC joint injection more than six weeks ago, which he believes alleviated most of his pain. He is no longer taking meloxicam  for pain management.  Despite the improvement in pain, he continues to experience difficulty in lifting his arm fully, stating that it feels like a muscle issue and that it is hard to get it up.         OBJECTIVE:       07/01/2023    3:17 PM  Depression screen PHQ 2/9  Decreased Interest 0  Down, Depressed, Hopeless 0  PHQ - 2 Score 0     BP Readings from Last 3 Encounters:  06/04/24 108/88  04/23/24 134/84  04/14/24 122/80    BP 108/88   Pulse 74   Wt 258 lb (117 kg)   SpO2 97%   BMI 33.13 kg/m    Physical Exam   MUSCULOSKELETAL: Limited range of motion in the shoulder with difficulty elevating the arm. Appears compensation with deltoid to lift arm >100 degrees.      Physical Exam  ASSESSMENT/PLAN:   Assessment & Plan Acute pain of right shoulder [M25.511]  Rotator cuff syndrome of right shoulder    Assessment and Plan    Rotator cuff syndrome of right  shoulder Significant pain improvement post-AC joint injection. Persistent limited range of motion suggests muscle weakness or tear.  Patient has done at least 8 weeks of at home physical therapy.  Pain has improved but given patient's dysfunction with significantly decreased range of motion, patient will need an MRI to evaluate for any suspected - Ordered MRI of right shoulder to assess for possible muscle tear. - Coordinated MRI scheduling at [facility] imaging - Discontinued meloxicam  as pain is well-controlled.         Randon Somera A. Vita MD [facility] "

## 2024-06-08 ENCOUNTER — Encounter: Payer: Self-pay | Admitting: Family Medicine

## 2024-06-16 ENCOUNTER — Other Ambulatory Visit: Payer: MEDICAID

## 2024-06-26 ENCOUNTER — Other Ambulatory Visit: Payer: MEDICAID

## 2024-07-05 ENCOUNTER — Encounter: Payer: MEDICAID | Admitting: Medical

## 2024-12-29 ENCOUNTER — Ambulatory Visit: Payer: MEDICAID | Admitting: Internal Medicine
